# Patient Record
Sex: Male | Born: 1960 | Race: White | Hispanic: No | Marital: Married | Smoking: Never smoker
Health system: Southern US, Community
[De-identification: ages and names within clinical notes are randomized; demographics above are authoritative.]

## PROBLEM LIST (undated history)

## (undated) ENCOUNTER — Inpatient Hospital Stay: Admission: EM | Payer: Self-pay | Source: Home / Self Care

## (undated) DIAGNOSIS — C801 Malignant (primary) neoplasm, unspecified: Secondary | ICD-10-CM

## (undated) HISTORY — PX: CHOLECYSTECTOMY: SHX55

## (undated) HISTORY — PX: HERNIA REPAIR: SHX51

---

## 2002-03-20 ENCOUNTER — Ambulatory Visit (HOSPITAL_COMMUNITY): Admission: RE | Admit: 2002-03-20 | Discharge: 2002-03-20 | Payer: Self-pay | Admitting: Internal Medicine

## 2002-03-20 ENCOUNTER — Encounter: Payer: Self-pay | Admitting: Internal Medicine

## 2002-12-13 ENCOUNTER — Ambulatory Visit (HOSPITAL_BASED_OUTPATIENT_CLINIC_OR_DEPARTMENT_OTHER): Admission: RE | Admit: 2002-12-13 | Discharge: 2002-12-13 | Payer: Self-pay | Admitting: Orthopedic Surgery

## 2004-01-31 ENCOUNTER — Encounter: Admission: RE | Admit: 2004-01-31 | Discharge: 2004-01-31 | Payer: Self-pay | Admitting: Otolaryngology

## 2004-07-25 ENCOUNTER — Encounter: Admission: RE | Admit: 2004-07-25 | Discharge: 2004-07-25 | Payer: Self-pay | Admitting: Occupational Medicine

## 2007-05-20 ENCOUNTER — Emergency Department (HOSPITAL_COMMUNITY): Admission: EM | Admit: 2007-05-20 | Discharge: 2007-05-20 | Payer: Self-pay | Admitting: Emergency Medicine

## 2008-06-08 ENCOUNTER — Inpatient Hospital Stay (HOSPITAL_COMMUNITY): Admission: AC | Admit: 2008-06-08 | Discharge: 2008-06-25 | Payer: Self-pay

## 2008-06-15 ENCOUNTER — Ambulatory Visit: Payer: Self-pay | Admitting: Surgery

## 2008-06-15 ENCOUNTER — Encounter (INDEPENDENT_AMBULATORY_CARE_PROVIDER_SITE_OTHER): Payer: Self-pay | Admitting: General Surgery

## 2008-07-30 ENCOUNTER — Ambulatory Visit (HOSPITAL_COMMUNITY): Admission: RE | Admit: 2008-07-30 | Discharge: 2008-07-30 | Payer: Self-pay | Admitting: Orthopaedic Surgery

## 2008-08-27 ENCOUNTER — Encounter: Admission: RE | Admit: 2008-08-27 | Discharge: 2008-11-25 | Payer: Self-pay | Admitting: Orthopaedic Surgery

## 2008-09-07 ENCOUNTER — Ambulatory Visit (HOSPITAL_COMMUNITY): Admission: RE | Admit: 2008-09-07 | Discharge: 2008-09-08 | Payer: Self-pay | Admitting: Orthopaedic Surgery
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
## 2010-04-27 ENCOUNTER — Encounter: Payer: Self-pay | Admitting: Orthopaedic Surgery

## 2010-07-14 LAB — COMPREHENSIVE METABOLIC PANEL
AST: 33 U/L (ref 0–37)
Albumin: 3.7 g/dL (ref 3.5–5.2)
Alkaline Phosphatase: 149 U/L — ABNORMAL HIGH (ref 39–117)
BUN: 13 mg/dL (ref 6–23)
Chloride: 109 mEq/L (ref 96–112)
GFR calc Af Amer: >60 mL/min (ref >60)
Potassium: 3.9 mEq/L (ref 3.5–5.1)
Total Bilirubin: 0.8 mg/dL (ref 0.3–1.2)
Total Protein: 6.6 g/dL (ref 6.0–8.3)

## 2010-07-14 LAB — DIFFERENTIAL
Basophils Absolute: 0 10*3/uL (ref 0.0–0.1)
Eosinophils Relative: 3 % (ref 0–5)
Lymphocytes Relative: 25 % (ref 12–46)
Monocytes Absolute: 0.5 10*3/uL (ref 0.1–1.0)
Monocytes Relative: 14 % — ABNORMAL HIGH (ref 3–12)
Neutro Abs: 2.3 10*3/uL (ref 1.7–7.7)

## 2010-07-14 LAB — CBC
HCT: 40.6 % (ref 39.0–52.0)
Platelets: 218 10*3/uL (ref 150–400)
RDW: 12.7 % (ref 11.5–15.5)
WBC: 3.9 10*3/uL — ABNORMAL LOW (ref 4.0–10.5)

## 2010-07-14 LAB — APTT: aPTT: 27 seconds (ref 24–37)

## 2010-07-16 LAB — DIFFERENTIAL
Basophils Absolute: 0 10*3/uL (ref 0.0–0.1)
Basophils Relative: 1 % (ref 0–1)
Lymphocytes Relative: 25 % (ref 12–46)
Monocytes Absolute: 0.7 10*3/uL (ref 0.1–1.0)
Neutro Abs: 2.7 10*3/uL (ref 1.7–7.7)
Neutrophils Relative %: 56 % (ref 43–77)

## 2010-07-16 LAB — URINALYSIS, ROUTINE W REFLEX MICROSCOPIC
Bilirubin Urine: NEGATIVE
Ketones, ur: NEGATIVE mg/dL
Nitrite: NEGATIVE
Protein, ur: NEGATIVE mg/dL
pH: 7 (ref 5.0–8.0)

## 2010-07-16 LAB — COMPREHENSIVE METABOLIC PANEL
Alkaline Phosphatase: 130 U/L — ABNORMAL HIGH (ref 39–117)
BUN: 13 mg/dL (ref 6–23)
CO2: 26 mEq/L (ref 19–32)
Chloride: 106 mEq/L (ref 96–112)
Creatinine, Ser: 0.54 mg/dL (ref 0.4–1.5)
GFR calc non Af Amer: >60 mL/min (ref >60)
Glucose, Bld: 99 mg/dL (ref 70–99)
Potassium: 3.8 mEq/L (ref 3.5–5.1)
Total Bilirubin: 0.7 mg/dL (ref 0.3–1.2)

## 2010-07-16 LAB — APTT: aPTT: 27 seconds (ref 24–37)

## 2010-07-16 LAB — CBC
HCT: 43.3 % (ref 39.0–52.0)
Hemoglobin: 14.4 g/dL (ref 13.0–17.0)
MCV: 95.3 fL (ref 78.0–100.0)
WBC: 4.9 10*3/uL (ref 4.0–10.5)

## 2010-07-16 LAB — PROTIME-INR
INR: 1.1 (ref 0.00–1.49)
Prothrombin Time: 15 seconds (ref 11.6–15.2)

## 2010-07-17 LAB — TYPE AND SCREEN: ABO/RH(D): O POS

## 2010-07-17 LAB — CBC
HCT: 18.9 % — ABNORMAL LOW (ref 39.0–52.0)
HCT: 25 % — ABNORMAL LOW (ref 39.0–52.0)
HCT: 26.8 % — ABNORMAL LOW (ref 39.0–52.0)
HCT: 26.9 % — ABNORMAL LOW (ref 39.0–52.0)
HCT: 30 % — ABNORMAL LOW (ref 39.0–52.0)
Hemoglobin: 10.7 g/dL — ABNORMAL LOW (ref 13.0–17.0)
Hemoglobin: 6.7 g/dL — CL (ref 13.0–17.0)
Hemoglobin: 6.8 g/dL — CL (ref 13.0–17.0)
Hemoglobin: 8.8 g/dL — ABNORMAL LOW (ref 13.0–17.0)
Hemoglobin: 8.8 g/dL — ABNORMAL LOW (ref 13.0–17.0)
Hemoglobin: 9.5 g/dL — ABNORMAL LOW (ref 13.0–17.0)
Hemoglobin: 9.5 g/dL — ABNORMAL LOW (ref 13.0–17.0)
MCHC: 35.3 g/dL (ref 30.0–36.0)
MCHC: 35.3 g/dL (ref 30.0–36.0)
MCHC: 35.8 g/dL (ref 30.0–36.0)
MCV: 92 fL (ref 78.0–100.0)
MCV: 92 fL (ref 78.0–100.0)
MCV: 92.4 fL (ref 78.0–100.0)
MCV: 93.5 fL (ref 78.0–100.0)
MCV: 97.4 fL (ref 78.0–100.0)
Platelets: 130 10*3/uL — ABNORMAL LOW (ref 150–400)
Platelets: 158 10*3/uL (ref 150–400)
Platelets: 209 10*3/uL (ref 150–400)
Platelets: 85 10*3/uL — ABNORMAL LOW (ref 150–400)
RBC: 2.05 MIL/uL — ABNORMAL LOW (ref 4.22–5.81)
RBC: 3.28 MIL/uL — ABNORMAL LOW (ref 4.22–5.81)
RBC: 4.03 MIL/uL — ABNORMAL LOW (ref 4.22–5.81)
RDW: 13.6 % (ref 11.5–15.5)
RDW: 13.7 % (ref 11.5–15.5)
RDW: 13.8 % (ref 11.5–15.5)
RDW: 13.9 % (ref 11.5–15.5)
RDW: 14 % (ref 11.5–15.5)
RDW: 14.1 % (ref 11.5–15.5)
WBC: 14.1 10*3/uL — ABNORMAL HIGH (ref 4.0–10.5)
WBC: 6.8 10*3/uL (ref 4.0–10.5)
WBC: 7 10*3/uL (ref 4.0–10.5)
WBC: 7.3 10*3/uL (ref 4.0–10.5)

## 2010-07-17 LAB — BASIC METABOLIC PANEL
BUN: 10 mg/dL (ref 6–23)
BUN: 8 mg/dL (ref 6–23)
BUN: 8 mg/dL (ref 6–23)
CO2: 26 mEq/L (ref 19–32)
Calcium: 7.2 mg/dL — ABNORMAL LOW (ref 8.4–10.5)
Calcium: 7.5 mg/dL — ABNORMAL LOW (ref 8.4–10.5)
Chloride: 104 mEq/L (ref 96–112)
Chloride: 106 mEq/L (ref 96–112)
Creatinine, Ser: 0.63 mg/dL (ref 0.4–1.5)
GFR calc Af Amer: >60 mL/min (ref >60)
GFR calc non Af Amer: >60 mL/min (ref >60)
GFR calc non Af Amer: >60 mL/min (ref >60)
GFR calc non Af Amer: >60 mL/min (ref >60)
GFR calc non Af Amer: >60 mL/min (ref >60)
Glucose, Bld: 111 mg/dL — ABNORMAL HIGH (ref 70–99)
Glucose, Bld: 115 mg/dL — ABNORMAL HIGH (ref 70–99)
Glucose, Bld: 117 mg/dL — ABNORMAL HIGH (ref 70–99)
Glucose, Bld: 122 mg/dL — ABNORMAL HIGH (ref 70–99)
Potassium: 3.8 mEq/L (ref 3.5–5.1)
Potassium: 4.1 mEq/L (ref 3.5–5.1)
Sodium: 130 mEq/L — ABNORMAL LOW (ref 135–145)
Sodium: 136 mEq/L (ref 135–145)
Sodium: 136 mEq/L (ref 135–145)

## 2010-07-17 LAB — BLOOD GAS, ARTERIAL
Bicarbonate: 19.6 mEq/L — ABNORMAL LOW (ref 20.0–24.0)
FIO2: 0.5 %
Patient temperature: 98.6
TCO2: 20.9 mmol/L (ref 0–100)
pCO2 arterial: 41 mmHg (ref 35.0–45.0)
pH, Arterial: 7.301 — ABNORMAL LOW (ref 7.350–7.450)

## 2010-07-17 LAB — POCT I-STAT EG7
Bicarbonate: 26.5 meq/L — ABNORMAL HIGH (ref 20.0–24.0)
Hemoglobin: 7.8 g/dL — CL (ref 13.0–17.0)
O2 Saturation: 66 %
TCO2: 28 mmol/L (ref 0–100)
pCO2, Ven: 35.8 mmHg — ABNORMAL LOW (ref 45.0–50.0)
pH, Ven: 7.477 — ABNORMAL HIGH (ref 7.250–7.300)
pO2, Ven: 32 mmHg (ref 30.0–45.0)

## 2010-07-17 LAB — DIFFERENTIAL
Basophils Absolute: 0 10*3/uL (ref 0.0–0.1)
Lymphocytes Relative: 9 % — ABNORMAL LOW (ref 12–46)
Monocytes Absolute: 0.7 10*3/uL (ref 0.1–1.0)
Neutro Abs: 5.4 10*3/uL (ref 1.7–7.7)
Neutrophils Relative %: 80 % — ABNORMAL HIGH (ref 43–77)

## 2010-07-17 LAB — POCT I-STAT 7, (LYTES, BLD GAS, ICA,H+H)
Bicarbonate: 24.3 meq/L — ABNORMAL HIGH (ref 20.0–24.0)
Bicarbonate: 25.5 meq/L — ABNORMAL HIGH (ref 20.0–24.0)
Calcium, Ion: 1.1 mmol/L — ABNORMAL LOW (ref 1.12–1.32)
HCT: 19 % — ABNORMAL LOW (ref 39.0–52.0)
HCT: 24 % — ABNORMAL LOW (ref 39.0–52.0)
Hemoglobin: 8.2 g/dL — ABNORMAL LOW (ref 13.0–17.0)
Hemoglobin: 9.2 g/dL — ABNORMAL LOW (ref 13.0–17.0)
O2 Saturation: 100 %
O2 Saturation: 100 %
Patient temperature: 37
Potassium: 3.8 meq/L (ref 3.5–5.1)
Potassium: 4 meq/L (ref 3.5–5.1)
Potassium: 4 meq/L (ref 3.5–5.1)
Sodium: 142 meq/L (ref 135–145)
TCO2: 26 mmol/L (ref 0–100)
pCO2 arterial: 44.3 mmHg (ref 35.0–45.0)
pCO2 arterial: 51 mmHg — ABNORMAL HIGH (ref 35.0–45.0)
pO2, Arterial: 468 mmHg — ABNORMAL HIGH (ref 80.0–100.0)

## 2010-07-17 LAB — ABO/RH: ABO/RH(D): O POS

## 2010-07-17 LAB — PREPARE FRESH FROZEN PLASMA

## 2010-07-17 LAB — PROTIME-INR
INR: 1.2 (ref 0.00–1.49)
INR: 1.3 (ref 0.00–1.49)
INR: 1.3 (ref 0.00–1.49)
INR: 1.3 (ref 0.00–1.49)
INR: 1.7 — ABNORMAL HIGH (ref 0.00–1.49)
INR: 1.7 — ABNORMAL HIGH (ref 0.00–1.49)
Prothrombin Time: 15.9 seconds — ABNORMAL HIGH (ref 11.6–15.2)
Prothrombin Time: 16.7 seconds — ABNORMAL HIGH (ref 11.6–15.2)
Prothrombin Time: 17 s — ABNORMAL HIGH (ref 11.6–15.2)
Prothrombin Time: 20.4 seconds — ABNORMAL HIGH (ref 11.6–15.2)
Prothrombin Time: 21 seconds — ABNORMAL HIGH (ref 11.6–15.2)
Prothrombin Time: 24.3 s — ABNORMAL HIGH (ref 11.6–15.2)

## 2010-07-17 LAB — URINALYSIS, ROUTINE W REFLEX MICROSCOPIC
Bilirubin Urine: NEGATIVE
Ketones, ur: NEGATIVE mg/dL
Nitrite: NEGATIVE
Protein, ur: NEGATIVE mg/dL
Urobilinogen, UA: 2 mg/dL — ABNORMAL HIGH (ref 0.0–1.0)

## 2010-07-17 LAB — POCT I-STAT 4, (NA,K, GLUC, HGB,HCT)
Glucose, Bld: 103 mg/dL — ABNORMAL HIGH (ref 70–99)
HCT: 22 % — ABNORMAL LOW (ref 39.0–52.0)
Sodium: 135 meq/L (ref 135–145)

## 2010-07-17 LAB — COMPREHENSIVE METABOLIC PANEL
Alkaline Phosphatase: 26 U/L — ABNORMAL LOW (ref 39–117)
BUN: 21 mg/dL (ref 6–23)
Chloride: 109 mEq/L (ref 96–112)
Glucose, Bld: 181 mg/dL — ABNORMAL HIGH (ref 70–99)
Potassium: 4.3 mEq/L (ref 3.5–5.1)
Total Bilirubin: 1.2 mg/dL (ref 0.3–1.2)

## 2010-07-17 LAB — APTT
aPTT: 30 seconds (ref 24–37)
aPTT: 35 s (ref 24–37)

## 2010-07-17 LAB — POCT I-STAT, CHEM 8
HCT: 41 % (ref 39.0–52.0)
Hemoglobin: 13.9 g/dL (ref 13.0–17.0)
Potassium: 3.1 meq/L — ABNORMAL LOW (ref 3.5–5.1)
Sodium: 141 meq/L (ref 135–145)
TCO2: 22 mmol/L (ref 0–100)

## 2010-07-17 LAB — CULTURE, BLOOD (ROUTINE X 2): Culture: NO GROWTH
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
## 2010-08-19 NOTE — Op Note (Signed)
NAMESHAWN, CARATTINI NO.:  [id number]   MEDICAL RECORD NO.:  [id number]          PATIENT TYPE:  INP   LOCATION:  3312                         FACILITY:  [facility]   PHYSICIAN:  Mark C. Ophelia Charter, M.D.    DATE OF BIRTH:  1960/11/25   DATE OF PROCEDURE:  06/11/2008  DATE OF DISCHARGE:                               OPERATIVE REPORT   PREOPERATIVE DIAGNOSES:  Four-extremity trauma with left tibial plateau  fracture, depressed, grade 2 open tibia-fibula, status post incision and  drainage and spanning external fixator.   POSTOPERATIVE DIAGNOSES:  Four-extremity trauma with left tibial plateau  fracture, depressed, grade 2 open tibia-fibula, status post incision and  drainage and spanning external fixator.   PROCEDURE:  1. Removal of left lower extremity spanning external fixator.  2. Open reduction and internal fixation of tibial plateau with      cancellous bone chips and screw fixation.  3. Tibial interlocking nail, Synthes 10-mm nail with proximal and      distal interlocks.  4. Three proximal and two distal interlocks.   PROCEDURE:  After induction of general anesthesia, the patient was  placed on the Ralston table, flat.  Standard prepping and drape was  performed after a proximal thigh tourniquet had been applied.  The leg  was prepped with external fixator was on due to the multiple comminuted  unstable fractures and after this was done, a sterile sheath was placed  underneath.  A surgical time-out checklist was completed and Ancef had  been given, 2 g.  External fixator was then removed and further prepping  was performed and then split extremity sheet and sterile glove over the  toes.  C-arm was used for stabilization and a triangle was placed  underneath the knee with careful padding.  Leg was wrapped with an  Esmarch and tourniquet was inflated.  Incision was made adjacent to the  medial aspect of the patella and entry point was started after a K-wire  was  drilled for 6.5 cancellous titanium lag screw placed from slightly  anterolateral to posteromedial.  The K-wire was drilled initially to  help hold the bicondylar fragment together.  Next, tibial nail was  performed in a standard fashion.  A 10-mm was selected due to the  comminution and with the multiple comminution the previous I&'D fracture  site was reopened removing the 3 sutures and open visualization was used  to pass the K-wire across the fracture site.  A 10-mm nail would not  pass into the distal fragment and reaming had to be performed at 9, 10,  and 10.5 mm.  Fracture fragments held together with large bone clamp and  then nail was passed across.  Proximal fragment had an oblique fracture.  This split the anterior and posterior aspects of the tibia.  Attempt was  made to run a cannulated screw from anterior to posterior, lagging front  and back pieces to add some additional stability.  Transverse interlocks  were placed through the nail proximally both static and attempt at the  anterior-posterolateral cannulated screw adjacent to the nail actually  fell onto the  fracture site and had to be retrieved by hand since it  countersunk down into the tibia.  Screw was removed.  Attempt was made  to do it on the medial side.  There was minimal room next to the nail  and finally a screw was placed from distal medial starting just below  the pass and then going back behind the rod to proximal lateral, lagging  the anterior-posterior screws and then another interlock was placed  through the rod itself anterior and posterior for a total of 3  interlocks proximally.  There was still crepitus of the patella.  C-arm  was used to look at the undersurface of the patella.  No definite  fracture was seen, but by exam it was obvious that the patient probably  has a small chondral fracture from the grading with palpation.  Distal  interlocks were placed, but with the proximal instability of the rod,  it  twisted some and the distal interlocks were placed and being  posteromedial coming to anterolateral, care was taken to make sure  neurovascular bundle was carefully protected.  One superficial vein just  underneath the skin had to be coagulated.  Two distal static interlocks  were placed.  Final spot pictures were taken and the distal interlocks  were placed after the leg had traction applied.  The leg was out to  length.  The distal interlocks looked good.  The proximal cannulated  screw was placed, lagging together 2 condyles and prior to placing the  rod through the open area of some bone graft was packed up through the  normal guide that was used to place an interlock.  This was placed  retrograde up tibia and then removing the centerpiece, bone pieces were  packed into the middle of the cannula and then advanced with a trocar  under AP and lateral fluoroscopy showing the area where there was  depression and packing this up until the depression was corrected and it  was backed anatomic where there was no depression segment.  Fluoroscopic  pictures were taken showing the cancellous bone graft packed into  position and the screw was placed.  The patient tolerated the procedure  well.  Tourniquet was deflated 2 hours.  All areas were closed.  The  nail insertion site was closed with 0 Vicryl and the fascia 2-0, and  subcutaneous tissue skin staple closure, skin staple closure on the  multiple other areas, and near-far-far-near in regional midportion which  was where the original open fracture had been present near-far-far-near  Xeroform, 4 x 4s, Webril, Ace wrap was applied.  Instrument count and  needle count was correct.  Surgical checklist completed and the patient  was transferred to the recovery room in stable condition.  The patient  received 2 units of blood in the OR for starting hemoglobin which was  about 7.      Mark C. Ophelia Charter, M.D.  Electronically Signed      MCY/MEDQ  D:  06/11/2008  T:  06/12/2008  Job:  [id number]
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
## 2010-08-28 ENCOUNTER — Ambulatory Visit (HOSPITAL_COMMUNITY)
Admission: RE | Admit: 2010-08-28 | Discharge: 2010-08-28 | Disposition: A | Discharge status: Home / Self Care | Payer: BC Managed Care – PPO | Source: Ambulatory Visit | Attending: Gastroenterology | Admitting: Gastroenterology

## 2010-08-28 DIAGNOSIS — Z1211 Encounter for screening for malignant neoplasm of colon: Secondary | ICD-10-CM | POA: Insufficient documentation

## 2010-09-04 NOTE — Op Note (Signed)
  NAMEMarland Kitchen  KWAN, SHELLHAMMER.:  [id number]  MEDICAL RECORD NO.:  [id number]           PATIENT TYPE:  O  LOCATION:  WLEN                         [facility]ACILITY:  [facility]  PHYSICIAN:  Danise Edge, M.D.   DATE OF BIRTH:  [birth year]  DATE OF PROCEDURE:  08/28/2010 DATE OF DISCHARGE:                              OPERATIVE REPORT   HISTORY:  Randy Kaufman is a 49 year old male born 1960-12-01. The patient is scheduled to undergo his first screening colonoscopy with polypectomy to prevent colon cancer.  ENDOSCOPIST:  Danise Edge, M.D.  PREMEDICATION:  Fentanyl 100 mcg, Versed 10 mg.  PROCEDURE:  After obtaining informed consent, the patient was placed in the left lateral decubitus position.  Anal inspection and digital rectal examination were normal.  The Pentax pediatric colonoscope was introduced into the rectum and advanced to the cecum without difficulty. A normal-appearing appendiceal orifice and ileocecal valve were identified.  Colonic preparation for the examination today was good.  Rectum normal.  Retroflex view of the distal rectum normal. Sigmoid colon and descending colon normal. Splenic flexure normal. Transverse colon normal. Hepatic flexure normal. Ascending colon normal. Cecum and ileocecal valve normal.  ASSESSMENT:  Normal baseline screening proctocolonoscopy to the cecum.  RECOMMENDATIONS:  Repeat colonoscopy in 10 years.          ______________________________ Danise Edge, M.D.     MJ/MEDQ  D:  08/28/2010  T:  08/28/2010  Job:  [id number]  cc:   Pearla Dubonnet, M.D. Fax: [phone]  Electronically Signed by Danise Edge M.D. on 09/04/2010 11:91:47 PM

## 2016-04-06 ENCOUNTER — Encounter (HOSPITAL_BASED_OUTPATIENT_CLINIC_OR_DEPARTMENT_OTHER): Payer: Self-pay | Admitting: *Deleted

## 2016-04-06 ENCOUNTER — Emergency Department (HOSPITAL_BASED_OUTPATIENT_CLINIC_OR_DEPARTMENT_OTHER)
Admission: EM | Admit: 2016-04-06 | Discharge: 2016-04-07 | Disposition: A | Discharge status: Home / Self Care | Payer: Managed Care, Other (non HMO) | Attending: Emergency Medicine | Admitting: Emergency Medicine

## 2016-04-06 ENCOUNTER — Emergency Department (HOSPITAL_BASED_OUTPATIENT_CLINIC_OR_DEPARTMENT_OTHER): Payer: Managed Care, Other (non HMO)

## 2016-04-06 DIAGNOSIS — S61412A Laceration without foreign body of left hand, initial encounter: Secondary | ICD-10-CM | POA: Diagnosis not present

## 2016-04-06 DIAGNOSIS — W312XXA Contact with powered woodworking and forming machines, initial encounter: Secondary | ICD-10-CM | POA: Insufficient documentation

## 2016-04-06 DIAGNOSIS — S66301A Unspecified injury of extensor muscle, fascia and tendon of left index finger at wrist and hand level, initial encounter: Secondary | ICD-10-CM | POA: Diagnosis not present

## 2016-04-06 DIAGNOSIS — Y9389 Activity, other specified: Secondary | ICD-10-CM | POA: Insufficient documentation

## 2016-04-06 DIAGNOSIS — Y929 Unspecified place or not applicable: Secondary | ICD-10-CM | POA: Diagnosis not present

## 2016-04-06 DIAGNOSIS — S61422A Laceration with foreign body of left hand, initial encounter: Secondary | ICD-10-CM

## 2016-04-06 DIAGNOSIS — Y990 Civilian activity done for income or pay: Secondary | ICD-10-CM | POA: Diagnosis not present

## 2016-04-06 DIAGNOSIS — M6789 Other specified disorders of synovium and tendon, multiple sites: Secondary | ICD-10-CM

## 2016-04-06 MED ORDER — LIDOCAINE HCL 2 % IJ SOLN
INTRAMUSCULAR | Status: AC
  Filled 2016-04-06: qty 20

## 2016-04-06 MED ORDER — CEPHALEXIN 500 MG PO CAPS: 500.0000 mg | ORAL_CAPSULE | Freq: Two times a day (BID) | ORAL | 0 refills | Status: AC

## 2016-04-06 MED ORDER — HYDROCODONE-ACETAMINOPHEN 5-325 MG PO TABS
1.0000 | ORAL_TABLET | Freq: Once | ORAL | Status: AC
  Administered 2016-04-06: 1 via ORAL
  Filled 2016-04-06: qty 1

## 2016-04-06 MED ORDER — CEPHALEXIN 250 MG PO CAPS
500.0000 mg | ORAL_CAPSULE | Freq: Once | ORAL | Status: AC
  Administered 2016-04-06: 500 mg via ORAL
  Filled 2016-04-06: qty 2

## 2016-04-06 MED ORDER — LIDOCAINE HCL 1 % IJ SOLN
INTRAMUSCULAR | Status: AC
  Filled 2016-04-06: qty 20

## 2016-04-06 NOTE — ED Notes (Signed)
Pt states he has a velcro thumb spica at home and does not need another one. PA notified and is okay with it.

## 2016-04-06 NOTE — ED Provider Notes (Signed)
[facility] Provider Note   CSN: [id number] Arrival date & time: 04/06/16  1703  By signing my name below, I, Randy Kaufman, attest that this documentation has been prepared under the direction and in the presence of [name], PA-C. Electronically Signed: Linna Kaufman, Scribe. 04/06/2016. 8:46 PM.  History   Chief Complaint Chief Complaint  Patient presents with  . Extremity Laceration    The history is provided by the patient. No language interpreter was used.    HPI Comments: Randy Kaufman is a 56 y.o. male who presents to the Emergency Department complaining of a left hand laceration sustained about 4 hours ago. He states he was working with a high-speed saw and it "kicked", lacerating his left hand. He notes he was sawing metal and is not concerned for foreign body. He states his bleeding was significant at first but has slowed and is now controlled. He notes he irrigated the wound with water PTA. He reports intermittent pain secondary to the wound and notes the pain is worse with movement of his left hand. He reports he is able to make a fist with his left hand. No h/o immunocompromising conditions. No regular medications. No anticoagulants. Tetanus UTD. He denies fever, chills, numbness, or any other associated symptoms.   History reviewed. No pertinent past medical history.  There are no active problems to display for this patient.   Past Surgical History:  Procedure Laterality Date  . CHOLECYSTECTOMY    . HERNIA REPAIR         Home Medications    Prior to Admission medications   Medication Sig Start Date End Date Taking? Authorizing Provider  cephALEXin (KEFLEX) 500 MG capsule Take 1 capsule (500 mg total) by mouth 2 (two) times daily. 04/06/16 04/11/16  [name], PA-C    Family History History reviewed. No pertinent family history.  Social History Social History  Substance Use Topics  . Smoking status: Never Smoker  . Smokeless  tobacco: Not on file  . Alcohol use No     Allergies   Patient has no known allergies.   Review of Systems Review of Systems  Constitutional: Negative for chills and fever.  Musculoskeletal: Positive for myalgias (left hand secondary to laceration).  Skin: Positive for wound (left hand laceration).  Neurological: Negative for numbness.     Physical Exam Updated Vital Signs BP 141/82 (BP Location: Right Arm)   Pulse (!) 57   Temp 98 F (36.7 C)   Resp 18   Ht 6' (1.829 m)   Wt 92.5 kg   SpO2 100%   BMI 27.67 kg/m   Physical Exam  Constitutional: He appears well-developed and well-nourished. No distress.  HENT:  Head: Normocephalic and atraumatic.  Eyes: Conjunctivae are normal. No scleral icterus.  Neck: Normal range of motion.  Cardiovascular: Normal rate and intact distal pulses.   Pulmonary/Chest: Effort normal. No respiratory distress.  Abdominal: He exhibits no distension.  Musculoskeletal:  Left hand: 2cm to dorsal surface between first and second digit. Partial disruption of extensor tendon present. Bleeding controlled. Able to make a fist. Able to fully extend digits although painful. Strength and sensation intact. Capillary refill <3seconds.  Neurological: He is alert.  Sensation intact.  Skin: Skin is warm and dry. Laceration noted. He is not diaphoretic.  Psychiatric: He has a normal mood and affect. His behavior is normal.     ED Treatments / Results  Labs (all labs ordered are listed, but only abnormal results  are displayed) Labs Reviewed - No data to display  EKG  EKG Interpretation None       Radiology Dg Hand Complete Left  Result Date: 04/06/2016 CLINICAL DATA:  Laceration to the dorsal surface between the first and second digit of left hand. The patient was using an electric saw. EXAM: LEFT HAND - COMPLETE 3+ VIEW COMPARISON:  None. FINDINGS: There is no evidence of fracture or dislocation. There are three 1 mm radiopaque densities in  the soft tissues at the second metacarpal phalangeal space, suspicious for radiopaque foreign body. Chronic posttraumatic changes of the distal ulna and radius are noted. IMPRESSION: No acute fracture or dislocation. Three 1 mm radiopaque densities in the soft tissues at the second metacarpal phalangeal space, suspicious for radiopaque foreign body. Electronically Signed   By: Sherian Rein M.D.   On: 04/06/2016 21:15    Procedures Procedures (including critical care time)  LACERATION REPAIR Performed by: [name] Authorized by: [name] Consent: Verbal consent obtained. Risks and benefits: risks, benefits and alternatives were discussed Consent given by: patient Patient identity confirmed: provided demographic data Prepped and Draped in normal sterile fashion Wound explored  Laceration Location: dorsal surface of left hand between 1st and 2nd digit   Laceration Length: 2cm  No Foreign Bodies seen or palpated  Anesthesia: local infiltration  Local anesthetic: lidocaine 2% w/o epinephrine  Anesthetic total: 4 ml  Irrigation method: syringe Amount of cleaning: standard  Skin closure: dermal  Number of sutures: 5  Technique: simple interrupted, vicryl  Patient tolerance: Patient tolerated the procedure well with no immediate complications.  DIAGNOSTIC STUDIES: Oxygen Saturation is 100% on RA, normal by my interpretation.    COORDINATION OF CARE: 8:56 PM Discussed treatment plan with pt at bedside and pt agreed to plan.  Medications Ordered in ED Medications  lidocaine (XYLOCAINE) 1 % (with pres) injection (not administered)  lidocaine (XYLOCAINE) 2 % (with pres) injection (not administered)  HYDROcodone-acetaminophen (NORCO/VICODIN) 5-325 MG per tablet 1 tablet (1 tablet Oral Given 04/06/16 2107)  cephALEXin (KEFLEX) capsule 500 mg (500 mg Oral Given 04/06/16 2355)     Initial Impression / Assessment and Plan / ED Course  I have reviewed the  triage vital signs and the nursing notes.  Pertinent labs & imaging results that were available during my care of the patient were reviewed by me and considered in my medical decision making (see chart for details).  Clinical Course     Patient presents to ED s/p laceration via power saw PTA. Bleeding is controlled. No anti-coagulation therapy. No numbness. No immunocompromising conditions. Patient is afebrile and non-toxic appearing in NAD. VSS. Tetanus UTD. Partial disruption of extensor tendon noted; ROM, strength, sensation intact. Capillary refill intact. Imaging obtained - no fracture or dislocation; 3 radiopaque densities at 2nd metacarpal phalangeal joint. Wound explored and base of wound visualized in a bloodless field unable to visualize any foreign body. Copious pressure irrigation performed by me. Consult to Hand given physical exam findings. Patient discussed with Dr. Verdie Mosher, who also evaluated patient, agrees with plan.  Dr. Verdie Mosher spoke with Dr. Janee Morn of Hand Surgery, greatly appreciated his time and input. Recommended approximate closure, splinting, and f/u in office. Thank you for your continued care of this patient.    Discussed results and plan with pt. Laceration occurred < 8 hours prior to repair which was well tolerated. Pt discharged on ABX given tendon disruption. Discussed suture home care with patient and answered questions. Pt to follow  up with Hand Surgery for further management. Return to ED if signs of infection. Pt voiced understanding and is agreeable.   Final Clinical Impressions(s) / ED Diagnoses     Final diagnoses:  Laceration of left hand with foreign body, initial encounter  Extensor tendon disruption    New Prescriptions Discharge Medication List as of 04/06/2016 11:47 PM    START taking these medications   Details  cephALEXin (KEFLEX) 500 MG capsule Take 1 capsule (500 mg total) by mouth 2 (two) times daily., Starting Mon 04/06/2016, Until Sat 04/11/2016, Print        I personally performed the services described in this documentation, which was scribed in my presence. The recorded information has been reviewed and is accurate.    Lona KettleAshley Laurel Savoy [place], PA-C 04/07/16 0154    Lavera Guiseana Duo Liu, MD 04/07/16 [phone]

## 2016-04-06 NOTE — ED Triage Notes (Signed)
C/o lac to left hand from power saw x 45 mins ago

## 2016-04-06 NOTE — Discharge Instructions (Signed)
Read the information below.  X-ray did not show any acute fracture or dislocation; there is a possible foreign body, none was visualized on exam. You have a partial disruption of your tendon. Hand Surgery was consulted and recommended follow up in office. Please call them in the morning to schedule a follow up appointment.  Your wound was closed and a splint applied. Please wear splint until evaluated by Hand.  I have prescribed antibiotics in prevention of infection. Please take as directed.  You can take tylenol/motrin for pain relief.  Return to ED if develop fever, redness, swelling, warmth, purulent drainage, streaking up arm, or any other new/concerning symptoms.  Use the prescribed medication as directed.  Please discuss all new medications with your pharmacist.   You may return to the Emergency Department at any time for worsening condition or any new symptoms that concern you.

## 2023-04-08 ENCOUNTER — Other Ambulatory Visit (HOSPITAL_COMMUNITY): Payer: Self-pay | Admitting: Otolaryngology

## 2023-04-08 DIAGNOSIS — C4492 Squamous cell carcinoma of skin, unspecified: Secondary | ICD-10-CM

## 2023-04-15 ENCOUNTER — Encounter (HOSPITAL_COMMUNITY)
Admission: RE | Admit: 2023-04-15 | Discharge: 2023-04-15 | Disposition: A | Discharge status: Home / Self Care | Payer: No Typology Code available for payment source | Source: Ambulatory Visit | Attending: Otolaryngology | Admitting: Otolaryngology

## 2023-04-15 DIAGNOSIS — C770 Secondary and unspecified malignant neoplasm of lymph nodes of head, face and neck: Secondary | ICD-10-CM

## 2023-04-15 DIAGNOSIS — C4492 Squamous cell carcinoma of skin, unspecified: Secondary | ICD-10-CM | POA: Insufficient documentation

## 2023-04-15 LAB — GLUCOSE, CAPILLARY: Glucose-Capillary: 80 mg/dL (ref 70–99)

## 2023-04-15 MED ORDER — FLUDEOXYGLUCOSE F - 18 (FDG) INJECTION
10.1400 | Freq: Once | INTRAVENOUS | Status: AC
  Administered 2023-04-15: 10.14 via INTRAVENOUS

## 2023-04-16 NOTE — Progress Notes (Signed)
 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 Godfrey 04/02/23 consult note: Couple months ago his dentist noticed a small lump in the right neck. Within the past week or 2 it got larger.  Biopsies revealed:  04/02/2023 A.  RIGHT NECK, FINE NEEDLE ASPIRATION I (SMEARS AND THIN PREP):  Malignant cells present, consistent with metastatic squamous cell carcinoma  PET Scan  04/15/2023 --IMPRESSION: Hypermetabolic activity at the level of the RIGHT lingual tonsil consistent with primary head and neck squamous cell carcinoma. Hypermetabolic enlarged RIGHT level II lymph node consistent with nodal metastasis. The node appears inflamed. No evidence distant metastatic disease.  Nutrition Status Yes No Comments  Weight changes? []  [x]    Swallowing concerns? []  [x]    PEG? []  [x]     Referrals Yes No Comments  Social Work? [x]  []    Dentistry? [x]  []    Swallowing therapy? [x]  []    Nutrition? [x]  []    Med/Onc? [x]  []     Safety Issues Yes No Comments  Prior radiation? []  [x]    Pacemaker/ICD? []  [x]    Possible current pregnancy? []  [x]  N/A  Is the patient on methotrexate? []  [x]     Tobacco/Marijuana/Snuff/ETOH use: Denies  Past/Anticipated interventions by otolaryngology, if any:  04/06/2023 --Dr. Ida Loader (telephone encounter)  Discussed the FNA results which does show squamous cell carcinoma. I will set him up for PET scan and consultation with radiation and medical oncology.   04/05/2023 --Dr. Ida Loader  Right neck swelling and pain since the FNA. No fever, but he has had a couple of brief time periods when he felt chills. On exam there is no erythema of the skin. The right neck is swollen and slightly tender. It is not firm. Pathology not back yet. 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 I will  start him on clindamycin. He will contact me immediately if he gets any worse or if he develops any fever or chills.   04/02/2023 --Dr. Ida Loader  Fine Needle Aspiration w/o Image Guidance   Past/Anticipated interventions by medical oncology, if any:  Referral has been placed, waiting to be scheduled with provider   Current Complaints / other details:   Vitals:   04/20/23 0731  BP: (!) 168/88  Pulse: 62  Resp: 18  SpO2: 100%  Weight: 211 lb (95.7 kg)   Patients blood pressure was elevated denies any s/s of hypertension.

## 2023-04-18 NOTE — Progress Notes (Addendum)
 Radiation Oncology         [phone] ________________________________  Initial Outpatient Consultation  Name: Randy Kaufman MRN: [id number]  Date: 04/20/2023  DOB: 1960-09-17  RR:Dfpuy, Arnulfo, MD  Randy Oliphant, MD   REFERRING PHYSICIAN: [name], MD  DIAGNOSIS:  C02.4   ICD-10-CM   1. Squamous cell carcinoma of tonsils (HCC)  C09.9     2. 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 Staging pending p16 status.   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 male who presented to his dentist last year with c/o of a small lump to his right neck. He was subsequently referred to Dr. Jesus at [facility], on 04/02/23 for further evaluation. Physical exam performed at that time noted a palpable 4 cm right level 2 neck mass. No other masses were palpated.   A biopsy of the right neck mass was collected that date (03/23/23) and showed findings consistent with metastatic squamous cell carcinoma. P16 status does not appear to have been done.   Pertinent imaging thus far includes a PET scan on 04/14/22 revealing hypermetabolic activity at the right lingual tonsil and right level II node. The enlarged lymph node appeared to measure approximately 4-5 cm in greatest dimension. No evidence of distant metastatic disease was seen.   Swallowing issues, if any: none  Weight Changes: none  Pain status: none  Other symptoms: enlarging right neck mass   Tobacco history, if any: Patient has never personally smoked tobacco. 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   ETOH use: He reports that he recently had moderate alcohol use, including beer  and vodka, but recently quit given his diagnosis.  Prior cancers, if any: none  He is accompanied by his supportive significant other.  He currently works and his work is mainly office based.  He believes this can be performed at least part-time from home.  PREVIOUS RADIATION THERAPY: No  PAST MEDICAL HISTORY:  has no past medical history on file.    PAST SURGICAL HISTORY: Past Surgical History:  Procedure Laterality Date   CHOLECYSTECTOMY     HERNIA REPAIR      FAMILY HISTORY: family history is not on file.  SOCIAL HISTORY:  reports that he has never smoked. He does not have any smokeless tobacco history on file. He reports that he does not drink alcohol and does not use drugs.  ALLERGIES: Patient has no known allergies.  MEDICATIONS:  Current Outpatient Medications  Medication Sig Dispense Refill   finasteride (PROSCAR) 5 MG tablet Takes 2.5 mg, patient cuts in half     No current facility-administered medications for this encounter.    REVIEW OF SYSTEMS:  Notable for that above.   PHYSICAL EXAM:  weight is 211 lb (95.7 kg). His blood pressure is 168/88 (abnormal) and his pulse is 62. His respiration is 18 and oxygen saturation is 100%.   General: Alert and oriented, in no acute distress HEENT: Head is normocephalic. Extraocular movements are intact. Oropharynx is notable for slightly enlarged right tonsil. Patient has good dentition, natural teeth in place.  Neck: Neck is notable for hard, fixed right cervical  node, measuring approximately 4 cm in greatest dimension.  Heart: Regular in rate and rhythm with no murmurs, rubs, or gallops. Chest: Clear to auscultation bilaterally, with no rhonchi, wheezes, or rales. Abdomen: Soft, nontender, nondistended, with no rigidity or guarding. Extremities: No cyanosis or edema. Lymphatics: see Neck Exam Skin: No concerning lesions. Musculoskeletal: symmetric strength and muscle tone throughout. Neurologic: Cranial nerves II through  XII are grossly intact. No obvious focalities. Speech is fluent. Coordination is intact. Psychiatric: Judgment and insight are intact. Affect is appropriate.   ECOG = 0  0 - Asymptomatic (Fully active, able to carry on all predisease activities without restriction)  1 - Symptomatic but completely ambulatory (Restricted in physically strenuous activity but ambulatory and able to carry out work of a light or sedentary nature. For example, light housework, office work)  2 - Symptomatic, <50% in bed during the day (Ambulatory and capable of all self care but unable to carry out any work activities. Up and about more than 50% of waking hours)  3 - Symptomatic, >50% in bed, but not bedbound (Capable of only limited self-care, confined to bed or chair 50% or more of waking hours)  4 - Bedbound (Completely disabled. Cannot carry on any self-care. Totally confined to bed or chair)  5 - Death   Randy Kaufman, Randy Kaufman, Randy Kaufman, et al. [phone]). Toxicity and response criteria of the [facility]. Am. Randy Kaufman. Oncol. 5 (6): 649-55   LABORATORY DATA:  Lab Results  Component Value Date   WBC 3.9 (L) 09/07/2008   HGB 13.7 09/07/2008   HCT 40.6 09/07/2008   MCV 94.1 09/07/2008   PLT 218 09/07/2008   CMP     Component Value Date/Time   NA 142 09/07/2008 0802   K 3.9 09/07/2008 0802   CL 109 09/07/2008 0802   CO2 25 09/07/2008 0802   GLUCOSE 95 09/07/2008 0802   BUN 13 09/07/2008 0802   CREATININE 0.55 09/07/2008 0802   CALCIUM 9.1 09/07/2008 0802   PROT 6.6 09/07/2008 0802   ALBUMIN 3.7 09/07/2008 0802   AST 33 09/07/2008 0802   ALT 53 09/07/2008 0802   ALKPHOS 149 (H) 09/07/2008 0802   BILITOT 0.8 09/07/2008 0802   GFRNONAA >60 09/07/2008 0802   GFRAA  09/07/2008 0802    >60        The eGFR has been calculated using the MDRD equation. This calculation has not been validated in all clinical situations. eGFR's persistently <60 mL/min signify possible Chronic  Kidney Disease.      No results found for: TSH   RADIOGRAPHY: NM PET Image Initial (PI) Skull Base To Thigh (F-18 FDG) Result Date: 04/15/2023 CLINICAL DATA:  Initial treatment strategy for squamous cell carcinoma head neck. EXAM: NUCLEAR MEDICINE PET SKULL BASE TO THIGH TECHNIQUE: 10.1 mCi F-18 FDG was injected intravenously. Full-ring PET imaging was performed from the skull base to thigh after the radiotracer. CT data was obtained and used for attenuation correction and anatomic localization. Fasting blood glucose: 80 mg/dl COMPARISON:  None FINDINGS: NECK: Asymmetric hypermetabolic activity at the level of the RIGHT lingual tonsil with SUV max equal 10 8 image 28. There is asymmetric thickening associated metabolic activity measuring 10 Kaufman x 8 Kaufman on image 29/4). Activity is localized to mucosal surface Hypermetabolic enlarged RIGHT level 2 lymph node measures 2.6 cm on image 37. The central portion of this enlarged node is hypermetabolic with SUV max equal 11.6. the node appears inflamed. Incidental CT findings:  None. CHEST: No hypermetabolic mediastinal or hilar nodes. No suspicious pulmonary nodules on the CT scan. Incidental CT findings: None. ABDOMEN/PELVIS: No abnormal hypermetabolic activity within the liver, pancreas, adrenal glands, or spleen. No hypermetabolic lymph nodes in the abdomen or pelvis. Incidental CT findings: Postcholecystectomy. Hernia repair ventral peritoneal space above the pubic symphysis. SKELETON: Healed fracture of the LEFT inferior pubic ramus. No suspicious lesion Incidental CT findings: None. IMPRESSION: 1. Hypermetabolic activity at the level of the RIGHT lingual tonsil consistent with primary head and neck squamous cell carcinoma. 2. Hypermetabolic enlarged RIGHT level II lymph node consistent with nodal metastasis. The node appears inflamed. 3. No evidence distant metastatic disease. Electronically Signed   By: Jackquline Boxer M.D.   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   This is a delightful patient with head and neck cancer. Request for p16 status has been placed today. Given the size and nature of the lymph node with concern for extracapsular extension, he has a high likelihood of requiring adjuvant chemoradiation if removed surgically. Therefore, Dr. Izell expects a 7-week course of curative radiation, administered concurrently with chemotherapy, if deemed eligible. Radiation will be directed at the right tonsil and bilateral neck nodes. We will discuss his case at tumor board and share our final recommendations with the patient.   We discussed the potential risks, benefits, and side effects of radiotherapy. We talked in detail about acute and late effects. We discussed that some of the most bothersome acute effects may be mucositis, dysgeusia, salivary changes, skin irritation, hair loss, dehydration, weight loss and fatigue. We talked about late effects which include but are not necessarily limited to dysphagia, hypothyroidism, nerve injury, vascular injury, spinal cord injury, xerostomia, trismus, neck edema, dental issues, non-healing wound, and potentially fatal injury to any of the tissues in the head and neck region. No guarantees of treatment were given. A consent form was signed and placed in the patient's medical record. The patient is enthusiastic about proceeding with treatment. We look forward to participating in the patient's care.    Simulation (treatment planning) will take place after final treatment decisions have been made.   We also discussed that the treatment of head and neck cancer is a multidisciplinary process to maximize treatment outcomes and quality of life. For this reason the following referrals have been or will be made:   Medical oncology to discuss chemotherapy.  Patient is scheduled to meet with Dr. Autumn on 02/20/24.   Dentistry for dental evaluation is not necessary. Referral for SPDs placed today.   We will aggressively mold the radiation away from his tooth roots to spare him from rate limiting procedures such as dental extractions prior to radiation.  The main concern for dental health in the future arises from the risk of xerostomia after treatment.  We discussed the importance of following with his community dentist closely in the long-term and using fluoride supplements especially if he develops a dry mouth   Nutritionist for nutrition support during and after treatment.   Speech language pathology for swallowing and/or speech therapy.   Social work for social support.    Physical therapy due to risk of lymphedema in neck and deconditioning.   Baseline labs including TSH.  Patient used to drink a moderate amount of alcohol.  He has quit.  Out of caution we discussed the value of abstaining  from alcohol for the rest of his life.  He seems receptive to this.   On date of service, in total, we spent 60 minutes on this encounter. Patient was seen in person.  __________________________________________    Leeroy Due, PA-C   Lauraine Golden, MD    [facility]  Radiation Oncology Direct Dial: [phone]  Fax: [phone] .com    This document serves as a record of services personally performed by Lauraine Golden, MD and Leeroy Due, PA-C. It was created on her behalf by Dorthy Fuse, a trained medical scribe. The creation of this record is based on the scribe's personal observations and the provider's statements to them. This document has been checked and approved by the attending provider.

## 2023-04-19 NOTE — Progress Notes (Deleted)
 Marland Kitchen

## 2023-04-20 ENCOUNTER — Ambulatory Visit
Admission: RE | Admit: 2023-04-20 | Discharge: 2023-04-20 | Disposition: A | Discharge status: Home / Self Care | Payer: No Typology Code available for payment source | Source: Ambulatory Visit | Attending: Radiation Oncology | Admitting: Radiation Oncology

## 2023-04-20 ENCOUNTER — Encounter: Payer: Self-pay | Admitting: Radiation Oncology

## 2023-04-20 VITALS — BP 168/88 | HR 62 | Resp 18 | Wt 211.0 lb

## 2023-04-20 DIAGNOSIS — C024 Malignant neoplasm of lingual tonsil: Secondary | ICD-10-CM | POA: Insufficient documentation

## 2023-04-20 DIAGNOSIS — R599 Enlarged lymph nodes, unspecified: Secondary | ICD-10-CM | POA: Diagnosis not present

## 2023-04-20 DIAGNOSIS — C099 Malignant neoplasm of tonsil, unspecified: Secondary | ICD-10-CM

## 2023-04-20 NOTE — Progress Notes (Signed)
 Oncology Nurse Navigator Documentation   Met with patient during initial consult with Dr. Izell. He was accompanied by his wife, [name]. Further introduced myself as his/their Navigator, explained my role as a member of the Care Team. Provided New Patient resource guide binder: Contact information for physicians, this navigator, other members of the Care Team Advance Directive information; provided [facility] AD booklet at their request,  Fall Prevention Patient Safety Plan Financial Assistance Information sheet Symptom Management Clinic information [facility] campus map with highlight of [facility] SLP Information sheet Head and Neck cancer basics Nutrition information Patient and family support information including Spiritual care/Chaplain information, Peer mentor program, health and wellness classes, and the survivorship program Community resources  Provided and discussed educational handouts for PEG and PAC. Assisted with post-consult appt scheduling. I have contacted Katie at [facility] to set up an appointment with [name] for scatter protective guard fabrication.  They verbalized understanding of information provided.He knows I will see him on Thursday when he consults with Dr. Autumn.  I encouraged them to call with questions/concerns moving forward.  Randy Jefferson, RN, BSN, OCN Head & Neck Oncology Nurse Navigator [facility] at [place] [phone]

## 2023-04-22 ENCOUNTER — Encounter: Payer: Self-pay | Admitting: Oncology

## 2023-04-22 ENCOUNTER — Inpatient Hospital Stay
Admission: RE | Admit: 2023-04-22 | Discharge: 2023-04-22 | Disposition: A | Discharge status: Home / Self Care | Payer: Self-pay | Source: Ambulatory Visit | Attending: Radiation Oncology | Admitting: Radiation Oncology

## 2023-04-22 ENCOUNTER — Inpatient Hospital Stay: Payer: No Typology Code available for payment source | Attending: Oncology

## 2023-04-22 ENCOUNTER — Other Ambulatory Visit: Payer: Self-pay | Admitting: Radiation Oncology

## 2023-04-22 ENCOUNTER — Inpatient Hospital Stay: Payer: No Typology Code available for payment source | Admitting: Oncology

## 2023-04-22 VITALS — BP 154/84 | HR 51 | Temp 98.1°F | Resp 17 | Ht 72.0 in | Wt 211.2 lb

## 2023-04-22 DIAGNOSIS — Z79899 Other long term (current) drug therapy: Secondary | ICD-10-CM | POA: Diagnosis not present

## 2023-04-22 DIAGNOSIS — J351 Hypertrophy of tonsils: Secondary | ICD-10-CM | POA: Insufficient documentation

## 2023-04-22 DIAGNOSIS — R599 Enlarged lymph nodes, unspecified: Secondary | ICD-10-CM

## 2023-04-22 DIAGNOSIS — D72819 Decreased white blood cell count, unspecified: Secondary | ICD-10-CM | POA: Diagnosis not present

## 2023-04-22 DIAGNOSIS — Z0000 Encounter for general adult medical examination without abnormal findings: Secondary | ICD-10-CM | POA: Insufficient documentation

## 2023-04-22 DIAGNOSIS — C024 Malignant neoplasm of lingual tonsil: Secondary | ICD-10-CM

## 2023-04-22 DIAGNOSIS — Z9049 Acquired absence of other specified parts of digestive tract: Secondary | ICD-10-CM | POA: Diagnosis not present

## 2023-04-22 DIAGNOSIS — Z5111 Encounter for antineoplastic chemotherapy: Secondary | ICD-10-CM | POA: Insufficient documentation

## 2023-04-22 LAB — CBC WITH DIFFERENTIAL/PLATELET
Abs Immature Granulocytes: 0.01 10*3/uL (ref 0.00–0.07)
Basophils Absolute: 0.1 10*3/uL (ref 0.0–0.1)
Basophils Relative: 2 %
Eosinophils Absolute: 0.1 10*3/uL (ref 0.0–0.5)
Eosinophils Relative: 3 %
HCT: 47.2 % (ref 39.0–52.0)
Hemoglobin: 15.9 g/dL (ref 13.0–17.0)
Immature Granulocytes: 0 %
Lymphocytes Relative: 32 %
Lymphs Abs: 1.2 10*3/uL (ref 0.7–4.0)
MCH: 30.3 pg (ref 26.0–34.0)
MCHC: 33.7 g/dL (ref 30.0–36.0)
MCV: 90.1 fL (ref 80.0–100.0)
Monocytes Absolute: 0.6 10*3/uL (ref 0.1–1.0)
Monocytes Relative: 17 %
Neutro Abs: 1.7 10*3/uL (ref 1.7–7.7)
Neutrophils Relative %: 46 %
Platelets: 301 10*3/uL (ref 150–400)
RBC: 5.24 MIL/uL (ref 4.22–5.81)
RDW: 11.6 % (ref 11.5–15.5)
WBC: 3.7 10*3/uL — ABNORMAL LOW (ref 4.0–10.5)
nRBC: 0 % (ref 0.0–0.2)

## 2023-04-22 LAB — COMPREHENSIVE METABOLIC PANEL
ALT: 19 U/L (ref 0–44)
AST: 23 U/L (ref 15–41)
Albumin: 4.5 g/dL (ref 3.5–5.0)
Alkaline Phosphatase: 60 U/L (ref 38–126)
Anion gap: 7 (ref 5–15)
BUN: 15 mg/dL (ref 8–23)
CO2: 26 mmol/L (ref 22–32)
Calcium: 9.8 mg/dL (ref 8.9–10.3)
Chloride: 107 mmol/L (ref 98–111)
Creatinine, Ser: 0.75 mg/dL (ref 0.61–1.24)
GFR, Estimated: >60 mL/min (ref >60)
Glucose, Bld: 86 mg/dL (ref 70–99)
Potassium: 4.2 mmol/L (ref 3.5–5.1)
Sodium: 140 mmol/L (ref 135–145)
Total Bilirubin: 0.6 mg/dL (ref 0.0–1.2)
Total Protein: 7.9 g/dL (ref 6.5–8.1)

## 2023-04-22 LAB — TSH: TSH: 0.822 u[IU]/mL (ref 0.350–4.500)

## 2023-04-22 NOTE — Progress Notes (Signed)
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 Clinically cT2,cN1,cM0.   ASSESSMENT & PLAN:   ABAS SGRO is a 63 y.o. 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  Lingual tonsil carcinoma (HCC) - Please review oncology history for additional details and timeline of events.  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 Unknown p16 status.  Clinically cT2,cN1,cM0.  There is concern for extracapsular extension based on imaging.  Discussed his case in tumor conference on 04/21/2023. Given the size and nature of the lymph node with concern for extracapsular extension, he has a high likelihood of requiring adjuvant chemoradiation if removed surgically.  Hence consensus opinion is to proceed with concurrent chemoradiation.  It was decided to not pursue another biopsy in an attempt to determine p16 status, since the treatment plan would not change.  -Discussed tumor board recommendations with the patient.  He is agreeable to proceeding with concurrent chemoradiation. We have discussed about role of cisplatin being a radiosensitizer in the treatment of head and neck cancer.  We have discussed about the curative intent of chemoradiation for  this patient.     We have discussed about mechanism of action of cisplatin, adverse effects of cisplatin including but not limited to fatigue, nausea, vomiting, increased risk of infections, mucositis, ototoxicity, nephrotoxicity, peripheral neuropathy.  Patient understands that some of the side effects can be permanent and even potentially fatal.  We have discussed about role of Mediport and G-tube for chemotherapy administration and nutrition respectively since most of these patients have severe mucositis during the treatment.  At this time we do not know if weekly cisplatin is inferior to every 21 days cisplatin since there is no head-to-head comparison trial.  I did mention to the patient however weekly cisplatin is well-tolerated with less adverse effects and most of the patients tend to complete treatment as planned.  Patient is willing to proceed with weekly cisplatin.  Cisplatin will be given at 40 mg/m IV dose weekly during the course of radiation.  -CBCD, CMP, TSH are grossly unremarkable today.  Mild leukopenia with white count of 3700 but ANC of 1700.  -Our nurse navigator will arrange Port-A-Cath, feeding tube appointments and I will plan to see him during the same week that he starts radiation, to begin chemotherapy.    Healthcare maintenance Discussed importance of vaccinations prior to treatment due to anticipated immunosuppression. Patient is an Art gallery manager, active, non-smoker, with a recent clear colonoscopy. - Administer COVID and RSV vaccines before treatment initiation     I reviewed lab results and outside records for this visit and discussed relevant results with the patient. Diagnosis, plan of care and treatment options were also discussed in detail with the patient. Opportunity provided to ask questions and answers provided to his apparent satisfaction. Provided instructions to call our clinic with any problems, questions or  concerns prior to return visit. I recommended to  continue follow-up with PCP and sub-specialists. He verbalized understanding and agreed with the plan. No barriers to learning was detected.  NCCN guidelines have been consulted in the planning of this patient's care.  Meryl Crutch, MD  04/22/2023 4:46 PM  [facility] - A DE[facility] OF [facility] [address] [place] [address] Dept: [phone] Dept Fax: [phone]   HISTORY OF PRESENTING ILLNESS:   Discussed the use of AI scribe software for clinical note transcription with the patient, who gave verbal consent to proceed.   I have reviewed his chart and materials related to his cancer extensively and collaborated history with the patient. Summary of oncologic history is as follows:  ONCOLOGY HISTORY:  He presented to his dentist last year with c/o of a small lump to his right neck. He was subsequently referred to Dr. Pollyann Kennedy at [facility], on 04/02/23 for further evaluation. Physical exam performed at that time noted a palpable 4 cm right level 2 neck mass. No other masses were palpated.    A biopsy of the right neck mass was collected that date (04/02/23) and showed findings consistent with metastatic squamous cell carcinoma.  There was insufficient tissue to check for p16 status.   Staging PET scan on 04/14/22 revealed hypermetabolic activity at the right lingual tonsil and right level II node. The enlarged lymph node appeared to measure approximately 2.6 cm in greatest dimension. No evidence of distant metastatic disease was seen.   He later had CT soft [facility] neck at [facility].  Unknown p16 status.  Clinically cT2,cN1,cM0.  There is concern for extracapsular extension based on imaging.  Discussed his case in tumor conference on 04/21/2023. Given the size and nature of the lymph node with concern for extracapsular extension, he has a high likelihood of requiring adjuvant chemoradiation if removed  surgically.  Hence consensus opinion is to proceed with concurrent chemoradiation. It was decided to not pursue another biopsy in an attempt to determine p16 status, since the treatment plan would not change.  Oncology History  Lingual tonsil carcinoma (HCC)  04/20/2023 Initial Diagnosis   Lingual tonsil carcinoma (HCC)   04/22/2023 Cancer Staging   Staging form: Pharynx - HPV-Mediated Oropharynx, AJCC 8th Edition - Clinical: Stage I (cT2, cN1, cM0, p16: Unknown, HPV: Unknown) - Signed by Meryl Crutch, MD on 04/22/2023     INTERVAL HISTORY:  He reports lump in his neck, first noticed by his dentist during a routine check-up in October 2024. The patient initially dismissed the lump, but noticed it again while shaving in November. A subsequent doctor's visit and CT scan confirmed the presence of a mass, leading to a referral to an ear, nose, and throat specialist. A fine needle aspiration was performed.   The patient reports no current pain, difficulty swallowing, or breathing issues. The patient has a history of an accident with multiple implants and takes finasteride for hair loss. The patient is active, working out four days a week and maintaining a busy weekend schedule. The patient has noticed a slight weight loss, which he attributes to an increased metabolism.  MEDICAL HISTORY:  History reviewed. No pertinent past medical history.  SURGICAL HISTORY: Past Surgical History:  Procedure Laterality Date   CHOLECYSTECTOMY     HERNIA REPAIR      SOCIAL HISTORY: Social History   Socioeconomic History   Marital status: Married  Spouse name: Not on file   Number of children: Not on file   Years of education: Not on file   Highest education level: Not on file  Occupational History   Not on file  Tobacco Use   Smoking status: Never   Smokeless tobacco: Not on file  Substance and Sexual Activity   Alcohol use: No   Drug use: No   Sexual activity: Not on file  Other Topics  Concern   Not on file  Social History Narrative   Not on file   Social Drivers of Health   Financial Resource Strain: Not on file  Food Insecurity: No Food Insecurity (04/20/2023)   Hunger Vital Sign    Worried About Running Out of Food in the Last Year: Never true    Ran Out of Food in the Last Year: Never true  Transportation Needs: No Transportation Needs (04/20/2023)   PRAPARE - Administrator, Civil Service (Medical): No    Lack of Transportation (Non-Medical): No  Physical Activity: Not on file  Stress: Not on file  Social Connections: Not on file  Intimate Partner Violence: Not At Risk (04/20/2023)   Humiliation, Afraid, Rape, and Kick questionnaire    Fear of Current or Ex-Partner: No    Emotionally Abused: No    Physically Abused: No    Sexually Abused: No    FAMILY HISTORY: History reviewed. No pertinent family history.  ALLERGIES:  He has no known allergies.  MEDICATIONS:  Current Outpatient Medications  Medication Sig Dispense Refill   finasteride (PROSCAR) 5 MG tablet Take 5 mg by mouth daily. Takes 1/2 Tab to equal 2.5 mg daily     tadalafil (CIALIS) 20 MG tablet Take 20 mg by mouth daily as needed. (Patient not taking: Reported on 04/22/2023)     No current facility-administered medications for this visit.    REVIEW OF SYSTEMS:    Review of Systems - Oncology  All other pertinent systems were reviewed with the patient and are negative.  PHYSICAL EXAMINATION:  ECOG PERFORMANCE STATUS: 1 - Symptomatic but completely ambulatory  Vitals:   04/22/23 0856  BP: (!) 154/84  Pulse: (!) 51  Resp: 17  Temp: 98.1 F (36.7 C)  SpO2: 100%   Filed Weights   04/22/23 0856  Weight: 211 lb 3.2 oz (95.8 kg)    Physical Exam Constitutional:      General: He is not in acute distress.    Appearance: Normal appearance.  HENT:     Head: Normocephalic and atraumatic.     Mouth/Throat:     Comments: Slightly enlarged right tonsil Eyes:      General: No scleral icterus.    Conjunctiva/sclera: Conjunctivae normal.  Cardiovascular:     Rate and Rhythm: Normal rate and regular rhythm.     Heart sounds: Normal heart sounds.  Pulmonary:     Effort: Pulmonary effort is normal.     Breath sounds: Normal breath sounds.  Abdominal:     General: There is no distension.  Musculoskeletal:     Right lower leg: No edema.     Left lower leg: No edema.  Lymphadenopathy:     Cervical: Cervical adenopathy (hard, fixed, right sided LN ~ 4 cm) present.  Neurological:     General: No focal deficit present.     Mental Status: He is alert and oriented to person, place, and time.  Psychiatric:        Mood and Affect: Mood normal.  Behavior: Behavior normal.        Thought Content: Thought content normal.     LABORATORY DATA:   I have reviewed the data as listed.  Results for orders placed or performed in visit on 04/22/23  TSH  Result Value Ref Range   TSH 0.822 0.350 - 4.500 uIU/mL  CBC with Differential/Platelet  Result Value Ref Range   WBC 3.7 (L) 4.0 - 10.5 K/uL   RBC 5.24 4.22 - 5.81 MIL/uL   Hemoglobin 15.9 13.0 - 17.0 g/dL   HCT 16.1 09.6 - 04.5 %   MCV 90.1 80.0 - 100.0 fL   MCH 30.3 26.0 - 34.0 pg   MCHC 33.7 30.0 - 36.0 g/dL   RDW 40.9 81.1 - 91.4 %   Platelets 301 150 - 400 K/uL   nRBC 0.0 0.0 - 0.2 %   Neutrophils Relative % 46 %   Neutro Abs 1.7 1.7 - 7.7 K/uL   Lymphocytes Relative 32 %   Lymphs Abs 1.2 0.7 - 4.0 K/uL   Monocytes Relative 17 %   Monocytes Absolute 0.6 0.1 - 1.0 K/uL   Eosinophils Relative 3 %   Eosinophils Absolute 0.1 0.0 - 0.5 K/uL   Basophils Relative 2 %   Basophils Absolute 0.1 0.0 - 0.1 K/uL   Immature Granulocytes 0 %   Abs Immature Granulocytes 0.01 0.00 - 0.07 K/uL  Comprehensive metabolic panel  Result Value Ref Range   Sodium 140 135 - 145 mmol/L   Potassium 4.2 3.5 - 5.1 mmol/L   Chloride 107 98 - 111 mmol/L   CO2 26 22 - 32 mmol/L   Glucose, Bld 86 70 - 99 mg/dL    BUN 15 8 - 23 mg/dL   Creatinine, Ser 7.82 0.61 - 1.24 mg/dL   Calcium 9.8 8.9 - 95.6 mg/dL   Total Protein 7.9 6.5 - 8.1 g/dL   Albumin 4.5 3.5 - 5.0 g/dL   AST 23 15 - 41 U/L   ALT 19 0 - 44 U/L   Alkaline Phosphatase 60 38 - 126 U/L   Total Bilirubin 0.6 0.0 - 1.2 mg/dL   GFR, Estimated >21 >30 mL/min   Anion gap 7 5 - 15     RADIOGRAPHIC STUDIES:  I have personally reviewed the radiological images as listed and agree with the findings in the report.  NM PET Image Initial (PI) Skull Base To Thigh (F-18 FDG) Result Date: 04/15/2023 CLINICAL DATA:  Initial treatment strategy for squamous cell carcinoma head neck. EXAM: NUCLEAR MEDICINE PET SKULL BASE TO THIGH TECHNIQUE: 10.1 mCi F-18 FDG was injected intravenously. Full-ring PET imaging was performed from the skull base to thigh after the radiotracer. CT data was obtained and used for attenuation correction and anatomic localization. Fasting blood glucose: 80 mg/dl COMPARISON:  None FINDINGS: NECK: Asymmetric hypermetabolic activity at the level of the RIGHT lingual tonsil with SUV max equal 10 8 image 28. There is asymmetric thickening associated metabolic activity measuring 10 mm x 8 mm on image 29/4). Activity is localized to mucosal surface Hypermetabolic enlarged RIGHT level 2 lymph node measures 2.6 cm on image 37. The central portion of this enlarged node is hypermetabolic with SUV max equal 11.6. the node appears inflamed. Incidental CT findings: None. CHEST: No hypermetabolic mediastinal or hilar nodes. No suspicious pulmonary nodules on the CT scan. Incidental CT findings: None. ABDOMEN/PELVIS: No abnormal hypermetabolic activity within the liver, pancreas, adrenal glands, or spleen. No hypermetabolic lymph nodes in the abdomen or pelvis.  Incidental CT findings: Postcholecystectomy. Hernia repair ventral peritoneal space above the pubic symphysis. SKELETON: Healed fracture of the LEFT inferior pubic ramus. No suspicious lesion Incidental  CT findings: None. IMPRESSION: 1. Hypermetabolic activity at the level of the RIGHT lingual tonsil consistent with primary head and neck squamous cell carcinoma. 2. Hypermetabolic enlarged RIGHT level II lymph node consistent with nodal metastasis. The node appears inflamed. 3. No evidence distant metastatic disease. Electronically Signed   By: Genevive Bi M.D.   On: 04/15/2023 16:43    Orders Placed This Encounter  Procedures   Comprehensive metabolic panel    Standing Status:   Future    Number of Occurrences:   1    Expiration Date:   04/21/2024   CBC with Differential/Platelet    Standing Status:   Future    Number of Occurrences:   1    Expiration Date:   04/21/2024   TSH    Standing Status:   Future    Number of Occurrences:   1    Expiration Date:   04/21/2024    CODE STATUS:   Future Appointments  Date Time Provider Department Center  04/22/2023  5:15 PM [facility] None     I spent a total of 60 minutes during this encounter with the patient including review of chart and various tests results, discussions about plan of care and coordination of care plan.  This document was completed utilizing speech recognition software. Grammatical errors, random word insertions, pronoun errors, and incomplete sentences are an occasional consequence of this system due to software limitations, ambient noise, and hardware issues. Any formal questions or concerns about the content, text or information contained within the body of this dictation should be directly addressed to the provider for clarification.

## 2023-04-22 NOTE — Assessment & Plan Note (Addendum)
-   Please review oncology history for additional details and timeline of events.  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 Unknown p16 status.  Clinically cT2,cN1,cM0.  There is concern for extracapsular extension based on imaging.  Discussed his case in tumor conference on 04/21/2023. Given the size and nature of the lymph node with concern for extracapsular extension, he has a high likelihood of requiring adjuvant chemoradiation if removed surgically.  Hence consensus opinion is to proceed with concurrent chemoradiation.  It was decided to not pursue another biopsy in an attempt to determine p16 status, since the treatment plan would not change.  -Discussed tumor board recommendations with the patient.  He is agreeable to proceeding with concurrent chemoradiation. We have discussed about role of cisplatin being a radiosensitizer in the treatment of head and neck cancer.  We have discussed about the curative intent of chemoradiation for this patient.     We have discussed about mechanism of action of cisplatin, adverse effects of cisplatin including but not limited to fatigue, nausea, vomiting, increased risk of infections, mucositis, ototoxicity, nephrotoxicity, peripheral neuropathy.  Patient understands that some of the side effects can be permanent and even potentially fatal.  We have discussed about role of Mediport and G-tube for chemotherapy administration and nutrition respectively since most of these patients have severe mucositis during the treatment.  At this time we do not know if weekly cisplatin is inferior to every 21 days cisplatin since there is no head-to-head comparison trial.  I did mention to the patient however weekly cisplatin is well-tolerated with less adverse effects and most of the patients tend to complete treatment as planned.  Patient is willing to proceed with weekly  cisplatin.  Cisplatin will be given at 40 mg/m IV dose weekly during the course of radiation.  -CBCD, CMP, TSH are grossly unremarkable today.  Mild leukopenia with white count of 3700 but ANC of 1700.  -Our nurse navigator will arrange Port-A-Cath, feeding tube appointments and I will plan to see him during the same week that he starts radiation, to begin chemotherapy.

## 2023-04-22 NOTE — Assessment & Plan Note (Signed)
Discussed importance of vaccinations prior to treatment due to anticipated immunosuppression. Patient is an Art gallery manager, active, non-smoker, with a recent clear colonoscopy. - Administer COVID and RSV vaccines before treatment initiation

## 2023-04-23 ENCOUNTER — Other Ambulatory Visit: Payer: Self-pay

## 2023-04-23 DIAGNOSIS — C024 Malignant neoplasm of lingual tonsil: Secondary | ICD-10-CM

## 2023-04-28 ENCOUNTER — Other Ambulatory Visit: Payer: Self-pay

## 2023-04-28 DIAGNOSIS — C024 Malignant neoplasm of lingual tonsil: Secondary | ICD-10-CM

## 2023-04-30 ENCOUNTER — Other Ambulatory Visit: Payer: Self-pay | Admitting: Oncology

## 2023-04-30 ENCOUNTER — Ambulatory Visit
Admission: RE | Admit: 2023-04-30 | Discharge: 2023-04-30 | Disposition: A | Discharge status: Home / Self Care | Payer: No Typology Code available for payment source | Source: Ambulatory Visit | Attending: Radiation Oncology | Admitting: Radiation Oncology

## 2023-04-30 ENCOUNTER — Telehealth: Payer: Self-pay | Admitting: Oncology

## 2023-04-30 ENCOUNTER — Ambulatory Visit
Admission: RE | Admit: 2023-04-30 | Discharge: 2023-04-30 | Disposition: A | Discharge status: Home / Self Care | Payer: No Typology Code available for payment source | Source: Ambulatory Visit | Attending: Radiation Oncology

## 2023-04-30 DIAGNOSIS — C024 Malignant neoplasm of lingual tonsil: Secondary | ICD-10-CM | POA: Insufficient documentation

## 2023-04-30 MED ORDER — LIDOCAINE-PRILOCAINE 2.5-2.5 % EX CREA: TOPICAL_CREAM | CUTANEOUS | 3 refills | Status: DC

## 2023-04-30 MED ORDER — PROCHLORPERAZINE MALEATE 10 MG PO TABS: 10.0000 mg | ORAL_TABLET | Freq: Four times a day (QID) | ORAL | 1 refills | Status: DC | PRN

## 2023-04-30 MED ORDER — DEXAMETHASONE 4 MG PO TABS: ORAL_TABLET | ORAL | 1 refills | Status: DC

## 2023-04-30 MED ORDER — ONDANSETRON HCL 8 MG PO TABS: 8.0000 mg | ORAL_TABLET | Freq: Three times a day (TID) | ORAL | 1 refills | Status: DC | PRN

## 2023-04-30 NOTE — Telephone Encounter (Signed)
Randy Kaufman

## 2023-04-30 NOTE — Progress Notes (Signed)
Oncology Nurse Navigator Documentation   Met with Mr. Hoos and his wife to provide PEG education prior to 05/07/23 placement.  Using  PEG teaching device   and Teach Back, provided education for PEG use and care, including: hand hygiene, gravity bolus administration of daily water flushes and nutritional supplement, fluids and medications; care of tube insertion site including daily dressing change and cleaning; S&S of infection.   Mr. Aburto correctly verbalized procedures for and provided correct return demonstration of gravity administration of water, dressing change and site care.  I provided written instructions for PEG flushing/dressing change in support of verbal instruction.   I provided/described contents of Start of Care Bolus Feeding Kit (2 60 cc syringes, 1 box 4x4 drainage sponges, 1 package mesh briefs, 1 roll paper tape, 1 case Osmolite 1.5).  He voiced understanding he is to start using Osmolite per guidance of Nutrition. He understands I will be available for ongoing PEG support. Provided barium sulfate prep which I obtained from [facility] and reviewed instructions.     To provide support, encouragement and care continuity, I met with him after his CT SIM. He was accompanied by his wife.  He tolerated procedure without difficulty, denied questions/concerns.   I encouraged him to call me prior to his 05/10/23 New Start.   Hedda Slade RN, BSN, OCN Head & Neck Oncology Nurse Navigator [facility] at [facility] Phone # [phone]  Fax # [phone]

## 2023-04-30 NOTE — Progress Notes (Signed)
Has armband been applied? Yes  Does patient have an allergy to IV contrast dye?: No.   Has patient ever received premedication for IV contrast dye?: No.   Does patient take metformin?: No.  If patient does take metformin when was the last dose: N/A  Date of lab work: 04/22/2023 BUN: 15 CR: 0.75 eGfr: >60  IV site: right forearm   Has IV site been added to flowsheet?  Yes  There were no vitals taken for this visit.

## 2023-04-30 NOTE — Telephone Encounter (Signed)
Marland Kitchen

## 2023-04-30 NOTE — Progress Notes (Signed)
START ON PATHWAY REGIMEN - Head and Neck     A cycle is every 7 days:     Cisplatin   **Always confirm dose/schedule in your pharmacy ordering system**  Patient Characteristics: Oropharynx, HPV Negative/Unknown, Preoperative or Nonsurgical Candidate (Clinical Staging), Stage III, Not Eligible for Surgery Disease Classification: Oropharynx HPV Status: Awaiting Test Results Therapeutic Status: Preoperative or Nonsurgical Candidate (Clinical Staging) AJCC T Category: cT1 AJCC 8 Stage Grouping: III AJCC N Category: cN1 AJCC M Category: cM0 Surgical Candidacy: Not Eligible for Surgery Intent of Therapy: Curative Intent, Discussed with Patient

## 2023-05-01 ENCOUNTER — Telehealth: Payer: Self-pay | Admitting: Oncology

## 2023-05-01 ENCOUNTER — Other Ambulatory Visit: Payer: Self-pay

## 2023-05-01 NOTE — Telephone Encounter (Signed)
Marland Kitchen

## 2023-05-05 ENCOUNTER — Inpatient Hospital Stay: Payer: No Typology Code available for payment source

## 2023-05-06 ENCOUNTER — Other Ambulatory Visit: Payer: Self-pay | Admitting: Student

## 2023-05-06 DIAGNOSIS — C024 Malignant neoplasm of lingual tonsil: Secondary | ICD-10-CM

## 2023-05-06 NOTE — H&P (Signed)
Chief Complaint: Lingual tonsil carcinoma   Referring Provider(s): Virginia Crews MD    Supervising Physician: Gilmer Mor  Patient Status: [facility] - Out-pt  History of Present Illness: Randy Kaufman is a 63 y.o. male with a history of cholecystectomy, hernia repair, and no lifetime tobacco use. Pt reports extensive second hand smoke from his parents as well as his employment in a tobacco factory.  In 2023 he presented to his dentist with a lump on his right neck and was referred to Dr. Pollyann Kennedy [facility] ENT. Dr. 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  A PET scan on 04/15/23 revealed hypermetabolic activity with no evidence of distant metastatic disease.  Pt has been following with Dr. Arlana Pouch oncology and will be receiving chemotherapy.  Dr. Arlana Pouch consulted interventional radiology for placement of image guided port insertion and image guided gastrostomy tube insertion.    Patient is Full Code  No past medical history on file.  Past Surgical History:  Procedure Laterality Date   CHOLECYSTECTOMY     HERNIA REPAIR      Allergies: Patient has no known allergies.  Medications: Prior to Admission medications   Medication Sig Start Date End Date Taking? Authorizing Provider  dexamethasone (DECADRON) 4 MG tablet Take 2 tablets (8 mg) by mouth daily x 3 days starting the day after cisplatin chemotherapy. Take with food. 04/30/23   Pasam, Archie Patten, MD  finasteride (PROSCAR) 5 MG tablet Take 5 mg by mouth daily. Takes 1/2 Tab to equal 2.5 mg daily    [provider]  lidocaine-prilocaine (EMLA) cream Apply to affected area once 04/30/23   Pasam, Avinash, MD  ondansetron (ZOFRAN) 8 MG tablet Take 1 tablet (8 mg total) by mouth every 8 (eight) hours as needed for nausea or vomiting. Start on the third day after cisplatin. 04/30/23   Pasam, Archie Patten, MD  prochlorperazine (COMPAZINE) 10 MG tablet Take 1 tablet (10  mg total) by mouth every 6 (six) hours as needed (Nausea or vomiting). 04/30/23   Pasam, Archie Patten, MD  tadalafil (CIALIS) 20 MG tablet Take 20 mg by mouth daily as needed. Patient not taking: Reported on 04/22/2023 01/18/23   [provider]     No family history on file.  Social History   Socioeconomic History   Marital status: Married    Spouse name: Not on file   Number of children: Not on file   Years of education: Not on file   Highest education level: Not on file  Occupational History   Not on file  Tobacco Use   Smoking status: Never   Smokeless tobacco: Not on file  Substance and Sexual Activity   Alcohol use: No   Drug use: No   Sexual activity: Not on file  Other Topics Concern   Not on file  Social History Narrative   Not on file   Social Drivers of Health   Financial Resource Strain: Not on file  Food Insecurity: No Food Insecurity (04/20/2023)   Hunger Vital Sign    Worried About Running Out of Food in the Last Year: Never true    Ran Out of Food in the Last Year: Never true  Transportation Needs: No Transportation Needs (04/20/2023)   PRAPARE - Administrator, Civil Service (Medical): No    Lack of Transportation (Non-Medical): No  Physical Activity: Not on file  Stress: Not on file  Social  Connections: Not on file     Review of Systems: A 12 point ROS discussed and pertinent positives are indicated in the HPI above.  All other systems are negative.  Review of Systems  Constitutional:  Negative for fatigue and fever.  HENT:  Negative for congestion and dental problem.   Respiratory:  Negative for cough, chest tightness, shortness of breath and wheezing.   Cardiovascular:  Negative for chest pain.  Gastrointestinal:  Negative for diarrhea, nausea and vomiting.  Neurological:  Negative for light-headedness and headaches.  Psychiatric/Behavioral:  Negative for agitation, behavioral problems and confusion.     Vital Signs: There were  no vitals taken for this visit.  Advance Care Plan: The advanced care place/surrogate decision maker was discussed at the time of visit and the patient did not wish to discuss or was not able to name a surrogate decision maker or provide an advance care plan.  Physical Exam Constitutional:      Appearance: Normal appearance.  HENT:     Head: Normocephalic and atraumatic.     Mouth/Throat:     Mouth: Mucous membranes are moist.  Cardiovascular:     Rate and Rhythm: Normal rate and regular rhythm.  Pulmonary:     Effort: Pulmonary effort is normal.     Breath sounds: Normal breath sounds.  Abdominal:     General: Bowel sounds are normal.     Palpations: Abdomen is soft.  Musculoskeletal:        General: Normal range of motion.     Cervical back: Normal range of motion.  Skin:    General: Skin is warm and dry.  Neurological:     Mental Status: He is alert and oriented to person, place, and time.  Psychiatric:        Mood and Affect: Mood normal.        Behavior: Behavior normal.     Imaging: NM PET Image Initial (PI) Skull Base To Thigh (F-18 FDG) Result Date: 04/15/2023 CLINICAL DATA:  Initial treatment strategy for squamous cell carcinoma head neck. EXAM: NUCLEAR MEDICINE PET SKULL BASE TO THIGH TECHNIQUE: 10.1 mCi F-18 FDG was injected intravenously. Full-ring PET imaging was performed from the skull base to thigh after the radiotracer. CT data was obtained and used for attenuation correction and anatomic localization. Fasting blood glucose: 80 mg/dl COMPARISON:  None FINDINGS: NECK: Asymmetric hypermetabolic activity at the level of the RIGHT lingual tonsil with SUV max equal 10 8 image 28. There is asymmetric thickening associated metabolic activity measuring 10 mm x 8 mm on image 29/4). Activity is localized to mucosal surface Hypermetabolic enlarged RIGHT level 2 lymph node measures 2.6 cm on image 37. The central portion of this enlarged node is hypermetabolic with SUV max equal  11.6. the node appears inflamed. Incidental CT findings: None. CHEST: No hypermetabolic mediastinal or hilar nodes. No suspicious pulmonary nodules on the CT scan. Incidental CT findings: None. ABDOMEN/PELVIS: No abnormal hypermetabolic activity within the liver, pancreas, adrenal glands, or spleen. No hypermetabolic lymph nodes in the abdomen or pelvis. Incidental CT findings: Postcholecystectomy. Hernia repair ventral peritoneal space above the pubic symphysis. SKELETON: Healed fracture of the LEFT inferior pubic ramus. No suspicious lesion Incidental CT findings: None. IMPRESSION: 1. Hypermetabolic activity at the level of the RIGHT lingual tonsil consistent with primary head and neck squamous cell carcinoma. 2. Hypermetabolic enlarged RIGHT level II lymph node consistent with nodal metastasis. The node appears inflamed. 3. No evidence distant metastatic disease. Electronically Signed  By: Genevive Bi M.D.   On: 04/15/2023 16:43    Labs:  CBC: Recent Labs    04/22/23 1008  WBC 3.7*  HGB 15.9  HCT 47.2  PLT 301    COAGS: No results for input(s): "INR", "APTT" in the last 8760 hours.  BMP: Recent Labs    04/22/23 1008  NA 140  K 4.2  CL 107  CO2 26  GLUCOSE 86  BUN 15  CALCIUM 9.8  CREATININE 0.75  GFRNONAA >60    LIVER FUNCTION TESTS: Recent Labs    04/22/23 1008  BILITOT 0.6  AST 23  ALT 19  ALKPHOS 60  PROT 7.9  ALBUMIN 4.5    TUMOR MARKERS: No results for input(s): "AFPTM", "CEA", "CA199", "CHROMGRNA" in the last 8760 hours.  Assessment and Plan:  Patient has lingual tonsil carcinoma and will be having image guided port insertion and image guided gastrostomy tube insertion.  Both procedures are scheduled for 05/07/23.    Risks and benefits of image guided port-a-catheter placement was discussed with the patient including, but not limited to bleeding, infection, pneumothorax, or fibrin sheath development and need for additional procedures.  All of the  patient's questions were answered, patient is agreeable to proceed. Consent signed and in chart.  Risks and benefits image guided gastrostomy tube placement was discussed with the patient including, but not limited to the need for a barium enema during the procedure, bleeding, infection, peritonitis and/or damage to adjacent structures.  All of the patient's questions were answered, patient is agreeable to proceed.  Consent signed and in chart.  Thank you for allowing our service to participate in Randy Kaufman 's care.  Electronically Signed: Loman Brooklyn, PA-C   05/06/2023, 2:31 PM    I spent a total of  30 Minutes  in face to face in clinical consultation, greater than 50% of which was counseling/coordinating care for image guided port insertion and image guided gastrostomy tube insertion.

## 2023-05-07 ENCOUNTER — Encounter (HOSPITAL_COMMUNITY): Payer: Self-pay

## 2023-05-07 ENCOUNTER — Ambulatory Visit (HOSPITAL_COMMUNITY)
Admission: RE | Admit: 2023-05-07 | Discharge: 2023-05-07 | Disposition: A | Discharge status: Home / Self Care | Payer: No Typology Code available for payment source | Source: Ambulatory Visit | Attending: Oncology

## 2023-05-07 ENCOUNTER — Other Ambulatory Visit: Payer: Self-pay

## 2023-05-07 DIAGNOSIS — C024 Malignant neoplasm of lingual tonsil: Secondary | ICD-10-CM | POA: Diagnosis not present

## 2023-05-07 DIAGNOSIS — Z9049 Acquired absence of other specified parts of digestive tract: Secondary | ICD-10-CM | POA: Diagnosis not present

## 2023-05-07 HISTORY — PX: IR NASO G TUBE PLC W/FL W/RAD: IMG2321

## 2023-05-07 HISTORY — PX: IR IMAGING GUIDED PORT INSERTION: IMG5740

## 2023-05-07 HISTORY — PX: IR GASTROSTOMY TUBE MOD SED: IMG625

## 2023-05-07 LAB — CBC WITH DIFFERENTIAL/PLATELET
Abs Immature Granulocytes: 0.01 10*3/uL (ref 0.00–0.07)
Basophils Absolute: 0.1 10*3/uL (ref 0.0–0.1)
Basophils Relative: 1 %
Eosinophils Absolute: 0.1 10*3/uL (ref 0.0–0.5)
Eosinophils Relative: 4 %
HCT: 44.8 % (ref 39.0–52.0)
Hemoglobin: 15.1 g/dL (ref 13.0–17.0)
Immature Granulocytes: 0 %
Lymphocytes Relative: 30 %
Lymphs Abs: 1.2 10*3/uL (ref 0.7–4.0)
MCH: 30.5 pg (ref 26.0–34.0)
MCHC: 33.7 g/dL (ref 30.0–36.0)
MCV: 90.5 fL (ref 80.0–100.0)
Monocytes Absolute: 0.7 10*3/uL (ref 0.1–1.0)
Monocytes Relative: 18 %
Neutro Abs: 1.9 10*3/uL (ref 1.7–7.7)
Neutrophils Relative %: 47 %
Platelets: 205 10*3/uL (ref 150–400)
RBC: 4.95 MIL/uL (ref 4.22–5.81)
RDW: 11.7 % (ref 11.5–15.5)
WBC: 4 10*3/uL (ref 4.0–10.5)
nRBC: 0 % (ref 0.0–0.2)

## 2023-05-07 LAB — PROTIME-INR
INR: 1.1 (ref 0.8–1.2)
Prothrombin Time: 14.7 s (ref 11.4–15.2)

## 2023-05-07 MED ORDER — CEFAZOLIN SODIUM-DEXTROSE 2-4 GM/100ML-% IV SOLN
INTRAVENOUS | Status: AC
  Filled 2023-05-07: qty 100

## 2023-05-07 MED ORDER — HYDROMORPHONE HCL 1 MG/ML IJ SOLN: 1.0000 mg | INTRAMUSCULAR | Status: DC | PRN

## 2023-05-07 MED ORDER — IOHEXOL 300 MG/ML  SOLN
50.0000 mL | Freq: Once | INTRAMUSCULAR | Status: AC | PRN
  Administered 2023-05-07: 20 mL

## 2023-05-07 MED ORDER — CEFAZOLIN SODIUM-DEXTROSE 2-4 GM/100ML-% IV SOLN
INTRAVENOUS | Status: AC | PRN
  Administered 2023-05-07: 2 g via INTRAVENOUS

## 2023-05-07 MED ORDER — DIPHENHYDRAMINE HCL 50 MG/ML IJ SOLN
INTRAMUSCULAR | Status: AC | PRN
  Administered 2023-05-07 (×2): 25 mg via INTRAVENOUS

## 2023-05-07 MED ORDER — FENTANYL CITRATE (PF) 100 MCG/2ML IJ SOLN
INTRAMUSCULAR | Status: AC
  Filled 2023-05-07: qty 2

## 2023-05-07 MED ORDER — HEPARIN SOD (PORK) LOCK FLUSH 100 UNIT/ML IV SOLN
INTRAVENOUS | Status: AC
  Filled 2023-05-07: qty 5

## 2023-05-07 MED ORDER — HYDROCODONE-ACETAMINOPHEN 5-325 MG PO TABS: 1.0000 | ORAL_TABLET | ORAL | Status: DC | PRN

## 2023-05-07 MED ORDER — MIDAZOLAM HCL 2 MG/2ML IJ SOLN
INTRAMUSCULAR | Status: AC | PRN
  Administered 2023-05-07 (×2): 1 mg via INTRAVENOUS

## 2023-05-07 MED ORDER — FENTANYL CITRATE (PF) 100 MCG/2ML IJ SOLN
INTRAMUSCULAR | Status: AC | PRN
  Administered 2023-05-07: 50 ug via INTRAVENOUS
  Administered 2023-05-07 (×2): 25 ug via INTRAVENOUS

## 2023-05-07 MED ORDER — LIDOCAINE-EPINEPHRINE 1 %-1:100000 IJ SOLN
INTRAMUSCULAR | Status: AC
  Filled 2023-05-07: qty 2

## 2023-05-07 MED ORDER — CEFAZOLIN SODIUM-DEXTROSE 2-4 GM/100ML-% IV SOLN: 2.0000 g | INTRAVENOUS | Status: DC

## 2023-05-07 MED ORDER — MIDAZOLAM HCL 2 MG/2ML IJ SOLN
INTRAMUSCULAR | Status: AC
  Filled 2023-05-07: qty 2

## 2023-05-07 MED ORDER — DIPHENHYDRAMINE HCL 50 MG/ML IJ SOLN
INTRAMUSCULAR | Status: AC
  Filled 2023-05-07: qty 1

## 2023-05-07 MED ORDER — GLUCAGON HCL RDNA (DIAGNOSTIC) 1 MG IJ SOLR
INTRAMUSCULAR | Status: AC
  Filled 2023-05-07: qty 1

## 2023-05-07 MED ORDER — GLUCAGON HCL (RDNA) 1 MG IJ SOLR
INTRAMUSCULAR | Status: AC | PRN
  Administered 2023-05-07: .5 mg via INTRAVENOUS

## 2023-05-07 NOTE — Sedation Documentation (Addendum)
PAC complete. 2g Ancef, 25mg  benadryl, 1mg  of Versed and of fent given thus far. Now moving on to Gtube placement

## 2023-05-07 NOTE — Procedures (Signed)
Vascular and Interventional Radiology Procedure Note  Patient: Randy Kaufman DOB: Feb 28, 1961 Medical Record Number: [id number] Note Date/Time: 05/07/23 3:41 PM   Performing Physician: Roanna Banning, MD Assistant(s): None  Diagnosis: Head and Neck cancer  Procedure:  PORT PLACEMENT PERCUTANEOUS GASTROSTOMY TUBE PLACEMENT  Anesthesia: Conscious Sedation Complications: None Estimated Blood Loss: Minimal  Findings:  Successful right-sided port placement, with the tip of the catheter in the proximal right atrium.  Successful placement of a 36F gastrostomy tube under fluoroscopy.  Plan: Port catheter ready for use.  G-tube to gravity drainage bag x 24 hrs Liquid diet x 24 hrs OK to cap G-tube (during 24 hr period listed above) for 2 hours post liquid meal. OK for meds per tube immediately post procedure. OK to begin TFs via G-tube use in 4 hrs. Taper from trickle to goal as tolerated.   Pt is to return to [facility] for routine G-tube exchange in 6 months.  See detailed procedure note with images in PACS. The patient tolerated the procedure well without incident or complication and was returned to Recovery in stable condition.    Roanna Banning, MD [facility]   Pager. [phone] Clinic. [phone]

## 2023-05-07 NOTE — Sedation Documentation (Signed)
Gtube complete. 1mg  versed, fent, 25mg  benadryl, .5mg  glucagon given during this procedure.

## 2023-05-09 ENCOUNTER — Other Ambulatory Visit (HOSPITAL_COMMUNITY): Payer: Self-pay | Admitting: Radiology

## 2023-05-09 DIAGNOSIS — C024 Malignant neoplasm of lingual tonsil: Secondary | ICD-10-CM

## 2023-05-10 ENCOUNTER — Other Ambulatory Visit: Payer: Self-pay

## 2023-05-10 ENCOUNTER — Ambulatory Visit
Admission: RE | Admit: 2023-05-10 | Discharge: 2023-05-10 | Disposition: A | Discharge status: Home / Self Care | Payer: No Typology Code available for payment source | Source: Ambulatory Visit | Attending: Radiation Oncology

## 2023-05-10 ENCOUNTER — Ambulatory Visit
Admission: RE | Admit: 2023-05-10 | Discharge: 2023-05-10 | Disposition: A | Discharge status: Home / Self Care | Payer: No Typology Code available for payment source | Source: Ambulatory Visit | Attending: Radiation Oncology | Admitting: Radiation Oncology

## 2023-05-10 DIAGNOSIS — J351 Hypertrophy of tonsils: Secondary | ICD-10-CM | POA: Diagnosis not present

## 2023-05-10 DIAGNOSIS — Z5111 Encounter for antineoplastic chemotherapy: Secondary | ICD-10-CM | POA: Diagnosis present

## 2023-05-10 DIAGNOSIS — C024 Malignant neoplasm of lingual tonsil: Secondary | ICD-10-CM | POA: Diagnosis present

## 2023-05-10 DIAGNOSIS — R110 Nausea: Secondary | ICD-10-CM | POA: Diagnosis not present

## 2023-05-10 DIAGNOSIS — Z7952 Long term (current) use of systemic steroids: Secondary | ICD-10-CM | POA: Diagnosis not present

## 2023-05-10 DIAGNOSIS — R262 Difficulty in walking, not elsewhere classified: Secondary | ICD-10-CM | POA: Diagnosis not present

## 2023-05-10 DIAGNOSIS — Z79899 Other long term (current) drug therapy: Secondary | ICD-10-CM | POA: Diagnosis not present

## 2023-05-10 DIAGNOSIS — R053 Chronic cough: Secondary | ICD-10-CM | POA: Diagnosis not present

## 2023-05-10 DIAGNOSIS — R599 Enlarged lymph nodes, unspecified: Secondary | ICD-10-CM | POA: Diagnosis not present

## 2023-05-10 DIAGNOSIS — G479 Sleep disorder, unspecified: Secondary | ICD-10-CM | POA: Diagnosis not present

## 2023-05-10 DIAGNOSIS — Z79630 Long term (current) use of alkylating agent: Secondary | ICD-10-CM | POA: Diagnosis not present

## 2023-05-10 DIAGNOSIS — L03115 Cellulitis of right lower limb: Secondary | ICD-10-CM | POA: Diagnosis not present

## 2023-05-10 DIAGNOSIS — Z923 Personal history of irradiation: Secondary | ICD-10-CM | POA: Diagnosis not present

## 2023-05-10 DIAGNOSIS — M79671 Pain in right foot: Secondary | ICD-10-CM | POA: Insufficient documentation

## 2023-05-10 DIAGNOSIS — R232 Flushing: Secondary | ICD-10-CM | POA: Diagnosis not present

## 2023-05-10 LAB — RAD ONC ARIA SESSION SUMMARY
Course Elapsed Days: 0
Plan Fractions Treated to Date: 1
Plan Prescribed Dose Per Fraction: 2 Gy
Plan Total Fractions Prescribed: 35
Plan Total Prescribed Dose: 70 Gy
Reference Point Dosage Given to Date: 2 Gy
Reference Point Session Dosage Given: 2 Gy
Session Number: 1

## 2023-05-10 NOTE — Progress Notes (Signed)
Oncology Nurse Navigator Documentation   To provide support, encouragement and care continuity, met with Randy Kaufman after his initial RT.  He was accompanied by his wife. Randy Kaufman completed treatment without difficulty, denied questions/concerns. I encouraged them to call me with questions/concerns as treatments proceed.   Hedda Slade RN, BSN, OCN Head & Neck Oncology Nurse Navigator [facility] Phone # [phone]  Fax # [phone]

## 2023-05-11 ENCOUNTER — Ambulatory Visit
Admission: RE | Admit: 2023-05-11 | Discharge: 2023-05-11 | Disposition: A | Discharge status: Home / Self Care | Payer: No Typology Code available for payment source | Source: Ambulatory Visit | Attending: Radiation Oncology | Admitting: Radiation Oncology

## 2023-05-11 ENCOUNTER — Ambulatory Visit: Payer: No Typology Code available for payment source

## 2023-05-11 ENCOUNTER — Other Ambulatory Visit: Payer: Self-pay

## 2023-05-11 DIAGNOSIS — Z5111 Encounter for antineoplastic chemotherapy: Secondary | ICD-10-CM | POA: Diagnosis not present

## 2023-05-11 LAB — RAD ONC ARIA SESSION SUMMARY
Course Elapsed Days: 1
Plan Fractions Treated to Date: 2
Plan Prescribed Dose Per Fraction: 2 Gy
Plan Total Fractions Prescribed: 35
Plan Total Prescribed Dose: 70 Gy
Reference Point Dosage Given to Date: 4 Gy
Reference Point Session Dosage Given: 2 Gy
Session Number: 2

## 2023-05-12 ENCOUNTER — Other Ambulatory Visit: Payer: Self-pay

## 2023-05-12 ENCOUNTER — Ambulatory Visit
Admission: RE | Admit: 2023-05-12 | Discharge: 2023-05-12 | Disposition: A | Discharge status: Home / Self Care | Payer: No Typology Code available for payment source | Source: Ambulatory Visit | Attending: Radiation Oncology | Admitting: Radiation Oncology

## 2023-05-12 DIAGNOSIS — Z5111 Encounter for antineoplastic chemotherapy: Secondary | ICD-10-CM | POA: Diagnosis not present

## 2023-05-12 LAB — RAD ONC ARIA SESSION SUMMARY
Course Elapsed Days: 2
Plan Fractions Treated to Date: 3
Plan Prescribed Dose Per Fraction: 2 Gy
Plan Total Fractions Prescribed: 35
Plan Total Prescribed Dose: 70 Gy
Reference Point Dosage Given to Date: 6 Gy
Reference Point Session Dosage Given: 2 Gy
Session Number: 3

## 2023-05-13 ENCOUNTER — Other Ambulatory Visit: Payer: No Typology Code available for payment source

## 2023-05-13 ENCOUNTER — Other Ambulatory Visit: Payer: Self-pay

## 2023-05-13 ENCOUNTER — Inpatient Hospital Stay: Payer: No Typology Code available for payment source

## 2023-05-13 ENCOUNTER — Encounter: Payer: Self-pay | Admitting: Oncology

## 2023-05-13 ENCOUNTER — Ambulatory Visit
Admission: RE | Admit: 2023-05-13 | Discharge: 2023-05-13 | Disposition: A | Discharge status: Home / Self Care | Payer: No Typology Code available for payment source | Source: Ambulatory Visit | Attending: Radiation Oncology | Admitting: Radiation Oncology

## 2023-05-13 ENCOUNTER — Inpatient Hospital Stay: Payer: No Typology Code available for payment source | Attending: Oncology | Admitting: Oncology

## 2023-05-13 VITALS — BP 125/79 | HR 58 | Temp 98.7°F | Resp 18 | Ht 72.0 in | Wt 206.5 lb

## 2023-05-13 DIAGNOSIS — Z5111 Encounter for antineoplastic chemotherapy: Secondary | ICD-10-CM | POA: Insufficient documentation

## 2023-05-13 DIAGNOSIS — C024 Malignant neoplasm of lingual tonsil: Secondary | ICD-10-CM | POA: Diagnosis not present

## 2023-05-13 DIAGNOSIS — G479 Sleep disorder, unspecified: Secondary | ICD-10-CM | POA: Insufficient documentation

## 2023-05-13 DIAGNOSIS — R110 Nausea: Secondary | ICD-10-CM | POA: Insufficient documentation

## 2023-05-13 DIAGNOSIS — R599 Enlarged lymph nodes, unspecified: Secondary | ICD-10-CM | POA: Insufficient documentation

## 2023-05-13 DIAGNOSIS — Z7952 Long term (current) use of systemic steroids: Secondary | ICD-10-CM | POA: Insufficient documentation

## 2023-05-13 DIAGNOSIS — Z95828 Presence of other vascular implants and grafts: Secondary | ICD-10-CM | POA: Insufficient documentation

## 2023-05-13 DIAGNOSIS — L03115 Cellulitis of right lower limb: Secondary | ICD-10-CM | POA: Insufficient documentation

## 2023-05-13 DIAGNOSIS — Z923 Personal history of irradiation: Secondary | ICD-10-CM | POA: Insufficient documentation

## 2023-05-13 DIAGNOSIS — Z79899 Other long term (current) drug therapy: Secondary | ICD-10-CM | POA: Insufficient documentation

## 2023-05-13 DIAGNOSIS — M79671 Pain in right foot: Secondary | ICD-10-CM | POA: Insufficient documentation

## 2023-05-13 DIAGNOSIS — Z79630 Long term (current) use of alkylating agent: Secondary | ICD-10-CM | POA: Insufficient documentation

## 2023-05-13 DIAGNOSIS — R053 Chronic cough: Secondary | ICD-10-CM | POA: Insufficient documentation

## 2023-05-13 DIAGNOSIS — R262 Difficulty in walking, not elsewhere classified: Secondary | ICD-10-CM | POA: Insufficient documentation

## 2023-05-13 DIAGNOSIS — Z969 Presence of functional implant, unspecified: Secondary | ICD-10-CM | POA: Insufficient documentation

## 2023-05-13 DIAGNOSIS — R232 Flushing: Secondary | ICD-10-CM | POA: Insufficient documentation

## 2023-05-13 DIAGNOSIS — J351 Hypertrophy of tonsils: Secondary | ICD-10-CM | POA: Insufficient documentation

## 2023-05-13 LAB — CBC WITH DIFFERENTIAL (CANCER CENTER ONLY)
Abs Immature Granulocytes: 0.01 10*3/uL (ref 0.00–0.07)
Basophils Absolute: 0 10*3/uL (ref 0.0–0.1)
Basophils Relative: 1 %
Eosinophils Absolute: 0.1 10*3/uL (ref 0.0–0.5)
Eosinophils Relative: 2 %
HCT: 42.9 % (ref 39.0–52.0)
Hemoglobin: 14.6 g/dL (ref 13.0–17.0)
Immature Granulocytes: 0 %
Lymphocytes Relative: 19 %
Lymphs Abs: 0.8 10*3/uL (ref 0.7–4.0)
MCH: 30.6 pg (ref 26.0–34.0)
MCHC: 34 g/dL (ref 30.0–36.0)
MCV: 89.9 fL (ref 80.0–100.0)
Monocytes Absolute: 0.5 10*3/uL (ref 0.1–1.0)
Monocytes Relative: 12 %
Neutro Abs: 2.8 10*3/uL (ref 1.7–7.7)
Neutrophils Relative %: 66 %
Platelet Count: 187 10*3/uL (ref 150–400)
RBC: 4.77 MIL/uL (ref 4.22–5.81)
RDW: 11.3 % — ABNORMAL LOW (ref 11.5–15.5)
WBC Count: 4.3 10*3/uL (ref 4.0–10.5)
nRBC: 0 % (ref 0.0–0.2)

## 2023-05-13 LAB — BASIC METABOLIC PANEL - CANCER CENTER ONLY
Anion gap: 6 (ref 5–15)
BUN: 13 mg/dL (ref 8–23)
CO2: 27 mmol/L (ref 22–32)
Calcium: 9.6 mg/dL (ref 8.9–10.3)
Chloride: 104 mmol/L (ref 98–111)
Creatinine: 0.76 mg/dL (ref 0.61–1.24)
GFR, Estimated: >60 mL/min (ref >60)
Glucose, Bld: 94 mg/dL (ref 70–99)
Potassium: 4.3 mmol/L (ref 3.5–5.1)
Sodium: 137 mmol/L (ref 135–145)

## 2023-05-13 LAB — MAGNESIUM: Magnesium: 2 mg/dL (ref 1.7–2.4)

## 2023-05-13 LAB — RAD ONC ARIA SESSION SUMMARY
Course Elapsed Days: 3
Plan Fractions Treated to Date: 4
Plan Prescribed Dose Per Fraction: 2 Gy
Plan Total Fractions Prescribed: 35
Plan Total Prescribed Dose: 70 Gy
Reference Point Dosage Given to Date: 8 Gy
Reference Point Session Dosage Given: 2 Gy
Session Number: 4

## 2023-05-13 MED ORDER — HEPARIN SOD (PORK) LOCK FLUSH 100 UNIT/ML IV SOLN
500.0000 [IU] | Freq: Once | INTRAVENOUS | Status: AC
  Administered 2023-05-13: 500 [IU]

## 2023-05-13 MED ORDER — SODIUM CHLORIDE 0.9% FLUSH
10.0000 mL | Freq: Once | INTRAVENOUS | Status: AC
  Administered 2023-05-13: 10 mL

## 2023-05-13 NOTE — Progress Notes (Signed)
 [facility]  ONCOLOGY CLINIC PROGRESS NOTE   Patient Care Team: [name], MD as PCP - General (Family Medicine) [name], MD as Attending Physician (Radiation Oncology) [name], MD as Consulting Physician (Otolaryngology) Malmfelt, Delon CROME, RN as Oncology Nurse Navigator [name], MD as Consulting Physician (Oncology)  PATIENT NAME: Randy Kaufman   MR#: [id number] DOB: December 17, 1960  Date of visit: 05/13/2023   ASSESSMENT & PLAN:   KHIYAN CRACE is a 63 y.o.  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 Unknown p16 status.   Lingual tonsil carcinoma (HCC) - Please review oncology history for additional details and timeline of events.  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 Unknown p16 status.  Clinically cT2,cN1,cM0.  There is concern for extracapsular extension based on imaging.  Discussed his case in tumor conference on 04/21/2023. Given the size and nature of the lymph node with concern for extracapsular extension, he has a high likelihood of requiring adjuvant chemoradiation if removed surgically.  Hence consensus opinion is to proceed with concurrent chemoradiation.  It was decided to not pursue another biopsy in an attempt to determine p16 status, since the treatment plan would not change.  -Previously I discussed tumor board recommendations with the patient.  He was agreeable to proceeding with concurrent chemoradiation. We have discussed about role of cisplatin  being a radiosensitizer in the treatment of head and neck cancer.  We have discussed about the curative intent of chemoradiation for this patient.     We have discussed about mechanism of action of  cisplatin , adverse effects of cisplatin  including but not limited to fatigue, nausea, vomiting, increased risk of infections, mucositis, ototoxicity, nephrotoxicity, peripheral neuropathy.  Patient understands that some of the side effects can be permanent and even potentially fatal.  We have discussed about role of Mediport and G-tube for chemotherapy administration and nutrition respectively since most of these patients have severe mucositis during the treatment.  Patient is willing to proceed with weekly cisplatin .  Cisplatin  will be given at 40 mg/m IV dose weekly during the course of radiation.  He started radiation earlier this week from 05/10/2023.  Tolerating it well so far.  -Labs today revealed no dose-limiting toxicities.  White count is now normal at 4300 with normal differential.  Will proceed with cycle 1 of cisplatin  tomorrow, 05/14/2023 as scheduled.  Patient has nausea medications available at home.  He has established with dietitian for supportive care.   RTC in 1 week for labs, follow-up and continuation of chemotherapy.  I reviewed lab results and outside records for this visit and discussed relevant results with the patient. Diagnosis, plan of care and treatment options were also discussed in detail with the patient. Opportunity provided to ask questions and answers provided to his apparent satisfaction. Provided instructions to call our clinic with any problems, questions or concerns prior to return visit. I recommended to continue follow-up with PCP and sub-specialists. He verbalized understanding and agreed with the plan.   NCCN guidelines have been consulted in the planning of this patient's care.  I spent a total of 30 minutes during this encounter with the patient including review of chart and various tests results, discussions about plan of care and coordination of care plan.   Millman Autumn, MD  05/13/2023 1:48 PM  [facility] - A  DEPT OF [facility] [address] [place] [address] Dept: [phone] Dept Fax: [phone]    CHIEF COMPLAINT/ REASON FOR VISIT:   Squamous cell carcinoma consistent with a right lingual tonsillar primary with a metastatic ipsilateral cervical node. Clinically cT2,cN1,cM0.   Current Treatment: Concurrent chemoradiation with weekly cisplatin  started during the week of 05/10/2023.  INTERVAL HISTORY:    Discussed the use of AI scribe software for clinical note transcription with the patient, who gave verbal consent to proceed.   Randy Kaufman is here today for repeat clinical assessment.   He reports experiencing dry mouth, which he believes is a side effect of the radiation therapy. He has been using a fluoride toothpaste and cream to manage this symptom. He also reports that he has been following all the recommended guidelines for his treatment.  The patient has also had a feeding tube and a port inserted. He reports that the procedures went well and he has been managing the care of these devices at home. He has not experienced any complications or discomfort from these devices.  The patient has noticed some weight loss, which he attributes to increased metabolism due to stress. He reports that he has been eating well and maintaining his usual level of activity.  The patient has also been prescribed dexamethasone , a steroid, to prevent nausea and vomiting associated with chemotherapy.  I have reviewed the past medical history, past surgical history, social history and family history with the patient and they are unchanged from previous note.  HISTORY OF PRESENT ILLNESS:   ONCOLOGY HISTORY:   He presented to his dentist last year with c/o of a small lump to his right neck. He was subsequently referred to Dr. Jesus at [facility], on 04/02/23 for further evaluation. Physical exam performed at that time noted a palpable 4 cm right level 2 neck  mass. No other masses were palpated.    A biopsy of the right neck mass was collected that date (04/02/23) and showed findings consistent with metastatic squamous cell carcinoma.  There was insufficient tissue to check for p16 status.   Staging PET scan on 04/14/22 revealed hypermetabolic activity at the right lingual tonsil and right level II node. The enlarged lymph node appeared to measure approximately 2.6 cm in greatest dimension. No evidence of distant metastatic disease was seen.    He later had CT soft tissue neck at [facility].   Unknown p16 status.  Clinically cT2,cN1,cM0.  There is concern for extracapsular extension based on imaging.   Discussed his case in tumor conference on 04/21/2023. Given the size and nature of the lymph node with concern for extracapsular extension, he has a high likelihood of requiring adjuvant chemoradiation if removed surgically.  Hence consensus opinion was to proceed with concurrent chemoradiation. It was decided to not pursue another biopsy in an attempt to determine p16 status, since the treatment plan would not change.  He started radiation treatments from 05/10/2023.  Started chemotherapy with cisplatin  from 05/14/2023.  Oncology History  Lingual tonsil carcinoma (HCC)  04/20/2023 Initial Diagnosis   Lingual tonsil carcinoma (HCC)   04/22/2023 Cancer Staging   Staging form: Pharynx - HPV-Mediated Oropharynx, AJCC 8th Edition - Clinical: Stage I (cT2, cN1, cM0, p16: Unknown, HPV: Unknown) - Signed by [name], MD on 04/22/2023   05/14/2023 -  Chemotherapy   Patient is  on Treatment Plan : HEAD/NECK Cisplatin  (40) q7d         REVIEW OF SYSTEMS:   Review of Systems - Oncology  All other pertinent systems were reviewed with the patient and are negative.  ALLERGIES: He has no known allergies.  MEDICATIONS:  Current Outpatient Medications  Medication Sig Dispense Refill   dexamethasone  (DECADRON ) 4 MG tablet Take 2 tablets (8 mg)  by mouth daily x 3 days starting the day after cisplatin  chemotherapy. Take with food. 30 tablet 1   finasteride (PROSCAR) 5 MG tablet Take 5 mg by mouth daily. Takes 1/2 Tab to equal 2.5 mg daily     lidocaine -prilocaine  (EMLA ) cream Apply to affected area once 30 g 3   tadalafil (CIALIS) 20 MG tablet Take 20 mg by mouth daily as needed.     ondansetron  (ZOFRAN ) 8 MG tablet Take 1 tablet (8 mg total) by mouth every 8 (eight) hours as needed for nausea or vomiting. Start on the third day after cisplatin . (Patient not taking: Reported on 05/13/2023) 30 tablet 1   prochlorperazine  (COMPAZINE ) 10 MG tablet Take 1 tablet (10 mg total) by mouth every 6 (six) hours as needed (Nausea or vomiting). (Patient not taking: Reported on 05/13/2023) 30 tablet 1   No current facility-administered medications for this visit.     VITALS:   Blood pressure 125/79, pulse (!) 58, temperature 98.7 F (37.1 C), temperature source Temporal, resp. rate 18, height 6' (1.829 m), weight 206 lb 8 oz (93.7 kg), SpO2 100%.  Wt Readings from Last 3 Encounters:  05/13/23 206 lb 8 oz (93.7 kg)  05/07/23 203 lb (92.1 kg)  04/30/23 210 lb 3.2 oz (95.3 kg)    Body mass index is 28.01 kg/m.    Onc Performance Status - 05/13/23 0909       ECOG Perf Status   ECOG Perf Status Fully active, able to carry on all pre-disease performance without restriction      KPS SCALE   KPS % SCORE Normal, no compliants, no evidence of disease             PHYSICAL EXAM:   Physical Exam Constitutional:      General: He is not in acute distress.    Appearance: Normal appearance.  HENT:     Head: Normocephalic and atraumatic.     Mouth/Throat:     Comments: Slightly enlarged right tonsil Eyes:     General: No scleral icterus.    Conjunctiva/sclera: Conjunctivae normal.  Cardiovascular:     Rate and Rhythm: Normal rate and regular rhythm.     Heart sounds: Normal heart sounds.  Pulmonary:     Effort: Pulmonary effort is  normal.     Breath sounds: Normal breath sounds.  Chest:     Comments: Right-sided Port-A-Cath in place, without any evidence of infection Abdominal:     General: There is no distension.     Comments: Feeding tube in place.  No signs of infection.  Musculoskeletal:     Right lower leg: No edema.     Left lower leg: No edema.  Lymphadenopathy:     Cervical: Cervical adenopathy (hard, fixed, right sided LN ~ 4 cm) present.  Neurological:     General: No focal deficit present.     Mental Status: He is alert and oriented to person, place, and time.  Psychiatric:        Mood and Affect: Mood normal.        Behavior: Behavior  normal.        Thought Content: Thought content normal.       LABORATORY DATA:   I have reviewed the data as listed.  Results for orders placed or performed in visit on 05/13/23  Rad Onc Aria Session Summary  Result Value Ref Range   Course ID [id number]    Course Start Date 04/30/2023    Session Number 4    Course First Treatment Date 05/10/2023  1:00 PM    Course Last Treatment Date 05/13/2023 10:26 AM    Course Elapsed Days 3    Reference Point ID HN dp    Reference Point Dosage Given to Date 8 Gy   Reference Point Session Dosage Given 2 Gy   Plan ID [email or web address]    Plan Fractions Treated to Date 4    Plan Total Fractions Prescribed 35    Plan Prescribed Dose Per Fraction 2 Gy   Plan Total Prescribed Dose 70.000000 Gy   Plan Primary Reference Point HN dp   Results for orders placed or performed in visit on 05/13/23  CBC with Differential (Cancer Center Only)  Result Value Ref Range   WBC Count 4.3 4.0 - 10.5 K/uL   RBC 4.77 4.22 - 5.81 MIL/uL   Hemoglobin 14.6 13.0 - 17.0 g/dL   HCT 57.0 60.9 - 47.9 %   MCV 89.9 80.0 - 100.0 fL   MCH 30.6 26.0 - 34.0 pg   MCHC 34.0 30.0 - 36.0 g/dL   RDW 88.6 (L) 88.4 - 84.4 %   Platelet Count 187 150 - 400 K/uL   nRBC 0.0 0.0 - 0.2 %   Neutrophils Relative % 66 %   Neutro Abs 2.8 1.7 - 7.7 K/uL   Lymphocytes  Relative 19 %   Lymphs Abs 0.8 0.7 - 4.0 K/uL   Monocytes Relative 12 %   Monocytes Absolute 0.5 0.1 - 1.0 K/uL   Eosinophils Relative 2 %   Eosinophils Absolute 0.1 0.0 - 0.5 K/uL   Basophils Relative 1 %   Basophils Absolute 0.0 0.0 - 0.1 K/uL   Immature Granulocytes 0 %   Abs Immature Granulocytes 0.01 0.00 - 0.07 K/uL  Basic Metabolic Panel - Cancer Center Only  Result Value Ref Range   Sodium 137 135 - 145 mmol/L   Potassium 4.3 3.5 - 5.1 mmol/L   Chloride 104 98 - 111 mmol/L   CO2 27 22 - 32 mmol/L   Glucose, Bld 94 70 - 99 mg/dL   BUN 13 8 - 23 mg/dL   Creatinine 9.23 9.38 - 1.24 mg/dL   Calcium 9.6 8.9 - 89.6 mg/dL   GFR, Estimated >39 >39 mL/min   Anion gap 6 5 - 15  Magnesium   Result Value Ref Range   Magnesium  2.0 1.7 - 2.4 mg/dL     RADIOGRAPHIC STUDIES:  I have personally reviewed the radiological images as listed and agree with the findings in the report.  IR GASTROSTOMY TUBE MOD SED Result Date: 05/07/2023 INDICATION: Chemo/radiation for head and neck cancer EXAM: Procedures: 1. FLUOROSCOPIC NASOGASTRIC TUBE PLACEMENT 2. PERCUTANEOUS GASTROSTOMY FEEDING TUBE PLACEMENT COMPARISON:  PET-CT, 04/15/2023. MEDICATIONS: Ancef  2 gm IV; Antibiotics were administered within 1 hour of the procedure. 25 mg Benadryl  IV. CONTRAST:  20 mL of Isovue 300 administered into the gastric lumen. ANESTHESIA/SEDATION: Moderate (conscious) sedation was employed during this procedure. A total of Versed  1 mg and Fentanyl  50 mcg was administered intravenously. Moderate Sedation Time: 20 minutes. The patient's level of  consciousness and vital signs were monitored continuously by radiology nursing throughout the procedure under my direct supervision. FLUOROSCOPY TIME:  Fluoroscopic dose; 6.6 mGy COMPLICATIONS: None immediate. PROCEDURE: Informed written consent was obtained from the patient and/or patient's representative following explanation of the procedure, risks, benefits and alternatives. A  time out was performed prior to the initiation of the procedure. Maximal barrier sterile technique utilized including caps, mask, sterile gowns, sterile gloves, large sterile drape, hand hygiene and sterile prep. The LEFT upper quadrant was sterilely prepped and draped. A oral gastric catheter was inserted into the stomach under fluoroscopy. The LEFT costal margin and barium opacified transverse colon were identified and avoided. Air was injected into the stomach for insufflation and visualization under fluoroscopy. Under sterile conditions and local anesthesia, 2 T tacks were utilized to pexy the anterior aspect of the stomach against the ventral abdominal wall. Contrast injection confirmed appropriate positioning of each of the T tacks. An incision was made between the T tacks and a 17 gauge trocar needle was utilized to access the stomach. Needle position was confirmed within the stomach with aspiration of air and injection of a small amount of contrast. A stiff guidewire was advanced into the gastric lumen and under intermittent fluoroscopic guidance, the access needle was exchanged for a telescoping peel-away sheath, ultimately allowing placement of a 16 Fr balloon retention gastrostomy tube. The retention balloon was insufflated with a mixture of dilute saline and contrast and pulled taut against the anterior wall of the stomach. The external disc was cinched. Contrast injection confirms positioning within the stomach. Several spot radiographic images were obtained in various obliquities for documentation. The patient tolerated procedure well without immediate post procedural complication. FINDINGS: After successful fluoroscopic guided placement, the gastrostomy tube is appropriately positioned with internal retention balloon against the ventral aspect of the gastric lumen. IMPRESSION: Successful fluoroscopic insertion of a 16 Fr balloon retention gastrostomy tube. The gastrostomy may be used immediately for  medication administration and in 4 hrs for the initiation of feeds. RECOMMENDATIONS: The patient will return to Vascular Interventional Radiology (VIR) for routine feeding tube evaluation and exchange in 6 months. Thom Hall, MD [facility] Radiology Electronically Signed   By: Thom Hall M.D.   On: 05/07/2023 17:07   IR Naso G Tube Plc W/FL W/Rad Result Date: 05/07/2023 INDICATION: Chemo/radiation for head and neck cancer EXAM: Procedures: 1. FLUOROSCOPIC NASOGASTRIC TUBE PLACEMENT 2. PERCUTANEOUS GASTROSTOMY FEEDING TUBE PLACEMENT COMPARISON:  PET-CT, 04/15/2023. MEDICATIONS: Ancef  2 gm IV; Antibiotics were administered within 1 hour of the procedure. 25 mg Benadryl  IV. CONTRAST:  20 mL of Isovue 300 administered into the gastric lumen. ANESTHESIA/SEDATION: Moderate (conscious) sedation was employed during this procedure. A total of Versed  1 mg and Fentanyl  50 mcg was administered intravenously. Moderate Sedation Time: 20 minutes. The patient's level of consciousness and vital signs were monitored continuously by radiology nursing throughout the procedure under my direct supervision. FLUOROSCOPY TIME:  Fluoroscopic dose; 6.6 mGy COMPLICATIONS: None immediate. PROCEDURE: Informed written consent was obtained from the patient and/or patient's representative following explanation of the procedure, risks, benefits and alternatives. A time out was performed prior to the initiation of the procedure. Maximal barrier sterile technique utilized including caps, mask, sterile gowns, sterile gloves, large sterile drape, hand hygiene and sterile prep. The LEFT upper quadrant was sterilely prepped and draped. A oral gastric catheter was inserted into the stomach under fluoroscopy. The LEFT costal margin and barium opacified transverse colon were identified and avoided.  Air was injected into the stomach for insufflation and visualization under fluoroscopy. Under sterile  conditions and local anesthesia, 2 T tacks were utilized to pexy the anterior aspect of the stomach against the ventral abdominal wall. Contrast injection confirmed appropriate positioning of each of the T tacks. An incision was made between the T tacks and a 17 gauge trocar needle was utilized to access the stomach. Needle position was confirmed within the stomach with aspiration of air and injection of a small amount of contrast. A stiff guidewire was advanced into the gastric lumen and under intermittent fluoroscopic guidance, the access needle was exchanged for a telescoping peel-away sheath, ultimately allowing placement of a 16 Fr balloon retention gastrostomy tube. The retention balloon was insufflated with a mixture of dilute saline and contrast and pulled taut against the anterior wall of the stomach. The external disc was cinched. Contrast injection confirms positioning within the stomach. Several spot radiographic images were obtained in various obliquities for documentation. The patient tolerated procedure well without immediate post procedural complication. FINDINGS: After successful fluoroscopic guided placement, the gastrostomy tube is appropriately positioned with internal retention balloon against the ventral aspect of the gastric lumen. IMPRESSION: Successful fluoroscopic insertion of a 16 Fr balloon retention gastrostomy tube. The gastrostomy may be used immediately for medication administration and in 4 hrs for the initiation of feeds. RECOMMENDATIONS: The patient will return to Vascular Interventional Radiology (VIR) for routine feeding tube evaluation and exchange in 6 months. Thom Hall, MD [facility] Radiology Electronically Signed   By: Thom Hall M.D.   On: 05/07/2023 17:07   IR IMAGING GUIDED PORT INSERTION Result Date: 05/07/2023 INDICATION: Chemotherapy for head and neck cancer EXAM: IMPLANTED PORT A CATH PLACEMENT WITH ULTRASOUND  AND FLUOROSCOPIC GUIDANCE MEDICATIONS: 25 mg Benadryl  IV ANESTHESIA/SEDATION: Moderate (conscious) sedation was employed during this procedure. A total of Versed  1 mg and Fentanyl  25 mcg was administered intravenously. Moderate Sedation Time: 24 minutes. The patient's level of consciousness and vital signs were monitored continuously by radiology nursing throughout the procedure under my direct supervision. FLUOROSCOPY TIME:  Fluoroscopic dose; 2.5 mGy COMPLICATIONS: None immediate. PROCEDURE: The procedure, risks, benefits, and alternatives were explained to the patient. Questions regarding the procedure were encouraged and answered. The patient understands and consents to the procedure. The RIGHT neck and chest were prepped with chlorhexidine in a sterile fashion, and a sterile drape was applied covering the operative field. Maximum barrier sterile technique with sterile gowns and gloves were used for the procedure. A timeout was performed prior to the initiation of the procedure. Local anesthesia was provided with 1% lidocaine  with epinephrine . After creating a small venotomy incision, a micropuncture kit was utilized to access the internal jugular vein under direct, real-time ultrasound guidance. Ultrasound image documentation was performed. The microwire was kinked to measure appropriate catheter length. A subcutaneous port pocket was then created along the upper chest wall utilizing a combination of sharp and blunt dissection. The pocket was irrigated with sterile saline. A single lumen power injectable port was chosen for placement. The 8 Fr catheter was tunneled from the port pocket site to the venotomy incision. The port was placed in the pocket. The external catheter was trimmed to appropriate length. At the venotomy, an 8 Fr peel-away sheath was placed over a guidewire under fluoroscopic guidance. The catheter was then placed through the sheath and the sheath was removed. Final catheter positioning was  confirmed and documented with a fluoroscopic spot radiograph. The port  was accessed with a Huber needle, aspirated and flushed with heparinized saline. The port pocket incision was closed with interrupted 3-0 Vicryl suture then Dermabond was applied, including at the venotomy incision. Dressings were placed. The patient tolerated the procedure well without immediate post procedural complication. IMPRESSION: Successful placement of a RIGHT internal jugular approach power injectable Port-A-Cath. The tip of the catheter is positioned within the proximal RIGHT atrium. The catheter is ready for immediate use. Thom Hall, MD [facility] Electronically Signed   By: Thom Hall M.D.   On: 05/07/2023 17:02   NM PET Image Initial (PI) Skull Base To Thigh (F-18 FDG) Result Date: 04/15/2023 CLINICAL DATA:  Initial treatment strategy for squamous cell carcinoma head neck. EXAM: NUCLEAR MEDICINE PET SKULL BASE TO THIGH TECHNIQUE: 10.1 mCi F-18 FDG was injected intravenously. Full-ring PET imaging was performed from the skull base to thigh after the radiotracer. CT data was obtained and used for attenuation correction and anatomic localization. Fasting blood glucose: 80 mg/dl COMPARISON:  None FINDINGS: NECK: Asymmetric hypermetabolic activity at the level of the RIGHT lingual tonsil with SUV max equal 10 8 image 28. There is asymmetric thickening associated metabolic activity measuring 10 mm x 8 mm on image 29/4). Activity is localized to mucosal surface Hypermetabolic enlarged RIGHT level 2 lymph node measures 2.6 cm on image 37. The central portion of this enlarged node is hypermetabolic with SUV max equal 11.6. the node appears inflamed. Incidental CT findings: None. CHEST: No hypermetabolic mediastinal or hilar nodes. No suspicious pulmonary nodules on the CT scan. Incidental CT findings: None. ABDOMEN/PELVIS: No abnormal hypermetabolic activity within the liver,  pancreas, adrenal glands, or spleen. No hypermetabolic lymph nodes in the abdomen or pelvis. Incidental CT findings: Postcholecystectomy. Hernia repair ventral peritoneal space above the pubic symphysis. SKELETON: Healed fracture of the LEFT inferior pubic ramus. No suspicious lesion Incidental CT findings: None. IMPRESSION: 1. Hypermetabolic activity at the level of the RIGHT lingual tonsil consistent with primary head and neck squamous cell carcinoma. 2. Hypermetabolic enlarged RIGHT level II lymph node consistent with nodal metastasis. The node appears inflamed. 3. No evidence distant metastatic disease. Electronically Signed   By: Jackquline Boxer M.D.   On: 04/15/2023 16:43    CODE STATUS:  Code Status History     Date Active Date Inactive Code Status Order ID Comments User Context   05/07/2023 1642 05/08/2023 0512 Full Code [id number]  Hall Thom, MD [facility]   05/07/2023 1642 05/07/2023 1642 Full Code [id number]  Hall Thom, MD [facility]    Questions for Most Recent Historical Code Status (Order [id number])     Question Answer   By: Consent: discussion documented in EHR            Orders Placed This Encounter  Procedures   CBC with Differential (Cancer Center Only)    Standing Status:   Future    Expected Date:   06/18/2023    Expiration Date:   06/17/2024   Basic Metabolic Panel - Cancer Center Only    Standing Status:   Future    Expected Date:   06/18/2023    Expiration Date:   06/17/2024   Magnesium     Standing Status:   Future    Expected Date:   06/18/2023    Expiration Date:   06/17/2024   CBC with Differential (Cancer Center Only)    Standing Status:   Future    Expected Date:   06/25/2023    Expiration  Date:   06/24/2024   Basic Metabolic Panel - Cancer Center Only    Standing Status:   Future    Expected Date:   06/25/2023    Expiration Date:   06/24/2024   Magnesium     Standing Status:   Future    Expected Date:   06/25/2023    Expiration Date:   06/24/2024     Future  Appointments  Date Time Provider Department Center  05/14/2023  7:30 AM [facility] LINAC 4 [facility] None  05/14/2023  8:00 AM [facility] INFUSION [facility] None  05/14/2023 10:30 AM Ivonne Harlene RAMAN, RD [facility] None  05/17/2023  3:30 PM [facility] LINAC 3 [facility] None  05/18/2023  7:30 AM [facility] LINAC 4 [facility] None  05/19/2023  3:45 PM [facility] LINAC 4 [facility] None  05/20/2023  8:15 AM [facility] LINAC 3 [facility] None  05/20/2023  8:45 AM [facility] FLUSH [facility] None  05/20/2023  9:20 AM Teejay Meader, MD [facility] None  05/21/2023  7:30 AM [facility] LINAC 4 [facility] None  05/21/2023  8:00 AM [facility] INFUSION [facility] None  05/21/2023  9:45 AM Ivonne Harlene RAMAN, RD [facility] None  05/24/2023  4:00 PM [facility] LINAC 4 [facility] None  05/25/2023  4:15 PM [facility] [id number] [facility] None  05/26/2023  8:15 AM [facility] [id number] [facility] None  05/26/2023  9:00 AM [facility] FLUSH [facility] None  05/26/2023  9:40 AM Waver Dibiasio, MD [facility] None  05/27/2023  4:15 PM [facility] [id number] [facility] None  05/28/2023  8:15 AM [facility] [id number] [facility] None  05/28/2023  9:00 AM [facility] INFUSION [facility] None  05/28/2023  9:45 AM Ivonne Harlene RAMAN, RD [facility] None  05/31/2023  4:15 PM [facility] [id number] [facility] None  06/01/2023  4:15 PM [facility] [id number] [facility] None  06/02/2023  8:15 AM [facility] [id number] [facility] None  06/02/2023  9:45 AM [facility] FLUSH [facility] None  06/02/2023 10:20 AM Traeson Dusza, MD [facility] None  06/03/2023  4:15 PM [facility] [id number] [facility] None  06/04/2023  8:15 AM [facility] [id number] [facility] None  06/04/2023  9:00 AM [facility] INFUSION [facility] None  06/04/2023 10:30 AM Ivonne Harlene RAMAN, RD [facility] None  06/07/2023  4:15 PM [facility] [id number] [facility] None  06/08/2023  4:15 PM [facility] [id number] [facility] None  06/09/2023  4:15  PM [facility] [id number] [facility] None  06/10/2023 10:45 AM [facility] [id number] [facility] None  06/10/2023 11:15 AM [facility] FLUSH [facility] None  06/10/2023 11:40 AM Kaycen Whitworth, MD [facility] None  06/11/2023  8:15 AM [facility] LINAC 3 [facility] None  06/11/2023  9:00 AM [facility] INFUSION [facility] None  06/11/2023  9:45 AM Ivonne Harlene RAMAN, RD [facility] None  06/14/2023  4:15 PM [facility] [id number] [facility] None  06/15/2023  4:15 PM [facility] [id number] [facility] None  06/16/2023  4:15 PM [facility] [id number] [facility] None  06/17/2023  2:45 PM [facility] FLUSH [facility] None  06/17/2023  3:20 PM Paulino Cork, MD [facility] None  06/17/2023  4:15 PM [facility] [id number] [facility] None  06/18/2023 10:00 AM [facility] INFUSION [facility] None  06/18/2023  4:15 PM [facility] [id number] [facility] None  06/21/2023  4:15 PM [facility] [id number] [facility] None  06/22/2023  4:00 PM [facility] LINAC 4 [facility] None  06/23/2023  4:15 PM [facility] [id number] [facility] None  06/24/2023  3:15 PM [facility] FLUSH [facility] None  06/24/2023  3:40 PM Arad Burston, MD [facility] None  06/24/2023  4:15 PM [facility] [id number] [facility] None  06/25/2023  4:15 PM [facility] [id number] [facility] None      This document was completed utilizing speech recognition  software. Grammatical errors, random word insertions, pronoun errors, and incomplete sentences are an occasional consequence of this system due to software limitations, ambient noise, and hardware issues. Any formal questions or concerns about the content, text or information contained within the body of this dictation should be directly addressed to the provider for clarification.

## 2023-05-13 NOTE — Assessment & Plan Note (Signed)
-   Please review oncology history for additional details and timeline of events.  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 Unknown p16 status.  Clinically cT2,cN1,cM0.  There is concern for extracapsular extension based on imaging.  Discussed his case in tumor conference on 04/21/2023. Given the size and nature of the lymph node with concern for extracapsular extension, he has a high likelihood of requiring adjuvant chemoradiation if removed surgically.  Hence consensus opinion is to proceed with concurrent chemoradiation.  It was decided to not pursue another biopsy in an attempt to determine p16 status, since the treatment plan would not change.  -Previously I discussed tumor board recommendations with the patient.  He was agreeable to proceeding with concurrent chemoradiation. We have discussed about role of cisplatin  being a radiosensitizer in the treatment of head and neck cancer.  We have discussed about the curative intent of chemoradiation for this patient.     We have discussed about mechanism of action of cisplatin , adverse effects of cisplatin  including but not limited to fatigue, nausea, vomiting, increased risk of infections, mucositis, ototoxicity, nephrotoxicity, peripheral neuropathy.  Patient understands that some of the side effects can be permanent and even potentially fatal.  We have discussed about role of Mediport and G-tube for chemotherapy administration and nutrition respectively since most of these patients have severe mucositis during the treatment.  Patient is willing to proceed with weekly cisplatin .  Cisplatin  will be given at 40 mg/m IV dose weekly during the course of radiation.  He started radiation earlier this week from 05/10/2023.  Tolerating it well so far.  -Labs today revealed no dose-limiting toxicities.  White count is now normal at 4300 with normal  differential.  Will proceed with cycle 1 of cisplatin  tomorrow, 05/14/2023 as scheduled.  Patient has nausea medications available at home.  He has established with dietitian for supportive care.

## 2023-05-14 ENCOUNTER — Other Ambulatory Visit: Payer: Self-pay

## 2023-05-14 ENCOUNTER — Inpatient Hospital Stay: Payer: No Typology Code available for payment source | Admitting: Dietician

## 2023-05-14 ENCOUNTER — Ambulatory Visit
Admission: RE | Admit: 2023-05-14 | Discharge: 2023-05-14 | Disposition: A | Discharge status: Home / Self Care | Payer: No Typology Code available for payment source | Source: Ambulatory Visit | Attending: Radiation Oncology

## 2023-05-14 ENCOUNTER — Inpatient Hospital Stay: Payer: No Typology Code available for payment source

## 2023-05-14 VITALS — BP 134/74 | HR 64 | Temp 98.2°F | Resp 16

## 2023-05-14 DIAGNOSIS — C024 Malignant neoplasm of lingual tonsil: Secondary | ICD-10-CM

## 2023-05-14 DIAGNOSIS — Z5111 Encounter for antineoplastic chemotherapy: Secondary | ICD-10-CM | POA: Diagnosis not present

## 2023-05-14 LAB — RAD ONC ARIA SESSION SUMMARY
Course Elapsed Days: 4
Plan Fractions Treated to Date: 5
Plan Prescribed Dose Per Fraction: 2 Gy
Plan Total Fractions Prescribed: 35
Plan Total Prescribed Dose: 70 Gy
Reference Point Dosage Given to Date: 10 Gy
Reference Point Session Dosage Given: 2 Gy
Session Number: 5

## 2023-05-14 MED ORDER — FOSAPREPITANT DIMEGLUMINE INJECTION 150 MG
150.0000 mg | Freq: Once | INTRAVENOUS | Status: AC
  Administered 2023-05-14: 150 mg via INTRAVENOUS
  Filled 2023-05-14: qty 150

## 2023-05-14 MED ORDER — PALONOSETRON HCL INJECTION 0.25 MG/5ML
0.2500 mg | Freq: Once | INTRAVENOUS | Status: AC
  Administered 2023-05-14: 0.25 mg via INTRAVENOUS
  Filled 2023-05-14: qty 5

## 2023-05-14 MED ORDER — HEPARIN SOD (PORK) LOCK FLUSH 100 UNIT/ML IV SOLN
500.0000 [IU] | Freq: Once | INTRAVENOUS | Status: AC | PRN
Start: 2023-05-14 — End: 2023-05-14
  Administered 2023-05-14: 500 [IU]

## 2023-05-14 MED ORDER — SODIUM CHLORIDE 0.9 % IV SOLN: INTRAVENOUS | Status: DC

## 2023-05-14 MED ORDER — MAGNESIUM SULFATE 2 GM/50ML IV SOLN
2.0000 g | Freq: Once | INTRAVENOUS | Status: AC
  Administered 2023-05-14: 2 g via INTRAVENOUS
  Filled 2023-05-14: qty 50

## 2023-05-14 MED ORDER — POTASSIUM CHLORIDE IN NACL 20-0.9 MEQ/L-% IV SOLN
Freq: Once | INTRAVENOUS | Status: AC
  Filled 2023-05-14: qty 1000

## 2023-05-14 MED ORDER — SODIUM CHLORIDE 0.9 % IV SOLN
40.0000 mg/m2 | Freq: Once | INTRAVENOUS | Status: AC
  Administered 2023-05-14: 88 mg via INTRAVENOUS
  Filled 2023-05-14: qty 88

## 2023-05-14 MED ORDER — SODIUM CHLORIDE 0.9% FLUSH
10.0000 mL | INTRAVENOUS | Status: DC | PRN
  Administered 2023-05-14: 10 mL

## 2023-05-14 MED ORDER — DEXAMETHASONE SODIUM PHOSPHATE 10 MG/ML IJ SOLN
10.0000 mg | Freq: Once | INTRAMUSCULAR | Status: AC
  Administered 2023-05-14: 10 mg via INTRAVENOUS
  Filled 2023-05-14: qty 1

## 2023-05-14 NOTE — Progress Notes (Signed)
 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 He is receiving concurrent chemoradiation (RT start 2/3, chemo 2/7). Patient is under the care of Dr. Izell and Dr. Autumn.   Pertinent past medical history includes cholecystectomy   S/p PEG 1/31  Met with patient and wife in infusion. Patient reports good appetite and eating well. He is surprised that weights have decreased. Patient eats chicken and fish, but mainly vegetarian. Yesterday he had 2 eggs with cheese, peanut butter toast, baked chicken, potato chips with salsa, 2 chicken/bean burritos, and ice cream. Wife reports pt avoids carbs when possible. He drinks lots of water and is giving FWF 2x/day. He denies nutrition impact symptoms at this time.   Nutrition Focused Physical Exam: deferred  Medications: proscar, zofran , compazine , cialis   Labs: reviewed    Anthropometrics:   Height: 6' Weight: 206 lb 8 oz  UBW: 211 lb (1/14) BMI: 28.01   Estimated Energy Needs  Kcals: 2700-3000 Protein: 122-140 Fluid: >2.7 L   NUTRITION DIAGNOSIS: Unintended wt loss related to [facility] as evidenced by 2% wt loss in 3 weeks - insignificant for time frame   INTERVENTION:  Educated on importance of adequate calorie and protein energy intake to minimize loss of LBM Continue eating smaller more frequent meals, recommend protein source with every meal - offered suggestions, handout with snack ideas, shake recipes, high protein foods provided Continue providing daily PEG care + FWF Suggested trying ONS for added calories/protein, recommend once daily as tolerated - samples of Ensure Complete + Mallie Pinion provided Discussed importance of oral care, educated on baking soda salt water rinses - patient will start this today - handout with recipe provided  Contact information    MONITORING, EVALUATION, GOAL: Patient will tolerate increased calories and protein  to minimize wt loss during treatment    Next Visit: Thursday February 14 during infusion

## 2023-05-14 NOTE — Progress Notes (Signed)
 [facility] Clinical Social Work  Clinical Social Work was referred by new patient protocol for assessment of psychosocial needs.  Clinical Social Worker met with patient and spouse during infusion to offer support and assess for needs. Patient and Spouse have network of friends and family who will help transport and with needs as they arise. Patient is still active and workouts daily, and energy is feeling steady as of now. Patient currently works and has flexible schedule with benefits to help with treatment. No SDOH concerns. CSW will schedule f/u on 2/21   Lizbeth Sprague, LCSW  Clinical Social Worker [facility]

## 2023-05-14 NOTE — Patient Instructions (Signed)
 [facility] - A DEPT OF [facility]  Discharge Instructions: Thank you for choosing [facility] to provide your oncology and hematology care.   If you have a lab appointment with the Cancer Center, please go directly to the Cancer Center and check in at the registration area.   Wear comfortable clothing and clothing appropriate for easy access to any Portacath or PICC line.   We strive to give you quality time with your provider. You may need to reschedule your appointment if you arrive late (15 or more minutes).  Arriving late affects you and other patients whose appointments are after yours.  Also, if you miss three or more appointments without notifying the office, you may be dismissed from the clinic at the provider's discretion.      For prescription refill requests, have your pharmacy contact our office and allow 72 hours for refills to be completed.    Today you received the following chemotherapy and/or immunotherapy agents: Cisplatin      To help prevent nausea and vomiting after your treatment, we encourage you to take your nausea medication as directed.  BELOW ARE SYMPTOMS THAT SHOULD BE REPORTED IMMEDIATELY: *FEVER GREATER THAN 100.4 F (38 C) OR HIGHER *CHILLS OR SWEATING *NAUSEA AND VOMITING THAT IS NOT CONTROLLED WITH YOUR NAUSEA MEDICATION *UNUSUAL SHORTNESS OF BREATH *UNUSUAL BRUISING OR BLEEDING *URINARY PROBLEMS (pain or burning when urinating, or frequent urination) *BOWEL PROBLEMS (unusual diarrhea, constipation, pain near the anus) TENDERNESS IN MOUTH AND THROAT WITH OR WITHOUT PRESENCE OF ULCERS (sore throat, sores in mouth, or a toothache) UNUSUAL RASH, SWELLING OR PAIN  UNUSUAL VAGINAL DISCHARGE OR ITCHING   Items with * indicate a potential emergency and should be followed up as soon as possible or go to the Emergency Department if any problems should occur.  Please show the CHEMOTHERAPY ALERT CARD or IMMUNOTHERAPY  ALERT CARD at check-in to the Emergency Department and triage nurse.  Should you have questions after your visit or need to cancel or reschedule your appointment, please contact [facility] - A DEPT OF [facility]  Dept: [phone]  and follow the prompts.  Office hours are 8:00 a.m. to 4:30 p.m. Monday - Friday. Please note that voicemails left after 4:00 p.m. may not be returned until the following business day.  We are closed weekends and major holidays. You have access to a nurse at all times for urgent questions. Please call the main number to the clinic Dept: [phone] and follow the prompts.   For any non-urgent questions, you may also contact your provider using MyChart. We now offer e-Visits for anyone 65 and older to request care online for non-urgent symptoms. For details visit [email or web address].   Also download the MyChart app! Go to the app store, search "MyChart", open the app, select [place], and log in with your MyChart username and password.  Cisplatin Injection What is this medication? CISPLATIN (SIS pla tin) treats some types of cancer. It works by slowing down the growth of cancer cells. This medicine may be used for other purposes; ask your health care provider or pharmacist if you have questions. COMMON BRAND NAME(S): Platinol, Platinol -AQ What should I tell my care team before I take this medication? They need to know if you have any of these conditions: Eye disease, vision problems Hearing problems Kidney disease Low blood counts, such as low white cells, platelets, or red  blood cells Tingling of the fingers or toes, or other nerve disorder An unusual or allergic reaction to cisplatin, carboplatin, oxaliplatin, other medications, foods, dyes, or preservatives If you or your partner are pregnant or trying to get pregnant Breast-feeding How should I use this medication? This medication is injected into a vein. It is given  by your care team in a hospital or clinic setting. Talk to your care team about the use of this medication in children. Special care may be needed. Overdosage: If you think you have taken too much of this medicine contact a poison control center or emergency room at once. NOTE: This medicine is only for you. Do not share this medicine with others. What if I miss a dose? Keep appointments for follow-up doses. It is important not to miss your dose. Call your care team if you are unable to keep an appointment. What may interact with this medication? Do not take this medication with any of the following: Live virus vaccines This medication may also interact with the following: Certain antibiotics, such as amikacin, gentamicin, neomycin, polymyxin B, streptomycin, tobramycin, vancomycin Foscarnet This list may not describe all possible interactions. Give your health care provider a list of all the medicines, herbs, non-prescription drugs, or dietary supplements you use. Also tell them if you smoke, drink alcohol, or use illegal drugs. Some items may interact with your medicine. What should I watch for while using this medication? Your condition will be monitored carefully while you are receiving this medication. You may need blood work done while taking this medication. This medication may make you feel generally unwell. This is not uncommon, as chemotherapy can affect healthy cells as well as cancer cells. Report any side effects. Continue your course of treatment even though you feel ill unless your care team tells you to stop. This medication may increase your risk of getting an infection. Call your care team for advice if you get a fever, chills, sore throat, or other symptoms of a cold or flu. Do not treat yourself. Try to avoid being around people who are sick. Avoid taking medications that contain aspirin, acetaminophen, ibuprofen, naproxen, or ketoprofen unless instructed by your care team. These  medications may hide a fever. This medication may increase your risk to bruise or bleed. Call your care team if you notice any unusual bleeding. Be careful brushing or flossing your teeth or using a toothpick because you may get an infection or bleed more easily. If you have any dental work done, tell your dentist you are receiving this medication. Drink fluids as directed while you are taking this medication. This will help protect your kidneys. Call your care team if you get diarrhea. Do not treat yourself. Talk to your care team if you or your partner wish to become pregnant or think you might be pregnant. This medication can cause serious birth defects if taken during pregnancy and for 14 months after the last dose. A negative pregnancy test is required before starting this medication. A reliable form of contraception is recommended while taking this medication and for 14 months after the last dose. Talk to your care team about effective forms of contraception. Do not father a child while taking this medication and for 11 months after the last dose. Use a condom during sex during this time period. Do not breast-feed while taking this medication. This medication may cause infertility. Talk to your care team if you are concerned about your fertility. What side effects may I  notice from receiving this medication? Side effects that you should report to your care team as soon as possible: Allergic reactions--skin rash, itching, hives, swelling of the face, lips, tongue, or throat Eye pain, change in vision, vision loss Hearing loss, ringing in ears Infection--fever, chills, cough, sore throat, wounds that don't heal, pain or trouble when passing urine, general feeling of discomfort or being unwell Kidney injury--decrease in the amount of urine, swelling of the ankles, hands, or feet Low red blood cell level--unusual weakness or fatigue, dizziness, headache, trouble breathing Painful swelling, warmth,  or redness of the skin, blisters or sores at the infusion site Pain, tingling, or numbness in the hands or feet Unusual bruising or bleeding Side effects that usually do not require medical attention (report to your care team if they continue or are bothersome): Hair loss Nausea Vomiting This list may not describe all possible side effects. Call your doctor for medical advice about side effects. You may report side effects to FDA at 1-800-FDA-1088. Where should I keep my medication? This medication is given in a hospital or clinic. It will not be stored at home. NOTE: This sheet is a summary. It may not cover all possible information. If you have questions about this medicine, talk to your doctor, pharmacist, or health care provider.  2024 Elsevier/Gold Standard (2021-07-25 00:00:00)

## 2023-05-17 ENCOUNTER — Telehealth: Payer: Self-pay

## 2023-05-17 ENCOUNTER — Ambulatory Visit
Admission: RE | Admit: 2023-05-17 | Discharge: 2023-05-17 | Disposition: A | Discharge status: Home / Self Care | Payer: No Typology Code available for payment source | Source: Ambulatory Visit | Attending: Radiation Oncology | Admitting: Radiation Oncology

## 2023-05-17 ENCOUNTER — Other Ambulatory Visit: Payer: Self-pay | Admitting: Radiation Oncology

## 2023-05-17 ENCOUNTER — Ambulatory Visit: Payer: No Typology Code available for payment source

## 2023-05-17 ENCOUNTER — Other Ambulatory Visit: Payer: Self-pay

## 2023-05-17 DIAGNOSIS — Z5111 Encounter for antineoplastic chemotherapy: Secondary | ICD-10-CM | POA: Diagnosis not present

## 2023-05-17 DIAGNOSIS — C099 Malignant neoplasm of tonsil, unspecified: Secondary | ICD-10-CM

## 2023-05-17 LAB — RAD ONC ARIA SESSION SUMMARY
Course Elapsed Days: 7
Plan Fractions Treated to Date: 6
Plan Prescribed Dose Per Fraction: 2 Gy
Plan Total Fractions Prescribed: 35
Plan Total Prescribed Dose: 70 Gy
Reference Point Dosage Given to Date: 12 Gy
Reference Point Session Dosage Given: 2 Gy
Session Number: 6

## 2023-05-17 MED ORDER — LIDOCAINE VISCOUS HCL 2 % MT SOLN
OROMUCOSAL | 3 refills | Status: DC
Start: 2023-05-17 — End: 2023-10-13

## 2023-05-17 NOTE — Telephone Encounter (Signed)
-----   Message from Nurse Nellie Banas A sent at 05/14/2023  2:38 PM EST ----- Regarding: First time- Dr. Randye Buttner 05/14/23- First time cisplatin . Tolerated well. Dr. Pasam

## 2023-05-17 NOTE — Telephone Encounter (Signed)
 Randy Kaufman states that he is doing fine. He is eating, drinking, and urinating well. He knows to call the office at [phone] if he has any questions or concerns.

## 2023-05-18 ENCOUNTER — Ambulatory Visit
Admission: RE | Admit: 2023-05-18 | Discharge: 2023-05-18 | Disposition: A | Discharge status: Home / Self Care | Payer: No Typology Code available for payment source | Source: Ambulatory Visit | Attending: Radiation Oncology

## 2023-05-18 ENCOUNTER — Other Ambulatory Visit: Payer: Self-pay

## 2023-05-18 ENCOUNTER — Other Ambulatory Visit (HOSPITAL_COMMUNITY): Payer: No Typology Code available for payment source

## 2023-05-18 DIAGNOSIS — Z5111 Encounter for antineoplastic chemotherapy: Secondary | ICD-10-CM | POA: Diagnosis not present

## 2023-05-18 LAB — RAD ONC ARIA SESSION SUMMARY
Course Elapsed Days: 8
Plan Fractions Treated to Date: 7
Plan Prescribed Dose Per Fraction: 2 Gy
Plan Total Fractions Prescribed: 35
Plan Total Prescribed Dose: 70 Gy
Reference Point Dosage Given to Date: 14 Gy
Reference Point Session Dosage Given: 2 Gy
Session Number: 7

## 2023-05-18 NOTE — Progress Notes (Signed)
Oncology Nurse Navigator Documentation   I met Mr. Proto to remove the t-tacs placed during his feeding tube insertion on 05/07/23. There was one remaining which I removed without difficulty. The feeding tube site is clean without any signs of infection. He knows to call me if I can help with any concerns as he continues through treatment.   Hedda Slade RN, BSN, OCN Head & Neck Oncology Nurse Navigator [facility] Phone # [phone]  Fax # [phone]

## 2023-05-19 ENCOUNTER — Other Ambulatory Visit: Payer: Self-pay

## 2023-05-19 ENCOUNTER — Ambulatory Visit
Admission: RE | Admit: 2023-05-19 | Discharge: 2023-05-19 | Disposition: A | Discharge status: Home / Self Care | Payer: No Typology Code available for payment source | Source: Ambulatory Visit | Attending: Radiation Oncology | Admitting: Radiation Oncology

## 2023-05-19 DIAGNOSIS — Z5111 Encounter for antineoplastic chemotherapy: Secondary | ICD-10-CM | POA: Diagnosis not present

## 2023-05-19 LAB — RAD ONC ARIA SESSION SUMMARY
Course Elapsed Days: 9
Plan Fractions Treated to Date: 8
Plan Prescribed Dose Per Fraction: 2 Gy
Plan Total Fractions Prescribed: 35
Plan Total Prescribed Dose: 70 Gy
Reference Point Dosage Given to Date: 16 Gy
Reference Point Session Dosage Given: 2 Gy
Session Number: 8

## 2023-05-20 ENCOUNTER — Inpatient Hospital Stay (HOSPITAL_BASED_OUTPATIENT_CLINIC_OR_DEPARTMENT_OTHER): Payer: No Typology Code available for payment source | Admitting: Oncology

## 2023-05-20 ENCOUNTER — Other Ambulatory Visit: Payer: Self-pay

## 2023-05-20 ENCOUNTER — Ambulatory Visit (HOSPITAL_BASED_OUTPATIENT_CLINIC_OR_DEPARTMENT_OTHER)
Admission: RE | Admit: 2023-05-20 | Discharge: 2023-05-20 | Disposition: A | Discharge status: Home / Self Care | Payer: No Typology Code available for payment source | Source: Ambulatory Visit | Attending: Oncology | Admitting: Oncology

## 2023-05-20 ENCOUNTER — Other Ambulatory Visit: Payer: No Typology Code available for payment source

## 2023-05-20 ENCOUNTER — Inpatient Hospital Stay: Payer: No Typology Code available for payment source

## 2023-05-20 ENCOUNTER — Encounter: Payer: Self-pay | Admitting: Oncology

## 2023-05-20 ENCOUNTER — Ambulatory Visit
Admission: RE | Admit: 2023-05-20 | Discharge: 2023-05-20 | Disposition: A | Discharge status: Home / Self Care | Payer: No Typology Code available for payment source | Source: Ambulatory Visit | Attending: Radiation Oncology | Admitting: Radiation Oncology

## 2023-05-20 VITALS — BP 119/68 | HR 63 | Temp 98.3°F | Resp 16 | Ht 72.0 in | Wt 209.0 lb

## 2023-05-20 DIAGNOSIS — L03115 Cellulitis of right lower limb: Secondary | ICD-10-CM | POA: Diagnosis not present

## 2023-05-20 DIAGNOSIS — C024 Malignant neoplasm of lingual tonsil: Secondary | ICD-10-CM | POA: Diagnosis not present

## 2023-05-20 DIAGNOSIS — G893 Neoplasm related pain (acute) (chronic): Secondary | ICD-10-CM

## 2023-05-20 DIAGNOSIS — Z95828 Presence of other vascular implants and grafts: Secondary | ICD-10-CM

## 2023-05-20 DIAGNOSIS — M79671 Pain in right foot: Secondary | ICD-10-CM

## 2023-05-20 DIAGNOSIS — Z5111 Encounter for antineoplastic chemotherapy: Secondary | ICD-10-CM | POA: Diagnosis not present

## 2023-05-20 LAB — RAD ONC ARIA SESSION SUMMARY
Course Elapsed Days: 10
Plan Fractions Treated to Date: 9
Plan Prescribed Dose Per Fraction: 2 Gy
Plan Total Fractions Prescribed: 35
Plan Total Prescribed Dose: 70 Gy
Reference Point Dosage Given to Date: 18 Gy
Reference Point Session Dosage Given: 2 Gy
Session Number: 9

## 2023-05-20 LAB — CBC WITH DIFFERENTIAL (CANCER CENTER ONLY)
Abs Immature Granulocytes: 0.03 10*3/uL (ref 0.00–0.07)
Basophils Absolute: 0 10*3/uL (ref 0.0–0.1)
Basophils Relative: 0 %
Eosinophils Absolute: 0.1 10*3/uL (ref 0.0–0.5)
Eosinophils Relative: 1 %
HCT: 39.2 % (ref 39.0–52.0)
Hemoglobin: 13.4 g/dL (ref 13.0–17.0)
Immature Granulocytes: 0 %
Lymphocytes Relative: 8 %
Lymphs Abs: 0.6 10*3/uL — ABNORMAL LOW (ref 0.7–4.0)
MCH: 30.6 pg (ref 26.0–34.0)
MCHC: 34.2 g/dL (ref 30.0–36.0)
MCV: 89.5 fL (ref 80.0–100.0)
Monocytes Absolute: 0.8 10*3/uL (ref 0.1–1.0)
Monocytes Relative: 12 %
Neutro Abs: 5.3 10*3/uL (ref 1.7–7.7)
Neutrophils Relative %: 79 %
Platelet Count: 184 10*3/uL (ref 150–400)
RBC: 4.38 MIL/uL (ref 4.22–5.81)
RDW: 11.1 % — ABNORMAL LOW (ref 11.5–15.5)
WBC Count: 6.8 10*3/uL (ref 4.0–10.5)
nRBC: 0 % (ref 0.0–0.2)

## 2023-05-20 LAB — MAGNESIUM: Magnesium: 1.9 mg/dL (ref 1.7–2.4)

## 2023-05-20 LAB — BASIC METABOLIC PANEL - CANCER CENTER ONLY
Anion gap: 4 — ABNORMAL LOW (ref 5–15)
BUN: 14 mg/dL (ref 8–23)
CO2: 28 mmol/L (ref 22–32)
Calcium: 9.2 mg/dL (ref 8.9–10.3)
Chloride: 105 mmol/L (ref 98–111)
Creatinine: 0.7 mg/dL (ref 0.61–1.24)
GFR, Estimated: >60 mL/min (ref >60)
Glucose, Bld: 95 mg/dL (ref 70–99)
Potassium: 4.1 mmol/L (ref 3.5–5.1)
Sodium: 137 mmol/L (ref 135–145)

## 2023-05-20 MED ORDER — SODIUM CHLORIDE 0.9% FLUSH
10.0000 mL | Freq: Once | INTRAVENOUS | Status: AC
  Administered 2023-05-20: 10 mL

## 2023-05-20 MED ORDER — SULFAMETHOXAZOLE-TRIMETHOPRIM 800-160 MG PO TABS: 1.0000 | ORAL_TABLET | Freq: Two times a day (BID) | ORAL | 0 refills | Status: DC

## 2023-05-20 MED ORDER — TRAMADOL HCL 50 MG PO TABS: 50.0000 mg | ORAL_TABLET | Freq: Four times a day (QID) | ORAL | 0 refills | Status: DC | PRN

## 2023-05-20 MED ORDER — HEPARIN SOD (PORK) LOCK FLUSH 100 UNIT/ML IV SOLN
500.0000 [IU] | Freq: Once | INTRAVENOUS | Status: AC
Start: 2023-05-20 — End: 2023-05-20
  Administered 2023-05-20: 500 [IU]

## 2023-05-20 MED FILL — Fosaprepitant Dimeglumine For IV Infusion 150 MG (Base Eq): INTRAVENOUS | Qty: 5 | Status: AC

## 2023-05-20 NOTE — Assessment & Plan Note (Addendum)
Ultrasound of the right lower extremity today showed no evidence of DVT on preliminary reading.  Will follow-up on the final read.  He does have signs of cellulitis in the right foot.  Started him on empiric antibiotic course with Bactrim DS tablet twice daily for 10 days.  We will monitor response.  - Advise alternating heat and ice application to the affected area - Advise elevating the foot and avoiding tight footwear

## 2023-05-20 NOTE — Assessment & Plan Note (Addendum)
-   Please review oncology history for additional details and timeline of events.  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 Unknown p16 status.  Clinically cT2,cN1,cM0.  There is concern for extracapsular extension based on imaging.  Discussed his case in tumor conference on 04/21/2023. Given the size and nature of the lymph node with concern for extracapsular extension, he has a high likelihood of requiring adjuvant chemoradiation if removed surgically.  Hence consensus opinion is to proceed with concurrent chemoradiation.  It was decided to not pursue another biopsy in an attempt to determine p16 status, since the treatment plan would not change.  -Previously I discussed tumor board recommendations with the patient.  He was agreeable to proceeding with concurrent chemoradiation. We have discussed about role of cisplatin being a radiosensitizer in the treatment of head and neck cancer.  We have discussed about the curative intent of chemoradiation for this patient.     We have discussed about mechanism of action of cisplatin, adverse effects of cisplatin including but not limited to fatigue, nausea, vomiting, increased risk of infections, mucositis, ototoxicity, nephrotoxicity, peripheral neuropathy.  Patient understands that some of the side effects can be permanent and even potentially fatal.  We have discussed about role of Mediport and G-tube for chemotherapy administration and nutrition respectively since most of these patients have severe mucositis during the treatment.  Patient is willing to proceed with weekly cisplatin.  Cisplatin will be given at 40 mg/m IV dose weekly during the course of radiation.  He started radiation from 05/10/2023.  Started chemotherapy with cisplatin from 2025.  Tolerated the first dose well without any major side effects.  -Labs today revealed no dose-limiting  toxicities.  White count is normal at 6800 with normal differential.  Will proceed with cycle 2 of cisplatin tomorrow, 05/21/2023 as scheduled.  Patient has nausea medications available at home.  He has established with dietitian for supportive care.  Plan is to continue cisplatin weekly during the course of radiation.

## 2023-05-20 NOTE — Progress Notes (Signed)
[facility]  ONCOLOGY CLINIC PROGRESS NOTE   Patient Care Team: Caffie Damme, MD as PCP - General (Family Medicine) Lonie Peak, MD as Attending Physician (Radiation Oncology) Serena Colonel, MD as Consulting Physician (Otolaryngology) Malmfelt, Lise Auer, RN as Oncology Nurse Navigator Meryl Crutch, MD as Consulting Physician (Oncology)  PATIENT NAME: Randy Kaufman   MR#: [id number] DOB: December 19, 1960  Date of visit: 05/20/2023   ASSESSMENT & PLAN:   Randy Kaufman is a 63 y.o.  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 Unknown p16 status.   Lingual tonsil carcinoma (HCC) - Please review oncology history for additional details and timeline of events.  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 Unknown p16 status.  Clinically cT2,cN1,cM0.  There is concern for extracapsular extension based on imaging.  Discussed his case in tumor conference on 04/21/2023. Given the size and nature of the lymph node with concern for extracapsular extension, he has a high likelihood of requiring adjuvant chemoradiation if removed surgically.  Hence consensus opinion is to proceed with concurrent chemoradiation.  It was decided to not pursue another biopsy in an attempt to determine p16 status, since the treatment plan would not change.  -Previously I discussed tumor board recommendations with the patient.  He was agreeable to proceeding with concurrent chemoradiation. We have discussed about role of cisplatin being a radiosensitizer in the treatment of head and neck cancer.  We have discussed about the curative intent of chemoradiation for this patient.     We have discussed about mechanism of action of  cisplatin, adverse effects of cisplatin including but not limited to fatigue, nausea, vomiting, increased risk of infections, mucositis, ototoxicity, nephrotoxicity, peripheral neuropathy.  Patient understands that some of the side effects can be permanent and even potentially fatal.  We have discussed about role of Mediport and G-tube for chemotherapy administration and nutrition respectively since most of these patients have severe mucositis during the treatment.  Patient is willing to proceed with weekly cisplatin.  Cisplatin will be given at 40 mg/m IV dose weekly during the course of radiation.  He started radiation from 05/10/2023.  Started chemotherapy with cisplatin from 2025.  Tolerated the first dose well without any major side effects.  -Labs today revealed no dose-limiting toxicities.  White count is normal at 6800 with normal differential.  Will proceed with cycle 2 of cisplatin tomorrow, 05/21/2023 as scheduled.  Patient has nausea medications available at home.  He has established with dietitian for supportive care.  Plan is to continue cisplatin weekly during the course of radiation.   Cellulitis of right foot Ultrasound of the right lower extremity today showed no evidence of DVT on preliminary reading.  Will follow-up on the final read.  He does have signs of cellulitis in the right foot.  Started him on empiric antibiotic course with Bactrim DS tablet twice daily for 10 days.  We will monitor response.  - Advise alternating heat and ice application to the affected area - Advise elevating the foot and avoiding tight footwear   RTC in 1 week for labs, follow-up and continuation of chemotherapy.  I reviewed lab results and outside records for this visit and discussed relevant results with the patient. Diagnosis, plan of care and treatment options were also discussed in  detail with the patient. Opportunity provided to ask questions and answers provided to his apparent  satisfaction. Provided instructions to call our clinic with any problems, questions or concerns prior to return visit. I recommended to continue follow-up with PCP and sub-specialists. He verbalized understanding and agreed with the plan.   NCCN guidelines have been consulted in the planning of this patient's care.  I spent a total of 30 minutes during this encounter with the patient including review of chart and various tests results, discussions about plan of care and coordination of care plan.   Meryl Crutch, MD  05/20/2023 3:20 PM  [facility] - [facility] [address] [place] [address] Dept: [phone] Dept Fax: [phone]    CHIEF COMPLAINT/ REASON FOR VISIT:   Squamous cell carcinoma consistent with a right lingual tonsillar primary with a metastatic ipsilateral cervical node. Clinically cT2,cN1,cM0.   Current Treatment: Concurrent chemoradiation with weekly cisplatin started during the week of 05/10/2023.  INTERVAL HISTORY:    Discussed the use of AI scribe software for clinical note transcription with the patient, who gave verbal consent to proceed.   Randy Kaufman is here today for repeat clinical assessment.   He reports severe right foot pain and swelling that began on Tuesday afternoon. The pain has progressively worsened, preventing sleep for the past two nights. The patient describes the foot as feeling 'hard' and 'swollen,' and notes difficulty walking due to the pain. He denies a history of blood clots and reports a total cholesterol level of 150. The patient follows a predominantly vegetarian diet and maintains a high water intake. He has a history of surgeries with hardware placement, but denies any pain in the areas of hardware. The patient reports no history of gout. The left foot is unaffected.  He has been managing the pain with Tylenol, but it has not been effective.  I have  reviewed the past medical history, past surgical history, social history and family history with the patient and they are unchanged from previous note.  HISTORY OF PRESENT ILLNESS:   ONCOLOGY HISTORY:   He presented to his dentist last year with c/o of a small lump to his right neck. He was subsequently referred to Dr. Pollyann Kennedy at [facility], on 04/02/23 for further evaluation. Physical exam performed at that time noted a palpable 4 cm right level 2 neck mass. No other masses were palpated.    A biopsy of the right neck mass was collected that date (04/02/23) and showed findings consistent with metastatic squamous cell carcinoma.  There was insufficient tissue to check for p16 status.   Staging PET scan on 04/14/22 revealed hypermetabolic activity at the right lingual tonsil and right level II node. The enlarged lymph node appeared to measure approximately 2.6 cm in greatest dimension. No evidence of distant metastatic disease was seen.    He later had CT soft tissue neck at [facility].   Unknown p16 status.  Clinically cT2,cN1,cM0.  There is concern for extracapsular extension based on imaging.   Discussed his case in tumor conference on 04/21/2023. Given the size and nature of the lymph node with concern for extracapsular extension, he has a high likelihood of requiring adjuvant chemoradiation if removed surgically.  Hence consensus opinion was to proceed with concurrent chemoradiation. It was decided to not pursue another biopsy in an attempt to determine p16 status, since the treatment plan  would not change.  He started radiation treatments from 05/10/2023.  Started chemotherapy with cisplatin from 05/14/2023.  Oncology History  Lingual tonsil carcinoma (HCC)  04/20/2023 Initial Diagnosis   Lingual tonsil carcinoma (HCC)   04/22/2023 Cancer Staging   Staging form: Pharynx - HPV-Mediated Oropharynx, AJCC 8th Edition - Clinical: Stage I (cT2, cN1, cM0, p16: Unknown, HPV: Unknown) -  Signed by Meryl Crutch, MD on 04/22/2023   05/14/2023 -  Chemotherapy   Patient is on Treatment Plan : HEAD/NECK Cisplatin (40) q7d         REVIEW OF SYSTEMS:   Review of Systems - Oncology  All other pertinent systems were reviewed with the patient and are negative.  ALLERGIES: He has no known allergies.  MEDICATIONS:  Current Outpatient Medications  Medication Sig Dispense Refill   Biotin 1000 MCG tablet Take 1,000 mcg by mouth 3 (three) times daily as needed (Dry mouth).     dexamethasone (DECADRON) 4 MG tablet Take 2 tablets (8 mg) by mouth daily x 3 days starting the day after cisplatin chemotherapy. Take with food. 30 tablet 1   finasteride (PROSCAR) 5 MG tablet Take 5 mg by mouth daily. Takes 1/2 Tab to equal 2.5 mg daily     ondansetron (ZOFRAN) 8 MG tablet Take 1 tablet (8 mg total) by mouth every 8 (eight) hours as needed for nausea or vomiting. Start on the third day after cisplatin. 30 tablet 1   sulfamethoxazole-trimethoprim (BACTRIM DS) 800-160 MG tablet Take 1 tablet by mouth 2 (two) times daily. 20 tablet 0   traMADol (ULTRAM) 50 MG tablet Take 1 tablet (50 mg total) by mouth every 6 (six) hours as needed. 60 tablet 0   lidocaine (XYLOCAINE) 2 % solution Patient: Mix 1part 2% viscous lidocaine, 1part H20. Swallow 10mL of diluted mixture, before meals and at bedtime, up to QID (Patient not taking: Reported on 05/20/2023) 200 mL 3   lidocaine-prilocaine (EMLA) cream Apply to affected area once (Patient not taking: Reported on 05/20/2023) 30 g 3   prochlorperazine (COMPAZINE) 10 MG tablet Take 1 tablet (10 mg total) by mouth every 6 (six) hours as needed (Nausea or vomiting). (Patient not taking: Reported on 05/20/2023) 30 tablet 1   tadalafil (CIALIS) 20 MG tablet Take 20 mg by mouth daily as needed. (Patient not taking: Reported on 05/20/2023)     No current facility-administered medications for this visit.     VITALS:   Blood pressure 119/68, pulse 63, temperature  98.3 F (36.8 C), temperature source Temporal, resp. rate 16, height 6' (1.829 m), weight 209 lb (94.8 kg), SpO2 100%.  Wt Readings from Last 3 Encounters:  05/20/23 209 lb (94.8 kg)  05/13/23 206 lb 8 oz (93.7 kg)  05/07/23 203 lb (92.1 kg)    Body mass index is 28.35 kg/m.    Onc Performance Status - 05/20/23 0933       ECOG Perf Status   ECOG Perf Status Fully active, able to carry on all pre-disease performance without restriction      KPS SCALE   KPS % SCORE Normal, no compliants, no evidence of disease             PHYSICAL EXAM:   Physical Exam Constitutional:      General: He is not in acute distress.    Appearance: Normal appearance.  HENT:     Head: Normocephalic and atraumatic.     Mouth/Throat:     Comments: Slightly enlarged right tonsil Eyes:  General: No scleral icterus.    Conjunctiva/sclera: Conjunctivae normal.  Cardiovascular:     Rate and Rhythm: Normal rate and regular rhythm.     Pulses: Normal pulses.     Heart sounds: Normal heart sounds.  Pulmonary:     Effort: Pulmonary effort is normal.     Breath sounds: Normal breath sounds.  Chest:     Comments: Right-sided Port-A-Cath in place, without any evidence of infection Abdominal:     General: There is no distension.     Comments: Feeding tube in place.  No signs of infection.  Musculoskeletal:     Left lower leg: No edema.     Comments: Right foot is minimally swollen, with erythema and warmth. No tenderness. Dorsalis pedis pulse is intact  Lymphadenopathy:     Cervical: Cervical adenopathy (hard, fixed, right sided LN ~ 4 cm) present.  Neurological:     General: No focal deficit present.     Mental Status: He is alert and oriented to person, place, and time.  Psychiatric:        Mood and Affect: Mood normal.        Behavior: Behavior normal.        Thought Content: Thought content normal.       LABORATORY DATA:   I have reviewed the data as listed.  Results for orders  placed or performed in visit on 05/20/23  CBC with Differential (Cancer Center Only)  Result Value Ref Range   WBC Count 6.8 4.0 - 10.5 K/uL   RBC 4.38 4.22 - 5.81 MIL/uL   Hemoglobin 13.4 13.0 - 17.0 g/dL   HCT 78.2 95.6 - 21.3 %   MCV 89.5 80.0 - 100.0 fL   MCH 30.6 26.0 - 34.0 pg   MCHC 34.2 30.0 - 36.0 g/dL   RDW 08.6 (L) 57.8 - 46.9 %   Platelet Count 184 150 - 400 K/uL   nRBC 0.0 0.0 - 0.2 %   Neutrophils Relative % 79 %   Neutro Abs 5.3 1.7 - 7.7 K/uL   Lymphocytes Relative 8 %   Lymphs Abs 0.6 (L) 0.7 - 4.0 K/uL   Monocytes Relative 12 %   Monocytes Absolute 0.8 0.1 - 1.0 K/uL   Eosinophils Relative 1 %   Eosinophils Absolute 0.1 0.0 - 0.5 K/uL   Basophils Relative 0 %   Basophils Absolute 0.0 0.0 - 0.1 K/uL   Immature Granulocytes 0 %   Abs Immature Granulocytes 0.03 0.00 - 0.07 K/uL  Basic Metabolic Panel - Cancer Center Only  Result Value Ref Range   Sodium 137 135 - 145 mmol/L   Potassium 4.1 3.5 - 5.1 mmol/L   Chloride 105 98 - 111 mmol/L   CO2 28 22 - 32 mmol/L   Glucose, Bld 95 70 - 99 mg/dL   BUN 14 8 - 23 mg/dL   Creatinine 6.29 5.28 - 1.24 mg/dL   Calcium 9.2 8.9 - 41.3 mg/dL   GFR, Estimated >24 >40 mL/min   Anion gap 4 (L) 5 - 15  Magnesium  Result Value Ref Range   Magnesium 1.9 1.7 - 2.4 mg/dL  Results for orders placed or performed in visit on 05/20/23  Rad Onc Aria Session Summary  Result Value Ref Range   Course ID [id number]    Course Start Date 04/30/2023    Session Number 9    Course First Treatment Date 05/10/2023  1:00 PM    Course Last Treatment Date 05/20/2023  8:33  AM    Course Elapsed Days 10    Reference Point ID HN dp    Reference Point Dosage Given to Date 18 Gy   Reference Point Session Dosage Given 2 Gy   Plan ID [id number]    Plan Fractions Treated to Date 9    Plan Total Fractions Prescribed 35    Plan Prescribed Dose Per Fraction 2 Gy   Plan Total Prescribed Dose 70.000000 Gy   Plan Primary Reference Point HN dp       RADIOGRAPHIC STUDIES:  I have personally reviewed the radiological images as listed and agree with the findings in the report.  VAS [place] LOWER EXTREMITY VENOUS (DVT) Result Date: 05/20/2023  Lower Venous DVT Study Patient Name:  Randy Kaufman  Date of Exam:   05/20/2023 Medical Rec #: [id number]          Accession #:    [id number] Date of Birth: 04-27-1960           Patient Gender: M Patient Age:   8 years Exam Location:  [facility] Procedure:      VAS [place] LOWER EXTREMITY VENOUS (DVT) Referring Phys: Archie Patten Decoda Van --------------------------------------------------------------------------------  Indications: Pain.  Risk Factors: Cancer Carcinoma. Comparison Study: None. Performing Technologist: Shona Simpson  Examination Guidelines: A complete evaluation includes B-mode imaging, spectral Doppler, color Doppler, and power Doppler as needed of all accessible portions of each vessel. Bilateral testing is considered an integral part of a complete examination. Limited examinations for reoccurring indications may be performed as noted. The reflux portion of the exam is performed with the patient in reverse Trendelenburg.  +---------+---------------+---------+-----------+----------+--------------+ RIGHT    CompressibilityPhasicitySpontaneityPropertiesThrombus Aging +---------+---------------+---------+-----------+----------+--------------+ CFV      Full           Yes      Yes                                 +---------+---------------+---------+-----------+----------+--------------+ SFJ      Full                                                        +---------+---------------+---------+-----------+----------+--------------+ FV Prox  Full                                                        +---------+---------------+---------+-----------+----------+--------------+ FV Mid   Full                                                         +---------+---------------+---------+-----------+----------+--------------+ FV DistalFull                                                        +---------+---------------+---------+-----------+----------+--------------+ PFV      Full                                                        +---------+---------------+---------+-----------+----------+--------------+  POP      Full           Yes      Yes                                 +---------+---------------+---------+-----------+----------+--------------+ PTV      Full                                                        +---------+---------------+---------+-----------+----------+--------------+ PERO     Full                                                        +---------+---------------+---------+-----------+----------+--------------+   +----+---------------+---------+-----------+----------+--------------+ LEFTCompressibilityPhasicitySpontaneityPropertiesThrombus Aging +----+---------------+---------+-----------+----------+--------------+ CFV Full           Yes      Yes                                 +----+---------------+---------+-----------+----------+--------------+     Summary: RIGHT: - There is no evidence of deep vein thrombosis in the lower extremity.  - No cystic structure found in the popliteal fossa.  LEFT: - No evidence of common femoral vein obstruction.   *See table(s) above for measurements and observations.    Preliminary    IR GASTROSTOMY TUBE MOD SED Result Date: 05/07/2023 INDICATION: Chemo/radiation for head and neck cancer EXAM: Procedures: 1. FLUOROSCOPIC NASOGASTRIC TUBE PLACEMENT 2. PERCUTANEOUS GASTROSTOMY FEEDING TUBE PLACEMENT COMPARISON:  PET-CT, 04/15/2023. MEDICATIONS: Ancef 2 gm IV; Antibiotics were administered within 1 hour of the procedure. 25 mg Benadryl IV. CONTRAST:  20 mL of Isovue 300 administered into the gastric lumen. ANESTHESIA/SEDATION: Moderate (conscious) sedation  was employed during this procedure. A total of Versed 1 mg and Fentanyl 50 mcg was administered intravenously. Moderate Sedation Time: 20 minutes. The patient's level of consciousness and vital signs were monitored continuously by radiology nursing throughout the procedure under my direct supervision. FLUOROSCOPY TIME:  Fluoroscopic dose; 6.6 mGy COMPLICATIONS: None immediate. PROCEDURE: Informed written consent was obtained from the patient and/or patient's representative following explanation of the procedure, risks, benefits and alternatives. A time out was performed prior to the initiation of the procedure. Maximal barrier sterile technique utilized including caps, mask, sterile gowns, sterile gloves, large sterile drape, hand hygiene and sterile prep. The LEFT upper quadrant was sterilely prepped and draped. A oral gastric catheter was inserted into the stomach under fluoroscopy. The LEFT costal margin and barium opacified transverse colon were identified and avoided. Air was injected into the stomach for insufflation and visualization under fluoroscopy. Under sterile conditions and local anesthesia, 2 T tacks were utilized to pexy the anterior aspect of the stomach against the ventral abdominal wall. Contrast injection confirmed appropriate positioning of each of the T tacks. An incision was made between the T tacks and a 17 gauge trocar needle was utilized to access the stomach. Needle position was confirmed within the stomach with aspiration of air and injection of a small amount of contrast. A stiff guidewire was advanced into the gastric lumen and under intermittent  fluoroscopic guidance, the access needle was exchanged for a telescoping peel-away sheath, ultimately allowing placement of a 16 Fr balloon retention gastrostomy tube. The retention balloon was insufflated with a mixture of dilute saline and contrast and pulled taut against the anterior wall of the stomach. The external disc was cinched.  Contrast injection confirms positioning within the stomach. Several spot radiographic images were obtained in various obliquities for documentation. The patient tolerated procedure well without immediate post procedural complication. FINDINGS: After successful fluoroscopic guided placement, the gastrostomy tube is appropriately positioned with internal retention balloon against the ventral aspect of the gastric lumen. IMPRESSION: Successful fluoroscopic insertion of a 16 Fr balloon retention gastrostomy tube. The gastrostomy may be used immediately for medication administration and in 4 hrs for the initiation of feeds. RECOMMENDATIONS: The patient will return to Vascular Interventional Radiology (VIR) for routine feeding tube evaluation and exchange in 6 months. Roanna Banning, MD [facility] Radiology Electronically Signed   By: Roanna Banning M.D.   On: 05/07/2023 17:07   IR Naso G Tube Plc W/FL W/Rad Result Date: 05/07/2023 INDICATION: Chemo/radiation for head and neck cancer EXAM: Procedures: 1. FLUOROSCOPIC NASOGASTRIC TUBE PLACEMENT 2. PERCUTANEOUS GASTROSTOMY FEEDING TUBE PLACEMENT COMPARISON:  PET-CT, 04/15/2023. MEDICATIONS: Ancef 2 gm IV; Antibiotics were administered within 1 hour of the procedure. 25 mg Benadryl IV. CONTRAST:  20 mL of Isovue 300 administered into the gastric lumen. ANESTHESIA/SEDATION: Moderate (conscious) sedation was employed during this procedure. A total of Versed 1 mg and Fentanyl 50 mcg was administered intravenously. Moderate Sedation Time: 20 minutes. The patient's level of consciousness and vital signs were monitored continuously by radiology nursing throughout the procedure under my direct supervision. FLUOROSCOPY TIME:  Fluoroscopic dose; 6.6 mGy COMPLICATIONS: None immediate. PROCEDURE: Informed written consent was obtained from the patient and/or patient's representative following explanation of the procedure, risks, benefits  and alternatives. A time out was performed prior to the initiation of the procedure. Maximal barrier sterile technique utilized including caps, mask, sterile gowns, sterile gloves, large sterile drape, hand hygiene and sterile prep. The LEFT upper quadrant was sterilely prepped and draped. A oral gastric catheter was inserted into the stomach under fluoroscopy. The LEFT costal margin and barium opacified transverse colon were identified and avoided. Air was injected into the stomach for insufflation and visualization under fluoroscopy. Under sterile conditions and local anesthesia, 2 T tacks were utilized to pexy the anterior aspect of the stomach against the ventral abdominal wall. Contrast injection confirmed appropriate positioning of each of the T tacks. An incision was made between the T tacks and a 17 gauge trocar needle was utilized to access the stomach. Needle position was confirmed within the stomach with aspiration of air and injection of a small amount of contrast. A stiff guidewire was advanced into the gastric lumen and under intermittent fluoroscopic guidance, the access needle was exchanged for a telescoping peel-away sheath, ultimately allowing placement of a 16 Fr balloon retention gastrostomy tube. The retention balloon was insufflated with a mixture of dilute saline and contrast and pulled taut against the anterior wall of the stomach. The external disc was cinched. Contrast injection confirms positioning within the stomach. Several spot radiographic images were obtained in various obliquities for documentation. The patient tolerated procedure well without immediate post procedural complication. FINDINGS: After successful fluoroscopic guided placement, the gastrostomy tube is appropriately positioned with internal retention balloon against the ventral aspect of the gastric lumen. IMPRESSION: Successful fluoroscopic insertion of a 16 Fr balloon retention  gastrostomy tube. The gastrostomy may be  used immediately for medication administration and in 4 hrs for the initiation of feeds. RECOMMENDATIONS: The patient will return to Vascular Interventional Radiology (VIR) for routine feeding tube evaluation and exchange in 6 months. Roanna Banning, MD [facility] Radiology Electronically Signed   By: Roanna Banning M.D.   On: 05/07/2023 17:07   IR IMAGING GUIDED PORT INSERTION Result Date: 05/07/2023 INDICATION: Chemotherapy for head and neck cancer EXAM: IMPLANTED PORT A CATH PLACEMENT WITH ULTRASOUND AND FLUOROSCOPIC GUIDANCE MEDICATIONS: 25 mg Benadryl IV ANESTHESIA/SEDATION: Moderate (conscious) sedation was employed during this procedure. A total of Versed 1 mg and Fentanyl 25 mcg was administered intravenously. Moderate Sedation Time: 24 minutes. The patient's level of consciousness and vital signs were monitored continuously by radiology nursing throughout the procedure under my direct supervision. FLUOROSCOPY TIME:  Fluoroscopic dose; 2.5 mGy COMPLICATIONS: None immediate. PROCEDURE: The procedure, risks, benefits, and alternatives were explained to the patient. Questions regarding the procedure were encouraged and answered. The patient understands and consents to the procedure. The RIGHT neck and chest were prepped with chlorhexidine in a sterile fashion, and a sterile drape was applied covering the operative field. Maximum barrier sterile technique with sterile gowns and gloves were used for the procedure. A timeout was performed prior to the initiation of the procedure. Local anesthesia was provided with 1% lidocaine with epinephrine. After creating a small venotomy incision, a micropuncture kit was utilized to access the internal jugular vein under direct, real-time ultrasound guidance. Ultrasound image documentation was performed. The microwire was kinked to measure appropriate catheter length. A subcutaneous port pocket was then created along the upper  chest wall utilizing a combination of sharp and blunt dissection. The pocket was irrigated with sterile saline. A single lumen power injectable port was chosen for placement. The 8 Fr catheter was tunneled from the port pocket site to the venotomy incision. The port was placed in the pocket. The external catheter was trimmed to appropriate length. At the venotomy, an 8 Fr peel-away sheath was placed over a guidewire under fluoroscopic guidance. The catheter was then placed through the sheath and the sheath was removed. Final catheter positioning was confirmed and documented with a fluoroscopic spot radiograph. The port was accessed with a Huber needle, aspirated and flushed with heparinized saline. The port pocket incision was closed with interrupted 3-0 Vicryl suture then Dermabond was applied, including at the venotomy incision. Dressings were placed. The patient tolerated the procedure well without immediate post procedural complication. IMPRESSION: Successful placement of a RIGHT internal jugular approach power injectable Port-A-Cath. The tip of the catheter is positioned within the proximal RIGHT atrium. The catheter is ready for immediate use. Roanna Banning, MD [facility] Radiology Electronically Signed   By: Roanna Banning M.D.   On: 05/07/2023 17:02    CODE STATUS:  Code Status History     Date Active Date Inactive Code Status Order ID Comments User Context   05/07/2023 1642 05/08/2023 0512 Full Code [id number]  Roanna Banning, MD [facility]   05/07/2023 1642 05/07/2023 1642 Full Code [id number]  Roanna Banning, MD [facility]    Questions for Most Recent Historical Code Status (Order [id number])     Question Answer   By: Consent: discussion documented in EHR            Orders Placed This Encounter  Procedures   Uric acid    Standing Status:   Future    Expected  Date:   05/28/2023    Expiration Date:   05/27/2024     Future Appointments  Date Time Provider  Department Center  05/21/2023  7:30 AM [facility] LINAC 4 [facility] None  05/21/2023  8:00 AM [facility] INFUSION [facility] None  05/21/2023  9:45 AM Noreene Larsson, RD [facility] None  05/24/2023  4:00 PM [facility] LINAC 4 [facility] None  05/24/2023  4:20 PM LINAC-SQUIRE [facility] None  05/25/2023  4:15 PM [facility] [id number] [facility] None  05/26/2023  8:15 AM [facility] [id number] [facility] None  05/26/2023  9:00 AM [facility] FLUSH [facility] None  05/26/2023  9:40 AM Tamura Lasky, MD [facility] None  05/27/2023  8:45 AM [facility] LINAC 3 [facility] None  05/28/2023  8:15 AM [facility] [id number] [facility] None  05/28/2023  9:00 AM [facility] INFUSION [facility] None  05/28/2023  9:45 AM Noreene Larsson, RD [facility] None  05/28/2023 10:30 AM Marguerita Merles, LCSW [facility] None  05/31/2023  4:15 PM [facility] [id number] [facility] None  06/01/2023  4:15 PM [facility] [id number] [facility] None  06/02/2023  8:15 AM [facility] [id number] [facility] None  06/02/2023  9:45 AM [facility] FLUSH [facility] None  06/02/2023 10:20 AM Jaskirat Zertuche, MD [facility] None  06/03/2023  4:15 PM [facility] [id number] [facility] None  06/04/2023  8:15 AM [facility] [id number] [facility] None  06/04/2023  9:00 AM [facility] INFUSION [facility] None  06/04/2023 10:30 AM Noreene Larsson, RD [facility] None  06/07/2023  4:15 PM [facility] [id number] [facility] None  06/08/2023  4:15 PM [facility] [id number] [facility] None  06/09/2023  4:15 PM [facility] [id number] [facility] None  06/10/2023 10:45 AM [facility] [id number] [facility] None  06/10/2023 11:15 AM [facility] FLUSH [facility] None  06/10/2023 11:40 AM Doriann Zuch, MD [facility] None  06/11/2023  8:15 AM [facility] LINAC 3 [facility] None  06/11/2023  9:00 AM [facility] INFUSION [facility] None  06/11/2023  9:45 AM Noreene Larsson, RD [facility] None  06/14/2023  4:15 PM [facility] [id number] [facility] None   06/15/2023  4:15 PM [facility] [id number] [facility] None  06/16/2023  4:15 PM [facility] [id number] [facility] None  06/17/2023  2:45 PM [facility] FLUSH [facility] None  06/17/2023  3:20 PM Kato Wieczorek, MD [facility] None  06/17/2023  4:15 PM [facility] [id number] [facility] None  06/18/2023 10:00 AM [facility] INFUSION [facility] None  06/18/2023  4:15 PM [facility] [id number] [facility] None  06/21/2023  4:15 PM [facility] [id number] [facility] None  06/22/2023  4:00 PM [facility] LINAC 4 [facility] None  06/23/2023  4:15 PM [facility] [id number] [facility] None  06/24/2023  3:15 PM [facility] FLUSH [facility] None  06/24/2023  3:40 PM Sidney Silberman, MD [facility] None  06/24/2023  4:15 PM [facility] [id number] [facility] None  06/25/2023 10:00 AM [facility] INFUSION [facility] None  06/25/2023  4:15 PM [facility] [id number] [facility] None      This document was completed utilizing speech recognition software. Grammatical errors, random word insertions, pronoun errors, and incomplete sentences are an occasional consequence of this system due to software limitations, ambient noise, and hardware issues. Any formal questions or concerns about the content, text or information contained within the body of this dictation should be directly addressed to the provider for clarification.

## 2023-05-20 NOTE — Progress Notes (Signed)
Lower extremity venous duplex completed. Please see CV Procedures for preliminary results.  Shona Simpson, RVT 05/20/23 2:41 PM

## 2023-05-21 ENCOUNTER — Inpatient Hospital Stay: Payer: No Typology Code available for payment source | Admitting: Dietician

## 2023-05-21 ENCOUNTER — Other Ambulatory Visit: Payer: Self-pay

## 2023-05-21 ENCOUNTER — Inpatient Hospital Stay: Payer: No Typology Code available for payment source

## 2023-05-21 ENCOUNTER — Ambulatory Visit
Admission: RE | Admit: 2023-05-21 | Discharge: 2023-05-21 | Disposition: A | Discharge status: Home / Self Care | Payer: No Typology Code available for payment source | Source: Ambulatory Visit | Attending: Radiation Oncology | Admitting: Radiation Oncology

## 2023-05-21 VITALS — BP 133/79 | HR 60 | Temp 98.2°F | Resp 18 | Wt 206.8 lb

## 2023-05-21 DIAGNOSIS — C024 Malignant neoplasm of lingual tonsil: Secondary | ICD-10-CM

## 2023-05-21 DIAGNOSIS — Z5111 Encounter for antineoplastic chemotherapy: Secondary | ICD-10-CM | POA: Diagnosis not present

## 2023-05-21 LAB — RAD ONC ARIA SESSION SUMMARY
Course Elapsed Days: 11
Plan Fractions Treated to Date: 10
Plan Prescribed Dose Per Fraction: 2 Gy
Plan Total Fractions Prescribed: 35
Plan Total Prescribed Dose: 70 Gy
Reference Point Dosage Given to Date: 20 Gy
Reference Point Session Dosage Given: 2 Gy
Session Number: 10

## 2023-05-21 MED ORDER — POTASSIUM CHLORIDE IN NACL 20-0.9 MEQ/L-% IV SOLN
Freq: Once | INTRAVENOUS | Status: AC
Start: 2023-05-21 — End: 2023-05-21
  Filled 2023-05-21: qty 1000

## 2023-05-21 MED ORDER — SODIUM CHLORIDE 0.9 % IV SOLN
150.0000 mg | Freq: Once | INTRAVENOUS | Status: AC
  Administered 2023-05-21: 150 mg via INTRAVENOUS
  Filled 2023-05-21: qty 150

## 2023-05-21 MED ORDER — SODIUM CHLORIDE 0.9% FLUSH: 10.0000 mL | INTRAVENOUS | Status: DC | PRN

## 2023-05-21 MED ORDER — DEXAMETHASONE SODIUM PHOSPHATE 10 MG/ML IJ SOLN
10.0000 mg | Freq: Once | INTRAMUSCULAR | Status: AC
  Administered 2023-05-21: 10 mg via INTRAVENOUS
  Filled 2023-05-21: qty 1

## 2023-05-21 MED ORDER — SODIUM CHLORIDE 0.9 % IV SOLN
INTRAVENOUS | Status: DC
Start: 2023-05-21 — End: 2023-05-21

## 2023-05-21 MED ORDER — MAGNESIUM SULFATE 2 GM/50ML IV SOLN
2.0000 g | Freq: Once | INTRAVENOUS | Status: AC
Start: 2023-05-21 — End: 2023-05-21
  Administered 2023-05-21: 2 g via INTRAVENOUS
  Filled 2023-05-21: qty 50

## 2023-05-21 MED ORDER — PALONOSETRON HCL INJECTION 0.25 MG/5ML
0.2500 mg | Freq: Once | INTRAVENOUS | Status: AC
  Administered 2023-05-21: 0.25 mg via INTRAVENOUS
  Filled 2023-05-21: qty 5

## 2023-05-21 MED ORDER — SODIUM CHLORIDE 0.9 % IV SOLN
40.0000 mg/m2 | Freq: Once | INTRAVENOUS | Status: AC
  Administered 2023-05-21: 88 mg via INTRAVENOUS
  Filled 2023-05-21: qty 88

## 2023-05-21 MED ORDER — HEPARIN SOD (PORK) LOCK FLUSH 100 UNIT/ML IV SOLN: 500.0000 [IU] | Freq: Once | INTRAVENOUS | Status: DC | PRN

## 2023-05-21 NOTE — Patient Instructions (Signed)
[facility] - A DEPT OF [facility]  Discharge Instructions: Thank you for choosing [facility] to provide your oncology and hematology care.   If you have a lab appointment with the Cancer Center, please go directly to the Cancer Center and check in at the registration area.   Wear comfortable clothing and clothing appropriate for easy access to any Portacath or PICC line.   We strive to give you quality time with your provider. You may need to reschedule your appointment if you arrive late (15 or more minutes).  Arriving late affects you and other patients whose appointments are after yours.  Also, if you miss three or more appointments without notifying the office, you may be dismissed from the clinic at the provider's discretion.      For prescription refill requests, have your pharmacy contact our office and allow 72 hours for refills to be completed.    Today you received the following chemotherapy and/or immunotherapy agents: Cisplatin      To help prevent nausea and vomiting after your treatment, we encourage you to take your nausea medication as directed.  BELOW ARE SYMPTOMS THAT SHOULD BE REPORTED IMMEDIATELY: *FEVER GREATER THAN 100.4 F (38 C) OR HIGHER *CHILLS OR SWEATING *NAUSEA AND VOMITING THAT IS NOT CONTROLLED WITH YOUR NAUSEA MEDICATION *UNUSUAL SHORTNESS OF BREATH *UNUSUAL BRUISING OR BLEEDING *URINARY PROBLEMS (pain or burning when urinating, or frequent urination) *BOWEL PROBLEMS (unusual diarrhea, constipation, pain near the anus) TENDERNESS IN MOUTH AND THROAT WITH OR WITHOUT PRESENCE OF ULCERS (sore throat, sores in mouth, or a toothache) UNUSUAL RASH, SWELLING OR PAIN  UNUSUAL VAGINAL DISCHARGE OR ITCHING   Items with * indicate a potential emergency and should be followed up as soon as possible or go to the Emergency Department if any problems should occur.  Please show the CHEMOTHERAPY ALERT CARD or IMMUNOTHERAPY  ALERT CARD at check-in to the Emergency Department and triage nurse.  Should you have questions after your visit or need to cancel or reschedule your appointment, please contact [facility] - A DEPT OF [facility]  Dept: [phone]  and follow the prompts.  Office hours are 8:00 a.m. to 4:30 p.m. Monday - Friday. Please note that voicemails left after 4:00 p.m. may not be returned until the following business day.  We are closed weekends and major holidays. You have access to a nurse at all times for urgent questions. Please call the main number to the clinic Dept: [phone] and follow the prompts.   For any non-urgent questions, you may also contact your provider using MyChart. We now offer e-Visits for anyone 65 and older to request care online for non-urgent symptoms. For details visit [email or web address].   Also download the MyChart app! Go to the app store, search "MyChart", open the app, select [facility], and log in with your MyChart username and password.  Cisplatin Injection What is this medication? CISPLATIN (SIS pla tin) treats some types of cancer. It works by slowing down the growth of cancer cells. This medicine may be used for other purposes; ask your health care provider or pharmacist if you have questions. COMMON BRAND NAME(S): Platinol, Platinol -AQ What should I tell my care team before I take this medication? They need to know if you have any of these conditions: Eye disease, vision problems Hearing problems Kidney disease Low blood counts, such as low white cells, platelets, or red  blood cells Tingling of the fingers or toes, or other nerve disorder An unusual or allergic reaction to cisplatin, carboplatin, oxaliplatin, other medications, foods, dyes, or preservatives If you or your partner are pregnant or trying to get pregnant Breast-feeding How should I use this medication? This medication is injected into a vein. It is given  by your care team in a hospital or clinic setting. Talk to your care team about the use of this medication in children. Special care may be needed. Overdosage: If you think you have taken too much of this medicine contact a poison control center or emergency room at once. NOTE: This medicine is only for you. Do not share this medicine with others. What if I miss a dose? Keep appointments for follow-up doses. It is important not to miss your dose. Call your care team if you are unable to keep an appointment. What may interact with this medication? Do not take this medication with any of the following: Live virus vaccines This medication may also interact with the following: Certain antibiotics, such as amikacin, gentamicin, neomycin, polymyxin B, streptomycin, tobramycin, vancomycin Foscarnet This list may not describe all possible interactions. Give your health care provider a list of all the medicines, herbs, non-prescription drugs, or dietary supplements you use. Also tell them if you smoke, drink alcohol, or use illegal drugs. Some items may interact with your medicine. What should I watch for while using this medication? Your condition will be monitored carefully while you are receiving this medication. You may need blood work done while taking this medication. This medication may make you feel generally unwell. This is not uncommon, as chemotherapy can affect healthy cells as well as cancer cells. Report any side effects. Continue your course of treatment even though you feel ill unless your care team tells you to stop. This medication may increase your risk of getting an infection. Call your care team for advice if you get a fever, chills, sore throat, or other symptoms of a cold or flu. Do not treat yourself. Try to avoid being around people who are sick. Avoid taking medications that contain aspirin, acetaminophen, ibuprofen, naproxen, or ketoprofen unless instructed by your care team. These  medications may hide a fever. This medication may increase your risk to bruise or bleed. Call your care team if you notice any unusual bleeding. Be careful brushing or flossing your teeth or using a toothpick because you may get an infection or bleed more easily. If you have any dental work done, tell your dentist you are receiving this medication. Drink fluids as directed while you are taking this medication. This will help protect your kidneys. Call your care team if you get diarrhea. Do not treat yourself. Talk to your care team if you or your partner wish to become pregnant or think you might be pregnant. This medication can cause serious birth defects if taken during pregnancy and for 14 months after the last dose. A negative pregnancy test is required before starting this medication. A reliable form of contraception is recommended while taking this medication and for 14 months after the last dose. Talk to your care team about effective forms of contraception. Do not father a child while taking this medication and for 11 months after the last dose. Use a condom during sex during this time period. Do not breast-feed while taking this medication. This medication may cause infertility. Talk to your care team if you are concerned about your fertility. What side effects may I  notice from receiving this medication? Side effects that you should report to your care team as soon as possible: Allergic reactions--skin rash, itching, hives, swelling of the face, lips, tongue, or throat Eye pain, change in vision, vision loss Hearing loss, ringing in ears Infection--fever, chills, cough, sore throat, wounds that don't heal, pain or trouble when passing urine, general feeling of discomfort or being unwell Kidney injury--decrease in the amount of urine, swelling of the ankles, hands, or feet Low red blood cell level--unusual weakness or fatigue, dizziness, headache, trouble breathing Painful swelling, warmth,  or redness of the skin, blisters or sores at the infusion site Pain, tingling, or numbness in the hands or feet Unusual bruising or bleeding Side effects that usually do not require medical attention (report to your care team if they continue or are bothersome): Hair loss Nausea Vomiting This list may not describe all possible side effects. Call your doctor for medical advice about side effects. You may report side effects to FDA at 1-800-FDA-1088. Where should I keep my medication? This medication is given in a hospital or clinic. It will not be stored at home. NOTE: This sheet is a summary. It may not cover all possible information. If you have questions about this medicine, talk to your doctor, pharmacist, or health care provider.  2024 Elsevier/Gold Standard (2021-07-25 00:00:00)

## 2023-05-21 NOTE — Progress Notes (Signed)
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 He is receiving concurrent chemoradiation (RT start 2/3, chemo 2/7). Patient is under the care of Dr. Basilio Cairo and Dr. Arlana Pouch.   S/p PEG 1/31  Met with patient in infusion. He reports doing well other than his foot. He has not picked up antibiotics yet for this. Patient reports 6/10 pain after taking tramadol. Stated he almost cancelled treatment today due to 10/10 pain this morning. Patient has been eating and drinking well. Wife reports decreased po in the last couple of days secondary to right foot pain. Patient had hiccups today. States he has been getting these since treatment. This is worse at night. He is flushing tube 2x/day. Patient denies nutrition impact symptoms at this time.   Medications: bactrim, tramadol  Labs: reviewed   Anthropometrics: Wt 206 lb 12 oz today - stable  2/7 - 206 lb 8 oz    Estimated Energy Needs  Kcals: 2700-3000 Protein: 122-140 Fluid: >2.7 L  NUTRITION DIAGNOSIS: Unintended wt loss - stable    INTERVENTION:  Encouraged elevating feet as able. Pt will pick up antibiotic on the way home today Continue regular diet at this time Encourage pt to try ONS samples - recommend supplementing if oral intake decreases Message to Dr. Arlana Pouch regarding new onset hiccups     MONITORING, EVALUATION, GOAL: wt trends, intake   NEXT VISIT: Friday February 21 during infusion

## 2023-05-24 ENCOUNTER — Ambulatory Visit
Admission: RE | Admit: 2023-05-24 | Discharge: 2023-05-24 | Disposition: A | Discharge status: Home / Self Care | Payer: No Typology Code available for payment source | Source: Ambulatory Visit | Attending: Radiation Oncology | Admitting: Radiation Oncology

## 2023-05-24 ENCOUNTER — Other Ambulatory Visit: Payer: Self-pay

## 2023-05-24 DIAGNOSIS — Z5111 Encounter for antineoplastic chemotherapy: Secondary | ICD-10-CM | POA: Diagnosis not present

## 2023-05-24 LAB — RAD ONC ARIA SESSION SUMMARY
Course Elapsed Days: 14
Plan Fractions Treated to Date: 11
Plan Prescribed Dose Per Fraction: 2 Gy
Plan Total Fractions Prescribed: 35
Plan Total Prescribed Dose: 70 Gy
Reference Point Dosage Given to Date: 22 Gy
Reference Point Session Dosage Given: 2 Gy
Session Number: 11

## 2023-05-25 ENCOUNTER — Other Ambulatory Visit: Payer: Self-pay

## 2023-05-25 ENCOUNTER — Ambulatory Visit
Admission: RE | Admit: 2023-05-25 | Discharge: 2023-05-25 | Disposition: A | Discharge status: Home / Self Care | Payer: No Typology Code available for payment source | Source: Ambulatory Visit | Attending: Radiation Oncology | Admitting: Radiation Oncology

## 2023-05-25 DIAGNOSIS — Z5111 Encounter for antineoplastic chemotherapy: Secondary | ICD-10-CM | POA: Diagnosis not present

## 2023-05-25 LAB — RAD ONC ARIA SESSION SUMMARY
Course Elapsed Days: 15
Plan Fractions Treated to Date: 12
Plan Prescribed Dose Per Fraction: 2 Gy
Plan Total Fractions Prescribed: 35
Plan Total Prescribed Dose: 70 Gy
Reference Point Dosage Given to Date: 24 Gy
Reference Point Session Dosage Given: 2 Gy
Session Number: 12

## 2023-05-26 ENCOUNTER — Inpatient Hospital Stay: Payer: No Typology Code available for payment source

## 2023-05-26 ENCOUNTER — Inpatient Hospital Stay (HOSPITAL_BASED_OUTPATIENT_CLINIC_OR_DEPARTMENT_OTHER): Payer: No Typology Code available for payment source | Admitting: Oncology

## 2023-05-26 ENCOUNTER — Encounter: Payer: Self-pay | Admitting: Oncology

## 2023-05-26 ENCOUNTER — Other Ambulatory Visit: Payer: No Typology Code available for payment source

## 2023-05-26 ENCOUNTER — Other Ambulatory Visit: Payer: Self-pay

## 2023-05-26 ENCOUNTER — Ambulatory Visit
Admission: RE | Admit: 2023-05-26 | Discharge: 2023-05-26 | Disposition: A | Discharge status: Home / Self Care | Payer: No Typology Code available for payment source | Source: Ambulatory Visit | Attending: Radiation Oncology

## 2023-05-26 VITALS — BP 118/68 | HR 67 | Temp 98.6°F | Resp 17 | Ht 72.0 in | Wt 240.0 lb

## 2023-05-26 DIAGNOSIS — C024 Malignant neoplasm of lingual tonsil: Secondary | ICD-10-CM | POA: Diagnosis not present

## 2023-05-26 DIAGNOSIS — L03115 Cellulitis of right lower limb: Secondary | ICD-10-CM | POA: Diagnosis not present

## 2023-05-26 DIAGNOSIS — Z95828 Presence of other vascular implants and grafts: Secondary | ICD-10-CM

## 2023-05-26 DIAGNOSIS — Z5111 Encounter for antineoplastic chemotherapy: Secondary | ICD-10-CM | POA: Diagnosis not present

## 2023-05-26 LAB — RAD ONC ARIA SESSION SUMMARY
Course Elapsed Days: 16
Plan Fractions Treated to Date: 13
Plan Prescribed Dose Per Fraction: 2 Gy
Plan Total Fractions Prescribed: 35
Plan Total Prescribed Dose: 70 Gy
Reference Point Dosage Given to Date: 26 Gy
Reference Point Session Dosage Given: 2 Gy
Session Number: 13

## 2023-05-26 LAB — CBC WITH DIFFERENTIAL (CANCER CENTER ONLY)
Abs Immature Granulocytes: 0.02 10*3/uL (ref 0.00–0.07)
Basophils Absolute: 0 10*3/uL (ref 0.0–0.1)
Basophils Relative: 1 %
Eosinophils Absolute: 0 10*3/uL (ref 0.0–0.5)
Eosinophils Relative: 0 %
HCT: 39.8 % (ref 39.0–52.0)
Hemoglobin: 13.4 g/dL (ref 13.0–17.0)
Immature Granulocytes: 0 %
Lymphocytes Relative: 8 %
Lymphs Abs: 0.5 10*3/uL — ABNORMAL LOW (ref 0.7–4.0)
MCH: 30.6 pg (ref 26.0–34.0)
MCHC: 33.7 g/dL (ref 30.0–36.0)
MCV: 90.9 fL (ref 80.0–100.0)
Monocytes Absolute: 0.6 10*3/uL (ref 0.1–1.0)
Monocytes Relative: 10 %
Neutro Abs: 4.9 10*3/uL (ref 1.7–7.7)
Neutrophils Relative %: 81 %
Platelet Count: 201 10*3/uL (ref 150–400)
RBC: 4.38 MIL/uL (ref 4.22–5.81)
RDW: 11.3 % — ABNORMAL LOW (ref 11.5–15.5)
WBC Count: 6.1 10*3/uL (ref 4.0–10.5)
nRBC: 0 % (ref 0.0–0.2)

## 2023-05-26 LAB — BASIC METABOLIC PANEL - CANCER CENTER ONLY
Anion gap: 5 (ref 5–15)
BUN: 17 mg/dL (ref 8–23)
CO2: 26 mmol/L (ref 22–32)
Calcium: 9.2 mg/dL (ref 8.9–10.3)
Chloride: 104 mmol/L (ref 98–111)
Creatinine: 0.84 mg/dL (ref 0.61–1.24)
GFR, Estimated: >60 mL/min (ref >60)
Glucose, Bld: 88 mg/dL (ref 70–99)
Potassium: 4.1 mmol/L (ref 3.5–5.1)
Sodium: 135 mmol/L (ref 135–145)

## 2023-05-26 LAB — MAGNESIUM: Magnesium: 1.7 mg/dL (ref 1.7–2.4)

## 2023-05-26 LAB — URIC ACID: Uric Acid, Serum: 6 mg/dL (ref 3.7–8.6)

## 2023-05-26 MED ORDER — SODIUM CHLORIDE 0.9% FLUSH
10.0000 mL | Freq: Once | INTRAVENOUS | Status: AC
Start: 2023-05-26 — End: 2023-05-26
  Administered 2023-05-26: 10 mL

## 2023-05-26 MED ORDER — HEPARIN SOD (PORK) LOCK FLUSH 100 UNIT/ML IV SOLN
500.0000 [IU] | Freq: Once | INTRAVENOUS | Status: AC
Start: 2023-05-26 — End: 2023-05-26
  Administered 2023-05-26: 500 [IU]

## 2023-05-26 NOTE — Progress Notes (Signed)
 [facility]  ONCOLOGY CLINIC PROGRESS NOTE   Patient Care Team: Caffie Damme, MD as PCP - General (Family Medicine) Lonie Peak, MD as Attending Physician (Radiation Oncology) Serena Colonel, MD as Consulting Physician (Otolaryngology) Malmfelt, Lise Auer, RN as Oncology Nurse Navigator Meryl Crutch, MD as Consulting Physician (Oncology)  PATIENT NAME: Randy Kaufman   MR#: [id number] DOB: 1960/09/22  Date of visit: 05/26/2023   ASSESSMENT & PLAN:   Randy Kaufman is a 63 y.o.  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 Unknown p16 status.   Lingual tonsil carcinoma (HCC) - Please review oncology history for additional details and timeline of events.  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 Unknown p16 status.  Clinically cT2,cN1,cM0.  There is concern for extracapsular extension based on imaging.  Discussed his case in tumor conference on 04/21/2023. Given the size and nature of the lymph node with concern for extracapsular extension, he has a high likelihood of requiring adjuvant chemoradiation if removed surgically.  Hence consensus opinion is to proceed with concurrent chemoradiation.  It was decided to not pursue another biopsy in an attempt to determine p16 status, since the treatment plan would not change.  -Previously I discussed tumor board recommendations with the patient.  He was agreeable to proceeding with concurrent chemoradiation. We have discussed about role of cisplatin being a radiosensitizer in the treatment of head and neck cancer.  We have discussed about the curative intent of chemoradiation for this patient.     Patient is willing to proceed with weekly cisplatin.   Cisplatin will be given at 40 mg/m IV dose weekly during the course of radiation.  He started radiation from 05/10/2023.  Started chemotherapy with cisplatin from 05/14/2023.  Has been tolerating treatments well without any major side effects.  -Labs today revealed no dose-limiting toxicities.  Will proceed with cycle 3 of cisplatin on 05/28/2023 as scheduled.  Patient has nausea medications available at home.  He has established with dietitian for supportive care.  Plan is to continue cisplatin weekly during the course of radiation.   Cellulitis of right foot Ultrasound of the right lower extremity on 05/20/2023 showed no evidence of DVT.   He did have signs of cellulitis in the right foot.  Started him on empiric antibiotic course with Bactrim DS tablet twice daily for 10 days.  Cellulitis is now resolved.  He was advised to complete Bactrim course.  - Advise alternating heat and ice application to the affected area - Advise elevating the foot and avoiding tight footwear    RTC in 1 week for labs, follow-up and continuation of chemotherapy.  I reviewed lab results and outside records for this visit and discussed relevant results with the patient. Diagnosis, plan of care and treatment options were also discussed in detail with the patient. Opportunity provided to ask questions and answers provided to his apparent satisfaction. Provided instructions to call our clinic with any problems, questions or concerns prior to return visit. I recommended to continue follow-up with PCP and sub-specialists. He verbalized understanding and agreed with the plan.   NCCN guidelines have been consulted in the planning of this patient's care.  I spent a total of 30 minutes during this encounter with the patient including review of chart and various  tests results, discussions about plan of care and coordination of care plan.   Meryl Crutch, MD  05/26/2023 12:37 PM  [facility]  [facility] - [facility] [address] [place] [address] Dept: [phone] Dept Fax: [phone]    CHIEF COMPLAINT/ REASON FOR VISIT:   Squamous cell carcinoma consistent with a right lingual tonsillar primary with a metastatic ipsilateral cervical node. Clinically cT2,cN1,cM0.   Current Treatment: Concurrent chemoradiation with weekly cisplatin started during the week of 05/10/2023.  INTERVAL HISTORY:    Discussed the use of AI scribe software for clinical note transcription with the patient, who gave verbal consent to proceed.   Randy Kaufman is here today for repeat clinical assessment.   He reports significant improvement in leg symptoms after a course of antibiotics. He has been managing any residual swelling with a regimen of ice and heat. He denies any recurrence of swelling since the initiation of the antibiotic course.  The patient also reports pain with swallowing, which has been ongoing for the last few days. Despite the discomfort, he is still able to eat and drink. He plans to supplement his diet with Ensure and other liquid nutrients. He has a feeding tube in place, which he is currently not using for nutrition but is maintaining with daily cleaning and flushing.  He reports tolerating the treatment well and appreciates the time-saving measure of receiving a flush and chemotherapy simultaneously.  I have reviewed the past medical history, past surgical history, social history and family history with the patient and they are unchanged from previous note.  HISTORY OF PRESENT ILLNESS:   ONCOLOGY HISTORY:   He presented to his dentist last year with c/o of a small lump to his right neck. He was subsequently referred to Dr. Pollyann Kennedy at [facility], on 04/02/23 for further evaluation. Physical exam performed at that time noted a palpable 4 cm right level 2 neck mass. No other masses were palpated.    A biopsy of the right neck  mass was collected that date (04/02/23) and showed findings consistent with metastatic squamous cell carcinoma.  There was insufficient tissue to check for p16 status.   Staging PET scan on 04/14/22 revealed hypermetabolic activity at the right lingual tonsil and right level II node. The enlarged lymph node appeared to measure approximately 2.6 cm in greatest dimension. No evidence of distant metastatic disease was seen.    He later had CT soft tissue neck at [facility].   Unknown p16 status.  Clinically cT2,cN1,cM0.  There is concern for extracapsular extension based on imaging.   Discussed his case in tumor conference on 04/21/2023. Given the size and nature of the lymph node with concern for extracapsular extension, he has a high likelihood of requiring adjuvant chemoradiation if removed surgically.  Hence consensus opinion was to proceed with concurrent chemoradiation. It was decided to not pursue another biopsy in an attempt to determine p16 status, since the treatment plan would not change.  He started radiation treatments from 05/10/2023.  Started chemotherapy with cisplatin from 05/14/2023.  Oncology History  Lingual tonsil carcinoma (HCC)  04/20/2023 Initial Diagnosis   Lingual tonsil carcinoma (HCC)   04/22/2023 Cancer Staging   Staging form: Pharynx - HPV-Mediated Oropharynx, AJCC 8th Edition - Clinical: Stage I (cT2, cN1, cM0, p16: Unknown, HPV: Unknown) - Signed by Meryl Crutch, MD on 04/22/2023   05/14/2023 -  Chemotherapy  Patient is on Treatment Plan : HEAD/NECK Cisplatin (40) q7d         REVIEW OF SYSTEMS:   Review of Systems - Oncology  All other pertinent systems were reviewed with the patient and are negative.  ALLERGIES: He has no known allergies.  MEDICATIONS:  Current Outpatient Medications  Medication Sig Dispense Refill   Biotin 1000 MCG tablet Take 1,000 mcg by mouth 3 (three) times daily as needed (Dry mouth).     dexamethasone (DECADRON) 4 MG  tablet Take 2 tablets (8 mg) by mouth daily x 3 days starting the day after cisplatin chemotherapy. Take with food. 30 tablet 1   finasteride (PROSCAR) 5 MG tablet Take 5 mg by mouth daily. Takes 1/2 Tab to equal 2.5 mg daily     lidocaine (XYLOCAINE) 2 % solution Patient: Mix 1part 2% viscous lidocaine, 1part H20. Swallow 10mL of diluted mixture, before meals and at bedtime, up to QID 200 mL 3   lidocaine-prilocaine (EMLA) cream Apply to affected area once 30 g 3   sulfamethoxazole-trimethoprim (BACTRIM DS) 800-160 MG tablet Take 1 tablet by mouth 2 (two) times daily. 20 tablet 0   ondansetron (ZOFRAN) 8 MG tablet Take 1 tablet (8 mg total) by mouth every 8 (eight) hours as needed for nausea or vomiting. Start on the third day after cisplatin. (Patient not taking: Reported on 05/26/2023) 30 tablet 1   prochlorperazine (COMPAZINE) 10 MG tablet Take 1 tablet (10 mg total) by mouth every 6 (six) hours as needed (Nausea or vomiting). (Patient not taking: Reported on 05/26/2023) 30 tablet 1   tadalafil (CIALIS) 20 MG tablet Take 20 mg by mouth daily as needed. (Patient not taking: Reported on 05/26/2023)     traMADol (ULTRAM) 50 MG tablet Take 1 tablet (50 mg total) by mouth every 6 (six) hours as needed. (Patient not taking: Reported on 05/26/2023) 60 tablet 0   No current facility-administered medications for this visit.     VITALS:   Blood pressure 118/68, pulse 67, temperature 98.6 F (37 C), temperature source Temporal, resp. rate 17, height 6' (1.829 m), weight 240 lb (108.9 kg), SpO2 100%.  Wt Readings from Last 3 Encounters:  05/26/23 240 lb (108.9 kg)  05/21/23 206 lb 12 oz (93.8 kg)  05/20/23 209 lb (94.8 kg)    Body mass index is 32.55 kg/m.    Onc Performance Status - 05/26/23 0945       ECOG Perf Status   ECOG Perf Status Fully active, able to carry on all pre-disease performance without restriction      KPS SCALE   KPS % SCORE Normal, no compliants, no evidence of  disease              PHYSICAL EXAM:   Physical Exam Constitutional:      General: He is not in acute distress.    Appearance: Normal appearance.  HENT:     Head: Normocephalic and atraumatic.     Mouth/Throat:     Comments: Slight erythema in the back of the throat Eyes:     General: No scleral icterus.    Conjunctiva/sclera: Conjunctivae normal.  Cardiovascular:     Rate and Rhythm: Normal rate and regular rhythm.     Pulses: Normal pulses.     Heart sounds: Normal heart sounds.  Pulmonary:     Effort: Pulmonary effort is normal.     Breath sounds: Normal breath sounds.  Chest:     Comments: Right-sided Port-A-Cath in  place, without any evidence of infection Abdominal:     General: There is no distension.     Comments: Feeding tube in place.  No signs of infection.  Musculoskeletal:     Right lower leg: No edema.     Left lower leg: No edema.  Lymphadenopathy:     Cervical: Cervical adenopathy (hard, fixed, right sided LN, improved compared to prior) present.  Skin:    Comments: Right foot cellulitis has resolved  Neurological:     General: No focal deficit present.     Mental Status: He is alert and oriented to person, place, and time.  Psychiatric:        Mood and Affect: Mood normal.        Behavior: Behavior normal.        Thought Content: Thought content normal.       LABORATORY DATA:   I have reviewed the data as listed.  Results for orders placed or performed in visit on 05/26/23  Uric acid  Result Value Ref Range   Uric Acid, Serum 6.0 3.7 - 8.6 mg/dL  CBC with Differential (Cancer Center Only)  Result Value Ref Range   WBC Count 6.1 4.0 - 10.5 K/uL   RBC 4.38 4.22 - 5.81 MIL/uL   Hemoglobin 13.4 13.0 - 17.0 g/dL   HCT 81.1 91.4 - 78.2 %   MCV 90.9 80.0 - 100.0 fL   MCH 30.6 26.0 - 34.0 pg   MCHC 33.7 30.0 - 36.0 g/dL   RDW 95.6 (L) 21.3 - 08.6 %   Platelet Count 201 150 - 400 K/uL   nRBC 0.0 0.0 - 0.2 %   Neutrophils Relative % 81 %    Neutro Abs 4.9 1.7 - 7.7 K/uL   Lymphocytes Relative 8 %   Lymphs Abs 0.5 (L) 0.7 - 4.0 K/uL   Monocytes Relative 10 %   Monocytes Absolute 0.6 0.1 - 1.0 K/uL   Eosinophils Relative 0 %   Eosinophils Absolute 0.0 0.0 - 0.5 K/uL   Basophils Relative 1 %   Basophils Absolute 0.0 0.0 - 0.1 K/uL   Immature Granulocytes 0 %   Abs Immature Granulocytes 0.02 0.00 - 0.07 K/uL  Basic Metabolic Panel - Cancer Center Only  Result Value Ref Range   Sodium 135 135 - 145 mmol/L   Potassium 4.1 3.5 - 5.1 mmol/L   Chloride 104 98 - 111 mmol/L   CO2 26 22 - 32 mmol/L   Glucose, Bld 88 70 - 99 mg/dL   BUN 17 8 - 23 mg/dL   Creatinine 5.78 4.69 - 1.24 mg/dL   Calcium 9.2 8.9 - 62.9 mg/dL   GFR, Estimated >52 >84 mL/min   Anion gap 5 5 - 15  Magnesium  Result Value Ref Range   Magnesium 1.7 1.7 - 2.4 mg/dL  Results for orders placed or performed in visit on 05/26/23  Rad Onc Aria Session Summary  Result Value Ref Range   Course ID [id number]    Course Start Date 04/30/2023    Session Number 13    Course First Treatment Date 05/10/2023  1:00 PM    Course Last Treatment Date 05/26/2023  8:38 AM    Course Elapsed Days 16    Reference Point ID HN dp    Reference Point Dosage Given to Date 26 Gy   Reference Point Session Dosage Given 2 Gy   Plan ID [id number]    Plan Fractions Treated to Date 13  Plan Total Fractions Prescribed 35    Plan Prescribed Dose Per Fraction 2 Gy   Plan Total Prescribed Dose 70.000000 Gy   Plan Primary Reference Point HN dp      RADIOGRAPHIC STUDIES:  I have personally reviewed the radiological images as listed and agree with the findings in the report.  VAS [place] LOWER EXTREMITY VENOUS (DVT) Result Date: 05/20/2023  Lower Venous DVT Study Patient Name:  ZAHI PLASKETT  Date of Exam:   05/20/2023 Medical Rec #: [id number]          Accession #:    [id number] Date of Birth: 1961/01/28           Patient Gender: M Patient Age:   28 years Exam Location:  [facility]  Procedure:      VAS [place] LOWER EXTREMITY VENOUS (DVT) Referring Phys: Archie Patten Laina Guerrieri --------------------------------------------------------------------------------  Indications: Pain.  Risk Factors: Cancer Carcinoma. Comparison Study: None. Performing Technologist: Shona Simpson  Examination Guidelines: A complete evaluation includes B-mode imaging, spectral Doppler, color Doppler, and power Doppler as needed of all accessible portions of each vessel. Bilateral testing is considered an integral part of a complete examination. Limited examinations for reoccurring indications may be performed as noted. The reflux portion of the exam is performed with the patient in reverse Trendelenburg.  +---------+---------------+---------+-----------+----------+--------------+ RIGHT    CompressibilityPhasicitySpontaneityPropertiesThrombus Aging +---------+---------------+---------+-----------+----------+--------------+ CFV      Full           Yes      Yes                                 +---------+---------------+---------+-----------+----------+--------------+ SFJ      Full                                                        +---------+---------------+---------+-----------+----------+--------------+ FV Prox  Full                                                        +---------+---------------+---------+-----------+----------+--------------+ FV Mid   Full                                                        +---------+---------------+---------+-----------+----------+--------------+ FV DistalFull                                                        +---------+---------------+---------+-----------+----------+--------------+ PFV      Full                                                        +---------+---------------+---------+-----------+----------+--------------+ POP  Full           Yes      Yes                                  +---------+---------------+---------+-----------+----------+--------------+ PTV      Full                                                        +---------+---------------+---------+-----------+----------+--------------+ PERO     Full                                                        +---------+---------------+---------+-----------+----------+--------------+   +----+---------------+---------+-----------+----------+--------------+ LEFTCompressibilityPhasicitySpontaneityPropertiesThrombus Aging +----+---------------+---------+-----------+----------+--------------+ CFV Full           Yes      Yes                                 +----+---------------+---------+-----------+----------+--------------+     Summary: RIGHT: - There is no evidence of deep vein thrombosis in the lower extremity.  - No cystic structure found in the popliteal fossa.  LEFT: - No evidence of common femoral vein obstruction.   *See table(s) above for measurements and observations. Electronically signed by Heath Lark on 05/20/2023 at 4:07:29 PM.    Final    IR GASTROSTOMY TUBE MOD SED Result Date: 05/07/2023 INDICATION: Chemo/radiation for head and neck cancer EXAM: Procedures: 1. FLUOROSCOPIC NASOGASTRIC TUBE PLACEMENT 2. PERCUTANEOUS GASTROSTOMY FEEDING TUBE PLACEMENT COMPARISON:  PET-CT, 04/15/2023. MEDICATIONS: Ancef 2 gm IV; Antibiotics were administered within 1 hour of the procedure. 25 mg Benadryl IV. CONTRAST:  20 mL of Isovue 300 administered into the gastric lumen. ANESTHESIA/SEDATION: Moderate (conscious) sedation was employed during this procedure. A total of Versed 1 mg and Fentanyl 50 mcg was administered intravenously. Moderate Sedation Time: 20 minutes. The patient's level of consciousness and vital signs were monitored continuously by radiology nursing throughout the procedure under my direct supervision. FLUOROSCOPY TIME:  Fluoroscopic dose; 6.6 mGy COMPLICATIONS: None immediate. PROCEDURE:  Informed written consent was obtained from the patient and/or patient's representative following explanation of the procedure, risks, benefits and alternatives. A time out was performed prior to the initiation of the procedure. Maximal barrier sterile technique utilized including caps, mask, sterile gowns, sterile gloves, large sterile drape, hand hygiene and sterile prep. The LEFT upper quadrant was sterilely prepped and draped. A oral gastric catheter was inserted into the stomach under fluoroscopy. The LEFT costal margin and barium opacified transverse colon were identified and avoided. Air was injected into the stomach for insufflation and visualization under fluoroscopy. Under sterile conditions and local anesthesia, 2 T tacks were utilized to pexy the anterior aspect of the stomach against the ventral abdominal wall. Contrast injection confirmed appropriate positioning of each of the T tacks. An incision was made between the T tacks and a 17 gauge trocar needle was utilized to access the stomach. Needle position was confirmed within the stomach with aspiration of air and injection of a small amount of contrast. A stiff guidewire was advanced into the  gastric lumen and under intermittent fluoroscopic guidance, the access needle was exchanged for a telescoping peel-away sheath, ultimately allowing placement of a 16 Fr balloon retention gastrostomy tube. The retention balloon was insufflated with a mixture of dilute saline and contrast and pulled taut against the anterior wall of the stomach. The external disc was cinched. Contrast injection confirms positioning within the stomach. Several spot radiographic images were obtained in various obliquities for documentation. The patient tolerated procedure well without immediate post procedural complication. FINDINGS: After successful fluoroscopic guided placement, the gastrostomy tube is appropriately positioned with internal retention balloon against the ventral  aspect of the gastric lumen. IMPRESSION: Successful fluoroscopic insertion of a 16 Fr balloon retention gastrostomy tube. The gastrostomy may be used immediately for medication administration and in 4 hrs for the initiation of feeds. RECOMMENDATIONS: The patient will return to Vascular Interventional Radiology (VIR) for routine feeding tube evaluation and exchange in 6 months. Roanna Banning, MD [facility] Electronically Signed   By: Roanna Banning M.D.   On: 05/07/2023 17:07   IR Naso G Tube Plc W/FL W/Rad Result Date: 05/07/2023 INDICATION: Chemo/radiation for head and neck cancer EXAM: Procedures: 1. FLUOROSCOPIC NASOGASTRIC TUBE PLACEMENT 2. PERCUTANEOUS GASTROSTOMY FEEDING TUBE PLACEMENT COMPARISON:  PET-CT, 04/15/2023. MEDICATIONS: Ancef 2 gm IV; Antibiotics were administered within 1 hour of the procedure. 25 mg Benadryl IV. CONTRAST:  20 mL of Isovue 300 administered into the gastric lumen. ANESTHESIA/SEDATION: Moderate (conscious) sedation was employed during this procedure. A total of Versed 1 mg and Fentanyl 50 mcg was administered intravenously. Moderate Sedation Time: 20 minutes. The patient's level of consciousness and vital signs were monitored continuously by radiology nursing throughout the procedure under my direct supervision. FLUOROSCOPY TIME:  Fluoroscopic dose; 6.6 mGy COMPLICATIONS: None immediate. PROCEDURE: Informed written consent was obtained from the patient and/or patient's representative following explanation of the procedure, risks, benefits and alternatives. A time out was performed prior to the initiation of the procedure. Maximal barrier sterile technique utilized including caps, mask, sterile gowns, sterile gloves, large sterile drape, hand hygiene and sterile prep. The LEFT upper quadrant was sterilely prepped and draped. A oral gastric catheter was inserted into the stomach under fluoroscopy. The LEFT costal margin and  barium opacified transverse colon were identified and avoided. Air was injected into the stomach for insufflation and visualization under fluoroscopy. Under sterile conditions and local anesthesia, 2 T tacks were utilized to pexy the anterior aspect of the stomach against the ventral abdominal wall. Contrast injection confirmed appropriate positioning of each of the T tacks. An incision was made between the T tacks and a 17 gauge trocar needle was utilized to access the stomach. Needle position was confirmed within the stomach with aspiration of air and injection of a small amount of contrast. A stiff guidewire was advanced into the gastric lumen and under intermittent fluoroscopic guidance, the access needle was exchanged for a telescoping peel-away sheath, ultimately allowing placement of a 16 Fr balloon retention gastrostomy tube. The retention balloon was insufflated with a mixture of dilute saline and contrast and pulled taut against the anterior wall of the stomach. The external disc was cinched. Contrast injection confirms positioning within the stomach. Several spot radiographic images were obtained in various obliquities for documentation. The patient tolerated procedure well without immediate post procedural complication. FINDINGS: After successful fluoroscopic guided placement, the gastrostomy tube is appropriately positioned with internal retention balloon against the ventral aspect of the gastric lumen. IMPRESSION: Successful fluoroscopic insertion of  a 16 Fr balloon retention gastrostomy tube. The gastrostomy may be used immediately for medication administration and in 4 hrs for the initiation of feeds. RECOMMENDATIONS: The patient will return to Vascular Interventional Radiology (VIR) for routine feeding tube evaluation and exchange in 6 months. Roanna Banning, MD [facility] Radiology Electronically Signed   By: Roanna Banning M.D.   On: 05/07/2023 17:07    IR IMAGING GUIDED PORT INSERTION Result Date: 05/07/2023 INDICATION: Chemotherapy for head and neck cancer EXAM: IMPLANTED PORT A CATH PLACEMENT WITH ULTRASOUND AND FLUOROSCOPIC GUIDANCE MEDICATIONS: 25 mg Benadryl IV ANESTHESIA/SEDATION: Moderate (conscious) sedation was employed during this procedure. A total of Versed 1 mg and Fentanyl 25 mcg was administered intravenously. Moderate Sedation Time: 24 minutes. The patient's level of consciousness and vital signs were monitored continuously by radiology nursing throughout the procedure under my direct supervision. FLUOROSCOPY TIME:  Fluoroscopic dose; 2.5 mGy COMPLICATIONS: None immediate. PROCEDURE: The procedure, risks, benefits, and alternatives were explained to the patient. Questions regarding the procedure were encouraged and answered. The patient understands and consents to the procedure. The RIGHT neck and chest were prepped with chlorhexidine in a sterile fashion, and a sterile drape was applied covering the operative field. Maximum barrier sterile technique with sterile gowns and gloves were used for the procedure. A timeout was performed prior to the initiation of the procedure. Local anesthesia was provided with 1% lidocaine with epinephrine. After creating a small venotomy incision, a micropuncture kit was utilized to access the internal jugular vein under direct, real-time ultrasound guidance. Ultrasound image documentation was performed. The microwire was kinked to measure appropriate catheter length. A subcutaneous port pocket was then created along the upper chest wall utilizing a combination of sharp and blunt dissection. The pocket was irrigated with sterile saline. A single lumen power injectable port was chosen for placement. The 8 Fr catheter was tunneled from the port pocket site to the venotomy incision. The port was placed in the pocket. The external catheter was trimmed to appropriate length. At the venotomy, an 8 Fr peel-away sheath  was placed over a guidewire under fluoroscopic guidance. The catheter was then placed through the sheath and the sheath was removed. Final catheter positioning was confirmed and documented with a fluoroscopic spot radiograph. The port was accessed with a Huber needle, aspirated and flushed with heparinized saline. The port pocket incision was closed with interrupted 3-0 Vicryl suture then Dermabond was applied, including at the venotomy incision. Dressings were placed. The patient tolerated the procedure well without immediate post procedural complication. IMPRESSION: Successful placement of a RIGHT internal jugular approach power injectable Port-A-Cath. The tip of the catheter is positioned within the proximal RIGHT atrium. The catheter is ready for immediate use. Roanna Banning, MD [facility] Radiology Electronically Signed   By: Roanna Banning M.D.   On: 05/07/2023 17:02    CODE STATUS:  Code Status History     Date Active Date Inactive Code Status Order ID Comments User Context   05/07/2023 1642 05/08/2023 0512 Full Code [id number]  Roanna Banning, MD [facility]   05/07/2023 1642 05/07/2023 1642 Full Code [id number]  Roanna Banning, MD [facility]    Questions for Most Recent Historical Code Status (Order [id number])     Question Answer   By: Consent: discussion documented in EHR            No orders of the defined types were placed in this encounter.    Future Appointments  Date Time Provider Department Center  05/27/2023 12:45 PM [facility] LINAC 3 [facility] None  05/27/2023  1:00 PM Nona Dell [id number]  05/28/2023  8:15 AM [facility] [id number] [facility] None  05/28/2023  9:00 AM [facility] INFUSION [facility] None  05/28/2023  9:45 AM Noreene Larsson, RD [facility] None  05/28/2023 10:30 AM Marguerita Merles, LCSW [facility] None  05/31/2023  4:15 PM [facility] [id number] [facility] None  06/01/2023  4:15 PM [facility] [id number]  [facility] None  06/02/2023  8:15 AM [facility] [id number] [facility] None  06/02/2023  9:45 AM [facility] FLUSH [facility] None  06/02/2023 10:30 AM Dailey Buccheri, MD [facility] None  06/03/2023  4:15 PM [facility] [id number] [facility] None  06/04/2023  8:15 AM [facility] [id number] [facility] None  06/04/2023  9:00 AM [facility] INFUSION [facility] None  06/04/2023 10:30 AM Noreene Larsson, RD [facility] None  06/07/2023  4:15 PM [facility] [id number] [facility] None  06/08/2023  4:15 PM [facility] [id number] [facility] None  06/09/2023  4:15 PM [facility] [id number] [facility] None  06/10/2023 10:45 AM [facility] [id number] [facility] None  06/10/2023 11:15 AM [facility] FLUSH [facility] None  06/10/2023 11:40 AM Dari Carpenito, MD [facility] None  06/11/2023  8:15 AM [facility] LINAC 3 [facility] None  06/11/2023  9:00 AM [facility] INFUSION [facility] None  06/11/2023  9:45 AM Noreene Larsson, RD [facility] None  06/14/2023  4:15 PM [facility] [id number] [facility] None  06/15/2023  4:15 PM [facility] [id number] [facility] None  06/16/2023  4:15 PM [facility] [id number] [facility] None  06/17/2023  2:45 PM [facility] FLUSH [facility] None  06/17/2023  3:15 PM Jetaun Colbath, MD [facility] None  06/17/2023  4:15 PM [facility] [id number] [facility] None  06/18/2023 10:00 AM [facility] INFUSION [facility] None  06/18/2023  4:15 PM [facility] [id number] [facility] None  06/21/2023  4:15 PM [facility] [id number] [facility] None  06/22/2023  4:00 PM [facility] LINAC 4 [facility] None  06/23/2023  4:15 PM [facility] [id number] [facility] None  06/24/2023  3:15 PM [facility] FLUSH [facility] None  06/24/2023  3:45 PM Dresden Lozito, MD [facility] None  06/24/2023  4:15 PM [facility] [id number] [facility] None  06/25/2023 10:00 AM [facility] INFUSION [facility] None  06/25/2023  4:15 PM [facility] [id number] [facility] None      This document was completed utilizing speech  recognition software. Grammatical errors, random word insertions, pronoun errors, and incomplete sentences are an occasional consequence of this system due to software limitations, ambient noise, and hardware issues. Any formal questions or concerns about the content, text or information contained within the body of this dictation should be directly addressed to the provider for clarification.

## 2023-05-26 NOTE — Assessment & Plan Note (Addendum)
-   Please review oncology history for additional details and timeline of events.  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 Unknown p16 status.  Clinically cT2,cN1,cM0.  There is concern for extracapsular extension based on imaging.  Discussed his case in tumor conference on 04/21/2023. Given the size and nature of the lymph node with concern for extracapsular extension, he has a high likelihood of requiring adjuvant chemoradiation if removed surgically.  Hence consensus opinion is to proceed with concurrent chemoradiation.  It was decided to not pursue another biopsy in an attempt to determine p16 status, since the treatment plan would not change.  -Previously I discussed tumor board recommendations with the patient.  He was agreeable to proceeding with concurrent chemoradiation. We have discussed about role of cisplatin being a radiosensitizer in the treatment of head and neck cancer.  We have discussed about the curative intent of chemoradiation for this patient.     Patient is willing to proceed with weekly cisplatin.  Cisplatin will be given at 40 mg/m IV dose weekly during the course of radiation.  He started radiation from 05/10/2023.  Started chemotherapy with cisplatin from 05/14/2023.  Has been tolerating treatments well without any major side effects.  -Labs today revealed no dose-limiting toxicities.  Will proceed with cycle 3 of cisplatin on 05/28/2023 as scheduled.  Patient has nausea medications available at home.  He has established with dietitian for supportive care.  Plan is to continue cisplatin weekly during the course of radiation.

## 2023-05-26 NOTE — Assessment & Plan Note (Addendum)
 Ultrasound of the right lower extremity on 05/20/2023 showed no evidence of DVT.   He did have signs of cellulitis in the right foot.  Started him on empiric antibiotic course with Bactrim DS tablet twice daily for 10 days.  Cellulitis is now resolved.  He was advised to complete Bactrim course.  - Advise alternating heat and ice application to the affected area - Advise elevating the foot and avoiding tight footwear

## 2023-05-27 ENCOUNTER — Ambulatory Visit: Payer: No Typology Code available for payment source | Attending: Radiation Oncology

## 2023-05-27 ENCOUNTER — Ambulatory Visit: Payer: No Typology Code available for payment source | Admitting: Physical Therapy

## 2023-05-27 ENCOUNTER — Other Ambulatory Visit: Payer: Self-pay

## 2023-05-27 ENCOUNTER — Ambulatory Visit
Admission: RE | Admit: 2023-05-27 | Discharge: 2023-05-27 | Discharge status: Home / Self Care | Payer: No Typology Code available for payment source | Source: Ambulatory Visit | Attending: Radiation Oncology

## 2023-05-27 DIAGNOSIS — R1310 Dysphagia, unspecified: Secondary | ICD-10-CM | POA: Diagnosis present

## 2023-05-27 DIAGNOSIS — Z5111 Encounter for antineoplastic chemotherapy: Secondary | ICD-10-CM | POA: Diagnosis not present

## 2023-05-27 DIAGNOSIS — C024 Malignant neoplasm of lingual tonsil: Secondary | ICD-10-CM | POA: Insufficient documentation

## 2023-05-27 LAB — RAD ONC ARIA SESSION SUMMARY
Course Elapsed Days: 17
Plan Fractions Treated to Date: 14
Plan Prescribed Dose Per Fraction: 2 Gy
Plan Total Fractions Prescribed: 35
Plan Total Prescribed Dose: 70 Gy
Reference Point Dosage Given to Date: 28 Gy
Reference Point Session Dosage Given: 2 Gy
Session Number: 14

## 2023-05-27 MED FILL — Fosaprepitant Dimeglumine For IV Infusion 150 MG (Base Eq): INTRAVENOUS | Qty: 5 | Status: AC

## 2023-05-27 NOTE — Patient Instructions (Signed)
 SWALLOWING EXERCISES Do these 5-6 days/week until 6 months after your last day of radiation, then 2 days per week afterwards You can use 1-2 drops of liquid to help you swallow, if your mouth gets dry  Effortful Swallows - Press your tongue against the roof of your mouth for 3 seconds, then swallow as hard as you can - Do at least 20 reps/day, in sets of 5-10  Masako Swallow - swallow with your tongue sticking out - Stick tongue out past your lips and gently bite tongue with your teeth - Swallow, while holding your tongue with your teeth - Do at least 20 reps/day, in sets of 5-10   Shaker Exercise - head lift - Lie flat on your back in your bed, the floor, or a couch  - Raise your head and look at your feet - KEEP YOUR SHOULDERS DOWN - HOLD FOR 45-60 SECONDS, then lower your head back down - Repeat 3 times, 2-3 times a day  [employer] - "squeeze swallow" exercise - Swallow, and squeeze tight to keep your Adam's Apple up - Hold the squeeze for 5-7 seconds - then relax - Do at least 20 reps/day, in sets of 5-10

## 2023-05-27 NOTE — Therapy (Signed)
 OUTPATIENT SPEECH LANGUAGE PATHOLOGY ONCOLOGY EVALUATION   Patient Name: Randy Kaufman MRN: [id number] DOB:[birth year], 63 y.o., male Today's Date: 05/27/2023  PCP: Caffie Damme REFERRING PROVIDER: Lonie Peak  END OF SESSION:  End of Session - 05/27/23 1304     Visit Number 1    Number of Visits 7    Date for SLP Re-Evaluation 08/25/23    SLP Start Time 1303    SLP Stop Time  1346    SLP Time Calculation (min) 43 min    Activity Tolerance Patient tolerated treatment well             History reviewed. No pertinent past medical history. 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 Hx: He presented to his dentist last year with c/o a small lump to his right neck. He was referred to Dr. Pollyann Kennedy.04/02/23 Dr. Pollyann Kennedy noted a palpable 4 cm right level 2 neck mass. No other masses were palpated. He collected a biopsy that day that showed findings consistent with metastatic SCC, p 16 status not obtained due to not enough material. 04/15/23 PET revealing hypermetabolic activity at the right lingual tonsil and right level II  node. The enlarged lymph appeared to measure 4-5 cm in greatest dimension. No evidence of metastatic disease was seen. 04/20/23 Consult with Dr. Basilio Cairo and 04/22/23 consult with Dr. Arlana Pouch. He will receive chemotherapy with radiation.  Treatment plan:  He will receive 35 fractions of radiation to his Right tonsil and bilateral neck with weekly chemotherapy which started on 2/3 and will complete 3/21. Pretreatment procedures: 05/07/23 PEG/PAC  PAIN:  Are you having pain? No (currently), and Yes: NPRS scale: 3 Pain location: throat Pain description: sore, when swallowing Aggravating factors: swallowing Relieving factors: sipping water  FALLS: Has patient fallen in last 6 months?  No  LIVING ENVIRONMENT: Lives with: lives with their spouse Lives in: House/apartment  PLOF:  Level of assistance: Independent with ADLs, Independent with IADLs Employment: Full-time employment  PATIENT GOALS: Maintain WNL swallowing  OBJECTIVE:  Note: Objective measures were completed at Evaluation unless otherwise noted.  COGNITION: Overall cognitive status: Within functional limits for tasks assessed  LANGUAGE: Receptive and Expressive language appeared WNL.  ORAL MOTOR EXAMINATION: Overall status: WFL  MOTOR SPEECH: Overall motor speech: Appears intact  SUBJECTIVE DYSPHAGIA REPORTS:  Date of onset: 05/24/23 Reported symptoms:  more difficult to swallow regular diet foods  Current diet: Dysphagia 3 (mechanical soft) and thin liquids  Co-morbid voice changes: No  FACTORS WHICH MAY INCREASE RISK OF ADVERSE EVENT IN PRESENCE OF ASPIRATION:  General health: well appearing however undergoing ChRT for head/neck  cancer  Risk factors: compromised immune system  CLINICAL SWALLOW ASSESSMENT:   Dentition: adequate natural dentition Vocal quality at baseline: normal Patient directly observed with POs: Yes: dysphagia 3 (soft) and thin liquids  Feeding: able to feed self Liquids provided by:  bottle Oral  phase signs and symptoms: prolonged mastication as solid PO intake progressed - pt stated due to xerostomia Pharyngeal phase signs and symptoms:  none noted                                                                                                                           TREATMENT DATE:   05/27/23 (eval): Research states the risk for dysphagia increases due to radiation and/or chemotherapy treatment due to a variety of factors, so SLP educated the pt about the possibility of reduced/limited ability for PO intake during rad tx. SLP also educated pt regarding possible changes to swallowing musculature after rad tx, and why adherence to dysphagia HEP provided today and PO consumption was necessary to inhibit muscle fibrosis following rad tx and to mitigate muscle disuse atrophy. SLP informed pt why this would be detrimental to their swallowing status and to their pulmonary health. Pt demonstrated understanding of these things to SLP. SLP encouraged pt to safely eat and drink as deep into their radiation/chemotherapy as possible to provide the best possible long-term swallowing outcome for pt.  SLP then developed an individualized HEP for pt involving oral and pharyngeal strengthening and ROM and pt was instructed how to perform these exercises, including SLP demonstration. After SLP demonstration, pt return demonstrated each exercise. SLP ensured pt performance was correct prior to educating pt on next exercise. Pt required usual demo cues faded to modified independent to perform HEP. Pt was instructed to complete this program 6-7 days/week, at least 20 reps a day until 6 months after his or her last day of rad tx, and then x2 a week after that, indefinitely. Among other modifications for days when pt cannot functionally swallow, SLP also suggested pt to perform only non-swallowing tasks on the handout/HEP, and if necessary to cycle through the swallowing portion so the full program of exercises can be  completed instead of fatiguing on one of the swallowing exercises and being unable to perform the other swallowing exercises. SLP instructed that swallowing exercises should then be added back into the regimen as pt is able to do so.    PATIENT EDUCATION: Education details: late effects head/neck radiation on swallow function, HEP procedure, and modification to HEP when difficulty experienced with swallowing during and after radiation course Person educated: Patient and Spouse Education method: Explanation, Demonstration, Verbal cues, and Handouts Education comprehension: verbalized understanding, returned demonstration, verbal cues required, and needs further education   ASSESSMENT:  CLINICAL IMPRESSION: Patient is a 63 y.o. M who was seen today for assessment of swallowing as they undergo radiation/chemoradiation therapy. Today pt ate [place] sandwich and drank thin liquids without overt s/s oral or pharyngeal difficulty. At this time  pt swallowing is deemed WNL/WFL with these POs. No oral or overt s/sx pharyngeal deficits, including aspiration were observed. There are no overt s/s aspiration PNA observed by SLP nor any reported by pt at this time. Data indicate that pt's swallow ability will likely decrease over the course of radiation/chemoradiation therapy and could very well decline over time following the conclusion of that therapy due to muscle disuse atrophy and/or muscle fibrosis. Pt will cont to need to be seen by SLP in order to assess safety of PO intake, assess the need for recommending any objective swallow assessment, and ensuring pt is correctly completing the individualized HEP.  OBJECTIVE IMPAIRMENTS: include dysphagia. These impairments are limiting patient from safety when swallowing. Factors affecting potential to achieve goals and functional outcome are  none . Patient will benefit from skilled SLP services to address above impairments and improve overall function.  REHAB  POTENTIAL: Excellent   GOALS: Goals reviewed with patient? Generally, yes   SHORT TERM GOALS: Target: 3rd total session   1. Pt will compelte HEP with modified independence in 2 sessions Baseline: Goal status: INITIAL   2.  pt will tell SLP why pt is completing HEP with modified independence Baseline:  Goal status: INITIAL   3.  pt will describe 3 overt s/s aspiration PNA with modified independence Baseline:  Goal status: INITIAL   4.  pt will tell SLP how a food journal could ease return to a more normalized diet Baseline:  Goal status: INITIAL     LONG TERM GOALS: Target: 7th total session   1.  pt will complete HEP with independence over two visits Baseline:  Goal status: INITIAL   2.  pt will describe how to modify HEP over time, and the timeline associated with reduction in HEP frequency with modified independence over two sessions Baseline:  Goal status: INITIAL   PLAN:   SLP FREQUENCY:  once approx every 4 weeks   SLP DURATION:  7 sessions   PLANNED INTERVENTIONS: Aspiration precaution training, Pharyngeal strengthening exercises, Diet toleration management , Trials of upgraded texture/liquids, SLP instruction and feedback, Compensatory strategies, and Patient/family education  [facility], CCC-SLP 05/27/2023, 1:48 PM

## 2023-05-28 ENCOUNTER — Other Ambulatory Visit: Payer: Self-pay

## 2023-05-28 ENCOUNTER — Inpatient Hospital Stay: Payer: No Typology Code available for payment source

## 2023-05-28 ENCOUNTER — Inpatient Hospital Stay: Payer: No Typology Code available for payment source | Admitting: Dietician

## 2023-05-28 ENCOUNTER — Ambulatory Visit
Admission: RE | Admit: 2023-05-28 | Discharge: 2023-05-28 | Disposition: A | Discharge status: Home / Self Care | Payer: No Typology Code available for payment source | Source: Ambulatory Visit | Attending: Radiation Oncology | Admitting: Radiation Oncology

## 2023-05-28 VITALS — BP 135/71 | HR 59 | Temp 97.7°F | Resp 18 | Wt 205.2 lb

## 2023-05-28 DIAGNOSIS — Z5111 Encounter for antineoplastic chemotherapy: Secondary | ICD-10-CM | POA: Diagnosis not present

## 2023-05-28 DIAGNOSIS — C024 Malignant neoplasm of lingual tonsil: Secondary | ICD-10-CM

## 2023-05-28 LAB — RAD ONC ARIA SESSION SUMMARY
Course Elapsed Days: 18
Plan Fractions Treated to Date: 15
Plan Prescribed Dose Per Fraction: 2 Gy
Plan Total Fractions Prescribed: 35
Plan Total Prescribed Dose: 70 Gy
Reference Point Dosage Given to Date: 30 Gy
Reference Point Session Dosage Given: 2 Gy
Session Number: 15

## 2023-05-28 MED ORDER — HEPARIN SOD (PORK) LOCK FLUSH 100 UNIT/ML IV SOLN: 500.0000 [IU] | Freq: Once | INTRAVENOUS | Status: DC | PRN

## 2023-05-28 MED ORDER — DEXAMETHASONE SODIUM PHOSPHATE 10 MG/ML IJ SOLN
10.0000 mg | Freq: Once | INTRAMUSCULAR | Status: AC
  Administered 2023-05-28: 10 mg via INTRAVENOUS
  Filled 2023-05-28: qty 1

## 2023-05-28 MED ORDER — SODIUM CHLORIDE 0.9 % IV SOLN
150.0000 mg | Freq: Once | INTRAVENOUS | Status: AC
  Administered 2023-05-28: 150 mg via INTRAVENOUS
  Filled 2023-05-28: qty 150

## 2023-05-28 MED ORDER — POTASSIUM CHLORIDE IN NACL 20-0.9 MEQ/L-% IV SOLN
Freq: Once | INTRAVENOUS | Status: AC
  Filled 2023-05-28: qty 1000

## 2023-05-28 MED ORDER — SODIUM CHLORIDE 0.9 % IV SOLN
40.0000 mg/m2 | Freq: Once | INTRAVENOUS | Status: AC
  Administered 2023-05-28: 88 mg via INTRAVENOUS
  Filled 2023-05-28: qty 88

## 2023-05-28 MED ORDER — SODIUM CHLORIDE 0.9 % IV SOLN: INTRAVENOUS | Status: DC

## 2023-05-28 MED ORDER — PALONOSETRON HCL INJECTION 0.25 MG/5ML
0.2500 mg | Freq: Once | INTRAVENOUS | Status: AC
Start: 2023-05-28 — End: 2023-05-28
  Administered 2023-05-28: 0.25 mg via INTRAVENOUS
  Filled 2023-05-28: qty 5

## 2023-05-28 MED ORDER — SODIUM CHLORIDE 0.9% FLUSH: 10.0000 mL | INTRAVENOUS | Status: DC | PRN

## 2023-05-28 MED ORDER — MAGNESIUM SULFATE 2 GM/50ML IV SOLN
2.0000 g | Freq: Once | INTRAVENOUS | Status: AC
  Administered 2023-05-28: 2 g via INTRAVENOUS
  Filled 2023-05-28: qty 50

## 2023-05-28 NOTE — Progress Notes (Signed)
 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 He is receiving concurrent chemoradiation (RT start 2/3, chemo 2/7). Patient is under the care of Dr. Basilio Cairo and Dr. Arlana Pouch.    S/p PEG 1/31  Met with patient and wife in infusion. Patient is doing well today. Reports lower extremity pain/swelling has resolved. He continues taking antibiotics for this. Patient reports eating and drinking well. He is tolerating regular textures without difficulty. Patient endorses mouth dryness. He is drinking 64 ounces of water and provided daily 120 FWF. He tried KF shakes and liked these. Patient denies nausea, vomiting, diarrhea, constipation.    Medications: reviewed   Labs: reviewed   Anthropometrics: Wt 205 lb 4 oz today  2/14 - 206 lb 12 oz  2/7 - 206 lb 8 oz   Estimated Energy Needs  Kcals: 2700-3000 Protein: 122-140 Fluid: >2.7 L  NUTRITION DIAGNOSIS: Unintended wt loss - stable   INTERVENTION:  Continue regular diet as tolerated  Recommend daily ONS for added calories and protein - KF samples provided  Continue daily FWF - suggested increased if po fluid decreases     MONITORING, EVALUATION, GOAL: wt trends, intake   NEXT VISIT: Friday February 28 during infusion

## 2023-05-28 NOTE — Progress Notes (Signed)
 Okay to run post hydration with Cisplatin today per provider.

## 2023-05-28 NOTE — Progress Notes (Signed)
 [facility] CSW Progress Note  Visual merchandiser  met with patient and spouse briefly   to assess pyschosocial needs. Patient is experiencing side effects of treatment and denied any major concerns. Patient and spouse have contact if needed.   Marguerita Merles, LCSW Clinical Social Worker [facility]

## 2023-05-28 NOTE — Patient Instructions (Signed)

## 2023-05-31 ENCOUNTER — Ambulatory Visit: Payer: No Typology Code available for payment source

## 2023-05-31 ENCOUNTER — Ambulatory Visit
Admission: RE | Admit: 2023-05-31 | Discharge: 2023-05-31 | Disposition: A | Discharge status: Home / Self Care | Payer: No Typology Code available for payment source | Source: Ambulatory Visit | Attending: Radiation Oncology | Admitting: Radiation Oncology

## 2023-05-31 ENCOUNTER — Other Ambulatory Visit: Payer: Self-pay

## 2023-05-31 DIAGNOSIS — Z5111 Encounter for antineoplastic chemotherapy: Secondary | ICD-10-CM | POA: Diagnosis not present

## 2023-05-31 LAB — RAD ONC ARIA SESSION SUMMARY
Course Elapsed Days: 21
Plan Fractions Treated to Date: 16
Plan Prescribed Dose Per Fraction: 2 Gy
Plan Total Fractions Prescribed: 35
Plan Total Prescribed Dose: 70 Gy
Reference Point Dosage Given to Date: 32 Gy
Reference Point Session Dosage Given: 2 Gy
Session Number: 16

## 2023-06-01 ENCOUNTER — Ambulatory Visit
Admission: RE | Admit: 2023-06-01 | Discharge: 2023-06-01 | Disposition: A | Discharge status: Home / Self Care | Payer: No Typology Code available for payment source | Source: Ambulatory Visit | Attending: Radiation Oncology

## 2023-06-01 ENCOUNTER — Other Ambulatory Visit: Payer: Self-pay

## 2023-06-01 DIAGNOSIS — Z5111 Encounter for antineoplastic chemotherapy: Secondary | ICD-10-CM | POA: Diagnosis not present

## 2023-06-01 LAB — RAD ONC ARIA SESSION SUMMARY
Course Elapsed Days: 22
Plan Fractions Treated to Date: 17
Plan Prescribed Dose Per Fraction: 2 Gy
Plan Total Fractions Prescribed: 35
Plan Total Prescribed Dose: 70 Gy
Reference Point Dosage Given to Date: 34 Gy
Reference Point Session Dosage Given: 2 Gy
Session Number: 17

## 2023-06-02 ENCOUNTER — Ambulatory Visit
Admission: RE | Admit: 2023-06-02 | Discharge: 2023-06-02 | Disposition: A | Discharge status: Home / Self Care | Payer: No Typology Code available for payment source | Source: Ambulatory Visit | Attending: Radiation Oncology

## 2023-06-02 ENCOUNTER — Other Ambulatory Visit: Payer: Self-pay

## 2023-06-02 ENCOUNTER — Other Ambulatory Visit: Payer: No Typology Code available for payment source

## 2023-06-02 ENCOUNTER — Inpatient Hospital Stay (HOSPITAL_BASED_OUTPATIENT_CLINIC_OR_DEPARTMENT_OTHER): Payer: No Typology Code available for payment source | Admitting: Oncology

## 2023-06-02 ENCOUNTER — Inpatient Hospital Stay: Payer: No Typology Code available for payment source

## 2023-06-02 ENCOUNTER — Encounter: Payer: Self-pay | Admitting: Oncology

## 2023-06-02 VITALS — BP 110/69 | HR 63 | Temp 97.6°F | Resp 16 | Ht 72.0 in | Wt 200.4 lb

## 2023-06-02 DIAGNOSIS — C024 Malignant neoplasm of lingual tonsil: Secondary | ICD-10-CM

## 2023-06-02 DIAGNOSIS — L03115 Cellulitis of right lower limb: Secondary | ICD-10-CM | POA: Diagnosis not present

## 2023-06-02 DIAGNOSIS — Z5111 Encounter for antineoplastic chemotherapy: Secondary | ICD-10-CM | POA: Diagnosis not present

## 2023-06-02 DIAGNOSIS — Z95828 Presence of other vascular implants and grafts: Secondary | ICD-10-CM

## 2023-06-02 LAB — CBC WITH DIFFERENTIAL (CANCER CENTER ONLY)
Abs Immature Granulocytes: 0.01 10*3/uL (ref 0.00–0.07)
Basophils Absolute: 0 10*3/uL (ref 0.0–0.1)
Basophils Relative: 1 %
Eosinophils Absolute: 0 10*3/uL (ref 0.0–0.5)
Eosinophils Relative: 0 %
HCT: 36.7 % — ABNORMAL LOW (ref 39.0–52.0)
Hemoglobin: 12.5 g/dL — ABNORMAL LOW (ref 13.0–17.0)
Immature Granulocytes: 0 %
Lymphocytes Relative: 9 %
Lymphs Abs: 0.3 10*3/uL — ABNORMAL LOW (ref 0.7–4.0)
MCH: 30.3 pg (ref 26.0–34.0)
MCHC: 34.1 g/dL (ref 30.0–36.0)
MCV: 88.9 fL (ref 80.0–100.0)
Monocytes Absolute: 0.5 10*3/uL (ref 0.1–1.0)
Monocytes Relative: 14 %
Neutro Abs: 3 10*3/uL (ref 1.7–7.7)
Neutrophils Relative %: 76 %
Platelet Count: 175 10*3/uL (ref 150–400)
RBC: 4.13 MIL/uL — ABNORMAL LOW (ref 4.22–5.81)
RDW: 11.4 % — ABNORMAL LOW (ref 11.5–15.5)
WBC Count: 3.9 10*3/uL — ABNORMAL LOW (ref 4.0–10.5)
nRBC: 0 % (ref 0.0–0.2)

## 2023-06-02 LAB — RAD ONC ARIA SESSION SUMMARY
Course Elapsed Days: 23
Plan Fractions Treated to Date: 18
Plan Prescribed Dose Per Fraction: 2 Gy
Plan Total Fractions Prescribed: 35
Plan Total Prescribed Dose: 70 Gy
Reference Point Dosage Given to Date: 36 Gy
Reference Point Session Dosage Given: 2 Gy
Session Number: 18

## 2023-06-02 LAB — BASIC METABOLIC PANEL - CANCER CENTER ONLY
Anion gap: 6 (ref 5–15)
BUN: 19 mg/dL (ref 8–23)
CO2: 26 mmol/L (ref 22–32)
Calcium: 9.4 mg/dL (ref 8.9–10.3)
Chloride: 105 mmol/L (ref 98–111)
Creatinine: 0.86 mg/dL (ref 0.61–1.24)
GFR, Estimated: >60 mL/min (ref >60)
Glucose, Bld: 97 mg/dL (ref 70–99)
Potassium: 4.2 mmol/L (ref 3.5–5.1)
Sodium: 137 mmol/L (ref 135–145)

## 2023-06-02 LAB — MAGNESIUM: Magnesium: 1.7 mg/dL (ref 1.7–2.4)

## 2023-06-02 MED ORDER — HEPARIN SOD (PORK) LOCK FLUSH 100 UNIT/ML IV SOLN
500.0000 [IU] | Freq: Once | INTRAVENOUS | Status: AC
  Administered 2023-06-02: 500 [IU]

## 2023-06-02 MED ORDER — SODIUM CHLORIDE 0.9% FLUSH
10.0000 mL | Freq: Once | INTRAVENOUS | Status: AC
Start: 2023-06-02 — End: 2023-06-02
  Administered 2023-06-02: 10 mL

## 2023-06-02 NOTE — Assessment & Plan Note (Addendum)
-   Please review oncology history for additional details and timeline of events.  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 Unknown p16 status.  Clinically cT2,cN1,cM0.  There is concern for extracapsular extension based on imaging.  Discussed his case in tumor conference on 04/21/2023. Given the size and nature of the lymph node with concern for extracapsular extension, he has a high likelihood of requiring adjuvant chemoradiation if removed surgically.  Hence consensus opinion is to proceed with concurrent chemoradiation.  It was decided to not pursue another biopsy in an attempt to determine p16 status, since the treatment plan would not change.  -Previously I discussed tumor board recommendations with the patient.  He was agreeable to proceeding with concurrent chemoradiation. We have discussed about role of cisplatin being a radiosensitizer in the treatment of head and neck cancer.  We have discussed about the curative intent of chemoradiation for this patient.     Patient is willing to proceed with weekly cisplatin.  Cisplatin will be given at 40 mg/m IV dose weekly during the course of radiation.  He started radiation from 05/10/2023.  Started chemotherapy with cisplatin from 05/14/2023.  Has been tolerating treatments well without any major side effects.  -Labs today revealed no dose-limiting toxicities.  Will proceed with cycle 4 of cisplatin on 06/04/2023 as scheduled.  Patient has nausea medications available at home.  He has established with dietitian for supportive care.  Plan is to continue cisplatin weekly during the course of radiation.

## 2023-06-02 NOTE — Progress Notes (Signed)
 [facility]  ONCOLOGY CLINIC PROGRESS NOTE   Patient Care Team: Caffie Damme, MD as PCP - General (Family Medicine) Lonie Peak, MD as Attending Physician (Radiation Oncology) Serena Colonel, MD as Consulting Physician (Otolaryngology) Malmfelt, Lise Auer, RN as Oncology Nurse Navigator Meryl Crutch, MD as Consulting Physician (Oncology)  PATIENT NAME: Randy Kaufman   MR#: [id number] DOB: [birth year]  Date of visit: 06/02/2023   ASSESSMENT & PLAN:   Randy Kaufman is a 63 y.o.  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 Unknown p16 status.   Lingual tonsil carcinoma (HCC) - Please review oncology history for additional details and timeline of events.  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 Unknown p16 status.  Clinically cT2,cN1,cM0.  There is concern for extracapsular extension based on imaging.  Discussed his case in tumor conference on 04/21/2023. Given the size and nature of the lymph node with concern for extracapsular extension, he has a high likelihood of requiring adjuvant chemoradiation if removed surgically.  Hence consensus opinion is to proceed with concurrent chemoradiation.  It was decided to not pursue another biopsy in an attempt to determine p16 status, since the treatment plan would not change.  -Previously I discussed tumor board recommendations with the patient.  He was agreeable to proceeding with concurrent chemoradiation. We have discussed about role of cisplatin being a radiosensitizer in the treatment of head and neck cancer.  We have discussed about the curative intent of chemoradiation for this patient.     Patient is willing to proceed with weekly cisplatin.   Cisplatin will be given at 40 mg/m IV dose weekly during the course of radiation.  He started radiation from 05/10/2023.  Started chemotherapy with cisplatin from 05/14/2023.  Has been tolerating treatments well without any major side effects.  -Labs today revealed no dose-limiting toxicities.  Will proceed with cycle 4 of cisplatin on 06/04/2023 as scheduled.  Patient has nausea medications available at home.  He has established with dietitian for supportive care.  Plan is to continue cisplatin weekly during the course of radiation.   Cellulitis of right foot Ultrasound of the right lower extremity on 05/20/2023 showed no evidence of DVT.   He did have signs of cellulitis in the right foot.  Started him on empiric antibiotic course with Bactrim DS tablet twice daily for 10 days.  Cellulitis is now resolved.   - Advise alternating heat and ice application to the affected area - Advise elevating the foot and avoiding tight footwear     RTC in 1 week for labs, follow-up and continuation of chemotherapy.  I reviewed lab results and outside records for this visit and discussed relevant results with the patient. Diagnosis, plan of care and treatment options were also discussed in detail with the patient. Opportunity provided to ask questions and answers provided to his apparent satisfaction. Provided instructions to call our clinic with any problems, questions or concerns prior to return visit. I recommended to continue follow-up with PCP and sub-specialists. He verbalized understanding and agreed with the plan.   NCCN guidelines have been consulted in the planning of this patient's care.  I spent a total of 30 minutes during this encounter with the patient including review of chart and various tests results, discussions about plan of  care and coordination of care plan.   Meryl Crutch, MD  06/02/2023 3:13 PM  [facility] - A DEPT OF [facility] [address] [place] [address] Dept: [phone] Dept Fax: [phone]    CHIEF COMPLAINT/ REASON FOR VISIT:   Squamous cell carcinoma consistent with a right lingual tonsillar primary with a metastatic ipsilateral cervical node. Clinically cT2,cN1,cM0.   Current Treatment: Concurrent chemoradiation with weekly cisplatin started during the week of 05/10/2023.  INTERVAL HISTORY:    Discussed the use of AI scribe software for clinical note transcription with the patient, who gave verbal consent to proceed.   Randy Kaufman is here today for repeat clinical assessment.   He reports difficulty swallowing and a change in taste, leading to a shift in diet towards protein drinks and hydration. They have been maintaining hydration through hydration therapy and hydration drinks, aiming for sixty-four ounces of water a day. The patient also continues to consume coffee. Despite these measures, the patient reports that food still tastes terrible.  The patient has been working with a physical therapist on exercises to improve swallowing. They also report a persistent cough, which they attribute to inadequate hydration. The patient has been experiencing sleep disturbances, with difficulty staying asleep. They express a desire for a sleep aid to help maintain sleep.  The patient also mentions a decrease in weight, which has led to a perceived increase in the size of their tumor. They express concern about the firmness of the tumor, noting that it feels firmer than before but not as hard as when treatment started. The patient also reports redness in the back of their throat but no ulcers.  I have reviewed the past medical history, past surgical history, social history and family history with the patient and they are unchanged from previous note.  HISTORY OF PRESENT ILLNESS:   ONCOLOGY HISTORY:   He presented to his dentist last year with c/o of a small lump to his  right neck. He was subsequently referred to Dr. Pollyann Kennedy at [facility], on 04/02/23 for further evaluation. Physical exam performed at that time noted a palpable 4 cm right level 2 neck mass. No other masses were palpated.    A biopsy of the right neck mass was collected that date (04/02/23) and showed findings consistent with metastatic squamous cell carcinoma.  There was insufficient tissue to check for p16 status.   Staging PET scan on 04/14/22 revealed hypermetabolic activity at the right lingual tonsil and right level II node. The enlarged lymph node appeared to measure approximately 2.6 cm in greatest dimension. No evidence of distant metastatic disease was seen.    He later had CT soft [facility] neck at [facility].   Unknown p16 status.  Clinically cT2,cN1,cM0.  There is concern for extracapsular extension based on imaging.   Discussed his case in tumor conference on 04/21/2023. Given the size and nature of the lymph node with concern for extracapsular extension, he has a high likelihood of requiring adjuvant chemoradiation if removed surgically.  Hence consensus opinion was to proceed with concurrent chemoradiation. It was decided to not pursue another biopsy in an attempt to determine p16 status, since the treatment plan would not change.  He started radiation treatments from 05/10/2023.  Started chemotherapy with cisplatin from 05/14/2023.  Oncology History  Lingual tonsil carcinoma (HCC)  04/20/2023 Initial Diagnosis   Lingual tonsil carcinoma (HCC)   04/22/2023  Cancer Staging   Staging form: Pharynx - HPV-Mediated Oropharynx, AJCC 8th Edition - Clinical: Stage I (cT2, cN1, cM0, p16: Unknown, HPV: Unknown) - Signed by Meryl Crutch, MD on 04/22/2023   05/14/2023 -  Chemotherapy   Patient is on Treatment Plan : HEAD/NECK Cisplatin (40) q7d         REVIEW OF SYSTEMS:   Review of Systems - Oncology  All other pertinent systems were reviewed with the patient and are  negative.  ALLERGIES: He has no known allergies.  MEDICATIONS:  Current Outpatient Medications  Medication Sig Dispense Refill   dexamethasone (DECADRON) 4 MG tablet Take 2 tablets (8 mg) by mouth daily x 3 days starting the day after cisplatin chemotherapy. Take with food. 30 tablet 1   finasteride (PROSCAR) 5 MG tablet Take 5 mg by mouth daily. Takes 1/2 Tab to equal 2.5 mg daily     lidocaine-prilocaine (EMLA) cream Apply to affected area once 30 g 3   prochlorperazine (COMPAZINE) 10 MG tablet Take 1 tablet (10 mg total) by mouth every 6 (six) hours as needed (Nausea or vomiting). 30 tablet 1   Biotin 1000 MCG tablet Take 1,000 mcg by mouth 3 (three) times daily as needed (Dry mouth). (Patient not taking: Reported on 06/02/2023)     lidocaine (XYLOCAINE) 2 % solution Patient: Mix 1part 2% viscous lidocaine, 1part H20. Swallow 10mL of diluted mixture, before meals and at bedtime, up to QID (Patient not taking: Reported on 06/02/2023) 200 mL 3   ondansetron (ZOFRAN) 8 MG tablet Take 1 tablet (8 mg total) by mouth every 8 (eight) hours as needed for nausea or vomiting. Start on the third day after cisplatin. (Patient not taking: Reported on 06/02/2023) 30 tablet 1   tadalafil (CIALIS) 20 MG tablet Take 20 mg by mouth daily as needed. (Patient not taking: Reported on 06/02/2023)     traMADol (ULTRAM) 50 MG tablet Take 1 tablet (50 mg total) by mouth every 6 (six) hours as needed. (Patient not taking: Reported on 06/02/2023) 60 tablet 0   No current facility-administered medications for this visit.     VITALS:   Blood pressure 110/69, pulse 63, temperature 97.6 F (36.4 C), temperature source Temporal, resp. rate 16, height 6' (1.829 m), weight 200 lb 6.4 oz (90.9 kg), SpO2 100%.  Wt Readings from Last 3 Encounters:  06/02/23 200 lb 6.4 oz (90.9 kg)  05/28/23 205 lb 4 oz (93.1 kg)  05/26/23 240 lb (108.9 kg)    Body mass index is 27.18 kg/m.    Onc Performance Status - 06/02/23 0937        ECOG Perf Status   ECOG Perf Status Fully active, able to carry on all pre-disease performance without restriction      KPS SCALE   KPS % SCORE Normal, no compliants, no evidence of disease              PHYSICAL EXAM:   Physical Exam Constitutional:      General: He is not in acute distress.    Appearance: Normal appearance.  HENT:     Head: Normocephalic and atraumatic.     Mouth/Throat:     Comments: Slight erythema in the back of the throat Eyes:     General: No scleral icterus.    Conjunctiva/sclera: Conjunctivae normal.  Cardiovascular:     Rate and Rhythm: Normal rate and regular rhythm.     Pulses: Normal pulses.     Heart sounds: Normal heart sounds.  Pulmonary:     Effort: Pulmonary effort is normal.     Breath sounds: Normal breath sounds.  Chest:     Comments: Right-sided Port-A-Cath in place, without any evidence of infection Abdominal:     General: There is no distension.     Comments: Feeding tube in place.  No signs of infection.  Musculoskeletal:     Right lower leg: No edema.     Left lower leg: No edema.  Lymphadenopathy:     Cervical: Cervical adenopathy (firm, fixed, right sided LN, improved compared to prior) present.  Skin:    Comments: Right foot cellulitis has resolved  Neurological:     General: No focal deficit present.     Mental Status: He is alert and oriented to person, place, and time.  Psychiatric:        Mood and Affect: Mood normal.        Behavior: Behavior normal.        Thought Content: Thought content normal.       LABORATORY DATA:   I have reviewed the data as listed.  Results for orders placed or performed in visit on 06/02/23  CBC with Differential ([facility])  Result Value Ref Range   WBC Count 3.9 (L) 4.0 - 10.5 K/uL   RBC 4.13 (L) 4.22 - 5.81 MIL/uL   Hemoglobin 12.5 (L) 13.0 - 17.0 g/dL   HCT 16.1 (L) 09.6 - 04.5 %   MCV 88.9 80.0 - 100.0 fL   MCH 30.3 26.0 - 34.0 pg   MCHC 34.1 30.0 -  36.0 g/dL   RDW 40.9 (L) 81.1 - 91.4 %   Platelet Count 175 150 - 400 K/uL   nRBC 0.0 0.0 - 0.2 %   Neutrophils Relative % 76 %   Neutro Abs 3.0 1.7 - 7.7 K/uL   Lymphocytes Relative 9 %   Lymphs Abs 0.3 (L) 0.7 - 4.0 K/uL   Monocytes Relative 14 %   Monocytes Absolute 0.5 0.1 - 1.0 K/uL   Eosinophils Relative 0 %   Eosinophils Absolute 0.0 0.0 - 0.5 K/uL   Basophils Relative 1 %   Basophils Absolute 0.0 0.0 - 0.1 K/uL   Immature Granulocytes 0 %   Abs Immature Granulocytes 0.01 0.00 - 0.07 K/uL  Basic Metabolic Panel - [facility]  Result Value Ref Range   Sodium 137 135 - 145 mmol/L   Potassium 4.2 3.5 - 5.1 mmol/L   Chloride 105 98 - 111 mmol/L   CO2 26 22 - 32 mmol/L   Glucose, Bld 97 70 - 99 mg/dL   BUN 19 8 - 23 mg/dL   Creatinine 7.82 9.56 - 1.24 mg/dL   Calcium 9.4 8.9 - 21.3 mg/dL   GFR, Estimated >08 >65 mL/min   Anion gap 6 5 - 15  Magnesium  Result Value Ref Range   Magnesium 1.7 1.7 - 2.4 mg/dL  Results for orders placed or performed in visit on 06/02/23  Rad Onc Aria Session Summary  Result Value Ref Range   Course ID [id number]    Course Start Date 04/30/2023    Session Number 18    Course First Treatment Date 05/10/2023  1:00 PM    Course Last Treatment Date 06/02/2023  8:24 AM    Course Elapsed Days 23    Reference Point ID HN dp    Reference Point Dosage Given to Date 36 Gy   Reference Point Session Dosage Given 2 Gy  Plan ID [id number]    Plan Fractions Treated to Date 18    Plan Total Fractions Prescribed 35    Plan Prescribed Dose Per Fraction 2 Gy   Plan Total Prescribed Dose 70.000000 Gy   Plan Primary Reference Point HN dp      RADIOGRAPHIC STUDIES:  I have personally reviewed the radiological images as listed and agree with the findings in the report.  VAS [place] LOWER EXTREMITY VENOUS (DVT) Result Date: 05/20/2023  Lower Venous DVT Study Patient Name:  KIER SMEAD  Date of Exam:   05/20/2023 Medical Rec #: [id number]           Accession #:    [id number] Date of Birth: 1960/10/26           Patient Gender: M Patient Age:   59 years Exam Location:  [facility] Procedure:      VAS [place] LOWER EXTREMITY VENOUS (DVT) Referring Phys: Archie Patten Rendy Lazard --------------------------------------------------------------------------------  Indications: Pain.  Risk Factors: Cancer Carcinoma. Comparison Study: None. Performing Technologist: Shona Simpson  Examination Guidelines: A complete evaluation includes B-mode imaging, spectral Doppler, color Doppler, and power Doppler as needed of all accessible portions of each vessel. Bilateral testing is considered an integral part of a complete examination. Limited examinations for reoccurring indications may be performed as noted. The reflux portion of the exam is performed with the patient in reverse Trendelenburg.  +---------+---------------+---------+-----------+----------+--------------+ RIGHT    CompressibilityPhasicitySpontaneityPropertiesThrombus Aging +---------+---------------+---------+-----------+----------+--------------+ CFV      Full           Yes      Yes                                 +---------+---------------+---------+-----------+----------+--------------+ SFJ      Full                                                        +---------+---------------+---------+-----------+----------+--------------+ FV Prox  Full                                                        +---------+---------------+---------+-----------+----------+--------------+ FV Mid   Full                                                        +---------+---------------+---------+-----------+----------+--------------+ FV DistalFull                                                        +---------+---------------+---------+-----------+----------+--------------+ PFV      Full                                                         +---------+---------------+---------+-----------+----------+--------------+  POP      Full           Yes      Yes                                 +---------+---------------+---------+-----------+----------+--------------+ PTV      Full                                                        +---------+---------------+---------+-----------+----------+--------------+ PERO     Full                                                        +---------+---------------+---------+-----------+----------+--------------+   +----+---------------+---------+-----------+----------+--------------+ LEFTCompressibilityPhasicitySpontaneityPropertiesThrombus Aging +----+---------------+---------+-----------+----------+--------------+ CFV Full           Yes      Yes                                 +----+---------------+---------+-----------+----------+--------------+     Summary: RIGHT: - There is no evidence of deep vein thrombosis in the lower extremity.  - No cystic structure found in the popliteal fossa.  LEFT: - No evidence of common femoral vein obstruction.   *See table(s) above for measurements and observations. Electronically signed by Heath Lark on 05/20/2023 at 4:07:29 PM.    Final    IR GASTROSTOMY TUBE MOD SED Result Date: 05/07/2023 INDICATION: Chemo/radiation for head and neck cancer EXAM: Procedures: 1. FLUOROSCOPIC NASOGASTRIC TUBE PLACEMENT 2. PERCUTANEOUS GASTROSTOMY FEEDING TUBE PLACEMENT COMPARISON:  PET-CT, 04/15/2023. MEDICATIONS: Ancef 2 gm IV; Antibiotics were administered within 1 hour of the procedure. 25 mg Benadryl IV. CONTRAST:  20 mL of Isovue 300 administered into the gastric lumen. ANESTHESIA/SEDATION: Moderate (conscious) sedation was employed during this procedure. A total of Versed 1 mg and Fentanyl 50 mcg was administered intravenously. Moderate Sedation Time: 20 minutes. The patient's level of consciousness and vital signs were monitored continuously by radiology  nursing throughout the procedure under my direct supervision. FLUOROSCOPY TIME:  Fluoroscopic dose; 6.6 mGy COMPLICATIONS: None immediate. PROCEDURE: Informed written consent was obtained from the patient and/or patient's representative following explanation of the procedure, risks, benefits and alternatives. A time out was performed prior to the initiation of the procedure. Maximal barrier sterile technique utilized including caps, mask, sterile gowns, sterile gloves, large sterile drape, hand hygiene and sterile prep. The LEFT upper quadrant was sterilely prepped and draped. A oral gastric catheter was inserted into the stomach under fluoroscopy. The LEFT costal margin and barium opacified transverse colon were identified and avoided. Air was injected into the stomach for insufflation and visualization under fluoroscopy. Under sterile conditions and local anesthesia, 2 T tacks were utilized to pexy the anterior aspect of the stomach against the ventral abdominal wall. Contrast injection confirmed appropriate positioning of each of the T tacks. An incision was made between the T tacks and a 17 gauge trocar needle was utilized to access the stomach. Needle position was confirmed within the stomach with aspiration of air and injection of a small amount of contrast. A stiff  guidewire was advanced into the gastric lumen and under intermittent fluoroscopic guidance, the access needle was exchanged for a telescoping peel-away sheath, ultimately allowing placement of a 16 Fr balloon retention gastrostomy tube. The retention balloon was insufflated with a mixture of dilute saline and contrast and pulled taut against the anterior wall of the stomach. The external disc was cinched. Contrast injection confirms positioning within the stomach. Several spot radiographic images were obtained in various obliquities for documentation. The patient tolerated procedure well without immediate post procedural complication. FINDINGS:  After successful fluoroscopic guided placement, the gastrostomy tube is appropriately positioned with internal retention balloon against the ventral aspect of the gastric lumen. IMPRESSION: Successful fluoroscopic insertion of a 16 Fr balloon retention gastrostomy tube. The gastrostomy may be used immediately for medication administration and in 4 hrs for the initiation of feeds. RECOMMENDATIONS: The patient will return to Vascular Interventional Radiology (VIR) for routine feeding tube evaluation and exchange in 6 months. Roanna Banning, MD [facility] Electronically Signed   By: Roanna Banning M.D.   On: 05/07/2023 17:07   IR Naso G Tube Plc W/FL W/Rad Result Date: 05/07/2023 INDICATION: Chemo/radiation for head and neck cancer EXAM: Procedures: 1. FLUOROSCOPIC NASOGASTRIC TUBE PLACEMENT 2. PERCUTANEOUS GASTROSTOMY FEEDING TUBE PLACEMENT COMPARISON:  PET-CT, 04/15/2023. MEDICATIONS: Ancef 2 gm IV; Antibiotics were administered within 1 hour of the procedure. 25 mg Benadryl IV. CONTRAST:  20 mL of Isovue 300 administered into the gastric lumen. ANESTHESIA/SEDATION: Moderate (conscious) sedation was employed during this procedure. A total of Versed 1 mg and Fentanyl 50 mcg was administered intravenously. Moderate Sedation Time: 20 minutes. The patient's level of consciousness and vital signs were monitored continuously by radiology nursing throughout the procedure under my direct supervision. FLUOROSCOPY TIME:  Fluoroscopic dose; 6.6 mGy COMPLICATIONS: None immediate. PROCEDURE: Informed written consent was obtained from the patient and/or patient's representative following explanation of the procedure, risks, benefits and alternatives. A time out was performed prior to the initiation of the procedure. Maximal barrier sterile technique utilized including caps, mask, sterile gowns, sterile gloves, large sterile drape, hand hygiene and sterile prep. The LEFT  upper quadrant was sterilely prepped and draped. A oral gastric catheter was inserted into the stomach under fluoroscopy. The LEFT costal margin and barium opacified transverse colon were identified and avoided. Air was injected into the stomach for insufflation and visualization under fluoroscopy. Under sterile conditions and local anesthesia, 2 T tacks were utilized to pexy the anterior aspect of the stomach against the ventral abdominal wall. Contrast injection confirmed appropriate positioning of each of the T tacks. An incision was made between the T tacks and a 17 gauge trocar needle was utilized to access the stomach. Needle position was confirmed within the stomach with aspiration of air and injection of a small amount of contrast. A stiff guidewire was advanced into the gastric lumen and under intermittent fluoroscopic guidance, the access needle was exchanged for a telescoping peel-away sheath, ultimately allowing placement of a 16 Fr balloon retention gastrostomy tube. The retention balloon was insufflated with a mixture of dilute saline and contrast and pulled taut against the anterior wall of the stomach. The external disc was cinched. Contrast injection confirms positioning within the stomach. Several spot radiographic images were obtained in various obliquities for documentation. The patient tolerated procedure well without immediate post procedural complication. FINDINGS: After successful fluoroscopic guided placement, the gastrostomy tube is appropriately positioned with internal retention balloon against the ventral aspect of the gastric lumen.  IMPRESSION: Successful fluoroscopic insertion of a 16 Fr balloon retention gastrostomy tube. The gastrostomy may be used immediately for medication administration and in 4 hrs for the initiation of feeds. RECOMMENDATIONS: The patient will return to Vascular Interventional Radiology (VIR) for routine feeding tube evaluation and exchange in 6 months. Roanna Banning, MD [facility] Electronically Signed   By: Roanna Banning M.D.   On: 05/07/2023 17:07   IR IMAGING GUIDED PORT INSERTION Result Date: 05/07/2023 INDICATION: Chemotherapy for head and neck cancer EXAM: IMPLANTED PORT A CATH PLACEMENT WITH ULTRASOUND AND FLUOROSCOPIC GUIDANCE MEDICATIONS: 25 mg Benadryl IV ANESTHESIA/SEDATION: Moderate (conscious) sedation was employed during this procedure. A total of Versed 1 mg and Fentanyl 25 mcg was administered intravenously. Moderate Sedation Time: 24 minutes. The patient's level of consciousness and vital signs were monitored continuously by radiology nursing throughout the procedure under my direct supervision. FLUOROSCOPY TIME:  Fluoroscopic dose; 2.5 mGy COMPLICATIONS: None immediate. PROCEDURE: The procedure, risks, benefits, and alternatives were explained to the patient. Questions regarding the procedure were encouraged and answered. The patient understands and consents to the procedure. The RIGHT neck and chest were prepped with chlorhexidine in a sterile fashion, and a sterile drape was applied covering the operative field. Maximum barrier sterile technique with sterile gowns and gloves were used for the procedure. A timeout was performed prior to the initiation of the procedure. Local anesthesia was provided with 1% lidocaine with epinephrine. After creating a small venotomy incision, a micropuncture kit was utilized to access the internal jugular vein under direct, real-time ultrasound guidance. Ultrasound image documentation was performed. The microwire was kinked to measure appropriate catheter length. A subcutaneous port pocket was then created along the upper chest wall utilizing a combination of sharp and blunt dissection. The pocket was irrigated with sterile saline. A single lumen power injectable port was chosen for placement. The 8 Fr catheter was tunneled from the port pocket site to the  venotomy incision. The port was placed in the pocket. The external catheter was trimmed to appropriate length. At the venotomy, an 8 Fr peel-away sheath was placed over a guidewire under fluoroscopic guidance. The catheter was then placed through the sheath and the sheath was removed. Final catheter positioning was confirmed and documented with a fluoroscopic spot radiograph. The port was accessed with a Huber needle, aspirated and flushed with heparinized saline. The port pocket incision was closed with interrupted 3-0 Vicryl suture then Dermabond was applied, including at the venotomy incision. Dressings were placed. The patient tolerated the procedure well without immediate post procedural complication. IMPRESSION: Successful placement of a RIGHT internal jugular approach power injectable Port-A-Cath. The tip of the catheter is positioned within the proximal RIGHT atrium. The catheter is ready for immediate use. Roanna Banning, MD [facility] Electronically Signed   By: Roanna Banning M.D.   On: 05/07/2023 17:02    CODE STATUS:  Code Status History     Date Active Date Inactive Code Status Order ID Comments User Context   05/07/2023 1642 05/08/2023 0512 Full Code [id number]  Roanna Banning, MD [facility]   05/07/2023 1642 05/07/2023 1642 Full Code [id number]  Roanna Banning, MD [facility]    Questions for Most Recent Historical Code Status (Order [id number])     Question Answer   By: Consent: discussion documented in EHR            No orders of the defined types were placed in this encounter.  Future Appointments  Date Time Provider Department [place]  06/03/2023  4:15 PM [facility] [id number] [facility] None  06/04/2023  8:15 AM [facility] [id number] [facility] None  06/04/2023  9:00 AM [facility] INFUSION [facility] None  06/04/2023 10:30 AM Noreene Larsson, RD [facility] None  06/07/2023  4:15 PM [facility] [id number] [facility] None  06/07/2023   4:35 PM LINAC-SQUIRE [facility] None  06/08/2023  4:15 PM [facility] [id number] [facility] None  06/08/2023  4:30 PM LINAC-SQUIRE [facility] None  06/09/2023  4:15 PM [facility] [id number] [facility] None  06/10/2023 10:45 AM [facility] [id number] [facility] None  06/10/2023 11:15 AM [facility] FLUSH [facility] None  06/10/2023 11:45 AM Isair Inabinet, MD [facility] None  06/11/2023  8:15 AM [facility] LINAC 3 [facility] None  06/11/2023  9:00 AM [facility] INFUSION [facility] None  06/11/2023  9:45 AM Noreene Larsson, RD [facility] None  06/14/2023  4:15 PM [facility] [id number] [facility] None  06/15/2023  4:15 PM [facility] [id number] [facility] None  06/16/2023  4:15 PM [facility] [id number] [facility] None  06/17/2023  2:45 PM [facility] FLUSH [facility] None  06/17/2023  3:15 PM Jourdyn Hasler, MD [facility] None  06/17/2023  4:15 PM [facility] [id number] [facility] None  06/18/2023 10:00 AM [facility] INFUSION [facility] None  06/18/2023  4:15 PM [facility] [id number] [facility] None  06/21/2023  4:15 PM [facility] [id number] [facility] None  06/22/2023  4:00 PM [facility] LINAC 4 [facility] None  06/23/2023  4:15 PM [facility] [id number] [facility] None  06/24/2023  3:15 PM [facility] FLUSH [facility] None  06/24/2023  3:45 PM Elleanna Melling, MD [facility] None  06/24/2023  4:15 PM [facility] [id number] [facility] None  06/25/2023 10:00 AM [facility] INFUSION [facility] None  06/25/2023  4:15 PM [facility] [id number] [facility] None  07/02/2023 10:15 AM Schinke, Karie Georges, CCC-SLP [id number]      This document was completed utilizing speech recognition software. Grammatical errors, random word insertions, pronoun errors, and incomplete sentences are an occasional consequence of this system due to software limitations, ambient noise, and hardware issues. Any formal questions or concerns about the content, text or information contained within the body of this dictation should  be directly addressed to the provider for clarification.

## 2023-06-02 NOTE — Assessment & Plan Note (Addendum)
 Ultrasound of the right lower extremity on 05/20/2023 showed no evidence of DVT.   He did have signs of cellulitis in the right foot.  Started him on empiric antibiotic course with Bactrim DS tablet twice daily for 10 days.  Cellulitis is now resolved.   - Advise alternating heat and ice application to the affected area - Advise elevating the foot and avoiding tight footwear

## 2023-06-03 ENCOUNTER — Ambulatory Visit
Admission: RE | Admit: 2023-06-03 | Discharge: 2023-06-03 | Disposition: A | Discharge status: Home / Self Care | Payer: No Typology Code available for payment source | Source: Ambulatory Visit | Attending: Radiation Oncology | Admitting: Radiation Oncology

## 2023-06-03 ENCOUNTER — Other Ambulatory Visit: Payer: Self-pay

## 2023-06-03 DIAGNOSIS — Z5111 Encounter for antineoplastic chemotherapy: Secondary | ICD-10-CM | POA: Diagnosis not present

## 2023-06-03 LAB — RAD ONC ARIA SESSION SUMMARY
Course Elapsed Days: 24
Plan Fractions Treated to Date: 19
Plan Prescribed Dose Per Fraction: 2 Gy
Plan Total Fractions Prescribed: 35
Plan Total Prescribed Dose: 70 Gy
Reference Point Dosage Given to Date: 38 Gy
Reference Point Session Dosage Given: 2 Gy
Session Number: 19

## 2023-06-03 MED FILL — Fosaprepitant Dimeglumine For IV Infusion 150 MG (Base Eq): INTRAVENOUS | Qty: 5 | Status: AC

## 2023-06-04 ENCOUNTER — Inpatient Hospital Stay: Payer: No Typology Code available for payment source | Admitting: Dietician

## 2023-06-04 ENCOUNTER — Other Ambulatory Visit: Payer: Self-pay

## 2023-06-04 ENCOUNTER — Inpatient Hospital Stay: Payer: No Typology Code available for payment source

## 2023-06-04 ENCOUNTER — Ambulatory Visit
Admission: RE | Admit: 2023-06-04 | Discharge: 2023-06-04 | Disposition: A | Discharge status: Home / Self Care | Payer: No Typology Code available for payment source | Source: Ambulatory Visit | Attending: Radiation Oncology | Admitting: Radiation Oncology

## 2023-06-04 VITALS — BP 140/78 | HR 56 | Temp 98.6°F | Resp 16 | Wt 198.5 lb

## 2023-06-04 DIAGNOSIS — Z5111 Encounter for antineoplastic chemotherapy: Secondary | ICD-10-CM | POA: Diagnosis not present

## 2023-06-04 DIAGNOSIS — C024 Malignant neoplasm of lingual tonsil: Secondary | ICD-10-CM

## 2023-06-04 LAB — RAD ONC ARIA SESSION SUMMARY
Course Elapsed Days: 25
Plan Fractions Treated to Date: 20
Plan Prescribed Dose Per Fraction: 2 Gy
Plan Total Fractions Prescribed: 35
Plan Total Prescribed Dose: 70 Gy
Reference Point Dosage Given to Date: 40 Gy
Reference Point Session Dosage Given: 2 Gy
Session Number: 20

## 2023-06-04 MED ORDER — DEXAMETHASONE SODIUM PHOSPHATE 10 MG/ML IJ SOLN
10.0000 mg | Freq: Once | INTRAMUSCULAR | Status: AC
  Administered 2023-06-04: 10 mg via INTRAVENOUS
  Filled 2023-06-04: qty 1

## 2023-06-04 MED ORDER — PALONOSETRON HCL INJECTION 0.25 MG/5ML
0.2500 mg | Freq: Once | INTRAVENOUS | Status: AC
Start: 2023-06-04 — End: 2023-06-04
  Administered 2023-06-04: 0.25 mg via INTRAVENOUS
  Filled 2023-06-04: qty 5

## 2023-06-04 MED ORDER — POTASSIUM CHLORIDE IN NACL 20-0.9 MEQ/L-% IV SOLN
Freq: Once | INTRAVENOUS | Status: AC
  Filled 2023-06-04: qty 1000

## 2023-06-04 MED ORDER — MAGNESIUM SULFATE 2 GM/50ML IV SOLN
2.0000 g | Freq: Once | INTRAVENOUS | Status: AC
  Administered 2023-06-04: 2 g via INTRAVENOUS
  Filled 2023-06-04: qty 50

## 2023-06-04 MED ORDER — SODIUM CHLORIDE 0.9 % IV SOLN
150.0000 mg | Freq: Once | INTRAVENOUS | Status: AC
  Administered 2023-06-04: 150 mg via INTRAVENOUS
  Filled 2023-06-04: qty 150

## 2023-06-04 MED ORDER — SODIUM CHLORIDE 0.9 % IV SOLN: INTRAVENOUS | Status: DC

## 2023-06-04 MED ORDER — SODIUM CHLORIDE 0.9 % IV SOLN
40.0000 mg/m2 | Freq: Once | INTRAVENOUS | Status: AC
  Administered 2023-06-04: 88 mg via INTRAVENOUS
  Filled 2023-06-04: qty 88

## 2023-06-04 NOTE — Progress Notes (Signed)
 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 He is receiving concurrent chemoradiation (RT start 2/3, chemo 2/7). Patient is under the care of Dr. Basilio Cairo and Dr. Arlana Pouch.    S/p PEG 1/31  Met with patient and wife in infusion. Patient in good spirits as usual. He reports decreased intake this week secondary to taste changes, thick saliva, sore throat. Recalls high protein chicken noodle soup and cheese quesadilla with sour cream. Patient reports water has been more challenging to drink. Not meeting 64 ounces in the last couple of days. Patient endorses a couple episodes of nausea without vomiting. He did use antiemetics which worked well. Patient reports mild constipation. Bowels moving every 3 days. He does not want to start bowel regimen at this time. He continues working and going to [employer] a few times/week.    Medications: reviewed  Labs: reviewed  Anthropometrics: Wt 198 lb 8 oz today - decreased 3% in 7 days - this is severe for time frame  2/21 - 205 lb 4 oz 2/14 - 206 lb 12 oz  2/7 - 206 lb 8 oz   Estimated Energy Needs  Kcals: 2700-3000 Protein: 122-140 Fluid: >2.7 L  NUTRITION DIAGNOSIS: Unintended wt loss continues    INTERVENTION:  Reinforced importance of meeting nutrition goals to minimize wt loss  Pt agreeable to begin supplementing with TF - politely declines bolus feeding today Recommend starting with one carton Osmolite 2/day in between meals. Increase as needed with decline in po Encourage drinking high calorie high protein shakes as tolerated - additional KF 1.4 samples provided  Continue baking soda salt water rinses. Suggested cool mist humidifier overnight for dry mouth     MONITORING, EVALUATION, GOAL: wt trends, intake, TF   NEXT VISIT: Friday March 7 during infusion

## 2023-06-07 ENCOUNTER — Other Ambulatory Visit: Payer: Self-pay

## 2023-06-07 ENCOUNTER — Ambulatory Visit: Payer: No Typology Code available for payment source

## 2023-06-07 ENCOUNTER — Ambulatory Visit
Admission: RE | Admit: 2023-06-07 | Discharge: 2023-06-07 | Disposition: A | Discharge status: Home / Self Care | Payer: No Typology Code available for payment source | Source: Ambulatory Visit | Attending: Radiation Oncology

## 2023-06-07 DIAGNOSIS — Z7952 Long term (current) use of systemic steroids: Secondary | ICD-10-CM | POA: Diagnosis not present

## 2023-06-07 DIAGNOSIS — M10072 Idiopathic gout, left ankle and foot: Secondary | ICD-10-CM | POA: Diagnosis not present

## 2023-06-07 DIAGNOSIS — C024 Malignant neoplasm of lingual tonsil: Secondary | ICD-10-CM | POA: Insufficient documentation

## 2023-06-07 DIAGNOSIS — Z79899 Other long term (current) drug therapy: Secondary | ICD-10-CM | POA: Diagnosis not present

## 2023-06-07 DIAGNOSIS — D701 Agranulocytosis secondary to cancer chemotherapy: Secondary | ICD-10-CM | POA: Diagnosis not present

## 2023-06-07 DIAGNOSIS — T451X5A Adverse effect of antineoplastic and immunosuppressive drugs, initial encounter: Secondary | ICD-10-CM | POA: Diagnosis not present

## 2023-06-07 DIAGNOSIS — Z923 Personal history of irradiation: Secondary | ICD-10-CM | POA: Diagnosis not present

## 2023-06-07 DIAGNOSIS — R682 Dry mouth, unspecified: Secondary | ICD-10-CM | POA: Diagnosis not present

## 2023-06-07 DIAGNOSIS — D6959 Other secondary thrombocytopenia: Secondary | ICD-10-CM | POA: Diagnosis not present

## 2023-06-07 DIAGNOSIS — R112 Nausea with vomiting, unspecified: Secondary | ICD-10-CM | POA: Diagnosis not present

## 2023-06-07 DIAGNOSIS — Z5111 Encounter for antineoplastic chemotherapy: Secondary | ICD-10-CM | POA: Diagnosis present

## 2023-06-07 DIAGNOSIS — Z79630 Long term (current) use of alkylating agent: Secondary | ICD-10-CM | POA: Diagnosis not present

## 2023-06-07 DIAGNOSIS — M10071 Idiopathic gout, right ankle and foot: Secondary | ICD-10-CM | POA: Diagnosis not present

## 2023-06-07 DIAGNOSIS — R110 Nausea: Secondary | ICD-10-CM | POA: Diagnosis not present

## 2023-06-07 DIAGNOSIS — R232 Flushing: Secondary | ICD-10-CM | POA: Diagnosis not present

## 2023-06-07 LAB — RAD ONC ARIA SESSION SUMMARY
Course Elapsed Days: 28
Plan Fractions Treated to Date: 21
Plan Prescribed Dose Per Fraction: 2 Gy
Plan Total Fractions Prescribed: 35
Plan Total Prescribed Dose: 70 Gy
Reference Point Dosage Given to Date: 42 Gy
Reference Point Session Dosage Given: 2 Gy
Session Number: 21

## 2023-06-08 ENCOUNTER — Ambulatory Visit
Admission: RE | Admit: 2023-06-08 | Discharge: 2023-06-08 | Disposition: A | Discharge status: Home / Self Care | Payer: No Typology Code available for payment source | Source: Ambulatory Visit | Attending: Radiation Oncology

## 2023-06-08 ENCOUNTER — Other Ambulatory Visit: Payer: Self-pay

## 2023-06-08 ENCOUNTER — Ambulatory Visit: Payer: No Typology Code available for payment source

## 2023-06-08 DIAGNOSIS — Z5111 Encounter for antineoplastic chemotherapy: Secondary | ICD-10-CM | POA: Diagnosis not present

## 2023-06-08 LAB — RAD ONC ARIA SESSION SUMMARY
Course Elapsed Days: 29
Plan Fractions Treated to Date: 22
Plan Prescribed Dose Per Fraction: 2 Gy
Plan Total Fractions Prescribed: 35
Plan Total Prescribed Dose: 70 Gy
Reference Point Dosage Given to Date: 44 Gy
Reference Point Session Dosage Given: 2 Gy
Session Number: 22

## 2023-06-09 ENCOUNTER — Ambulatory Visit
Admission: RE | Admit: 2023-06-09 | Discharge: 2023-06-09 | Disposition: A | Discharge status: Home / Self Care | Payer: No Typology Code available for payment source | Source: Ambulatory Visit | Attending: Radiation Oncology | Admitting: Radiation Oncology

## 2023-06-09 ENCOUNTER — Other Ambulatory Visit: Payer: Self-pay

## 2023-06-09 DIAGNOSIS — Z5111 Encounter for antineoplastic chemotherapy: Secondary | ICD-10-CM | POA: Diagnosis not present

## 2023-06-09 LAB — RAD ONC ARIA SESSION SUMMARY
Course Elapsed Days: 30
Plan Fractions Treated to Date: 23
Plan Prescribed Dose Per Fraction: 2 Gy
Plan Total Fractions Prescribed: 35
Plan Total Prescribed Dose: 70 Gy
Reference Point Dosage Given to Date: 46 Gy
Reference Point Session Dosage Given: 2 Gy
Session Number: 23

## 2023-06-10 ENCOUNTER — Ambulatory Visit
Admission: RE | Admit: 2023-06-10 | Discharge: 2023-06-10 | Disposition: A | Discharge status: Home / Self Care | Payer: No Typology Code available for payment source | Source: Ambulatory Visit | Attending: Radiation Oncology | Admitting: Radiation Oncology

## 2023-06-10 ENCOUNTER — Other Ambulatory Visit: Payer: No Typology Code available for payment source

## 2023-06-10 ENCOUNTER — Inpatient Hospital Stay: Payer: No Typology Code available for payment source

## 2023-06-10 ENCOUNTER — Inpatient Hospital Stay: Payer: No Typology Code available for payment source | Attending: Oncology | Admitting: Oncology

## 2023-06-10 ENCOUNTER — Other Ambulatory Visit: Payer: Self-pay

## 2023-06-10 ENCOUNTER — Encounter: Payer: Self-pay | Admitting: Oncology

## 2023-06-10 VITALS — BP 129/68 | HR 60 | Temp 97.8°F | Resp 16 | Wt 196.5 lb

## 2023-06-10 DIAGNOSIS — R110 Nausea: Secondary | ICD-10-CM | POA: Insufficient documentation

## 2023-06-10 DIAGNOSIS — Z7952 Long term (current) use of systemic steroids: Secondary | ICD-10-CM | POA: Insufficient documentation

## 2023-06-10 DIAGNOSIS — C024 Malignant neoplasm of lingual tonsil: Secondary | ICD-10-CM | POA: Insufficient documentation

## 2023-06-10 DIAGNOSIS — D701 Agranulocytosis secondary to cancer chemotherapy: Secondary | ICD-10-CM | POA: Diagnosis not present

## 2023-06-10 DIAGNOSIS — D6959 Other secondary thrombocytopenia: Secondary | ICD-10-CM | POA: Diagnosis not present

## 2023-06-10 DIAGNOSIS — Z79630 Long term (current) use of alkylating agent: Secondary | ICD-10-CM | POA: Insufficient documentation

## 2023-06-10 DIAGNOSIS — Z79899 Other long term (current) drug therapy: Secondary | ICD-10-CM | POA: Insufficient documentation

## 2023-06-10 DIAGNOSIS — R112 Nausea with vomiting, unspecified: Secondary | ICD-10-CM | POA: Insufficient documentation

## 2023-06-10 DIAGNOSIS — M10071 Idiopathic gout, right ankle and foot: Secondary | ICD-10-CM | POA: Insufficient documentation

## 2023-06-10 DIAGNOSIS — R232 Flushing: Secondary | ICD-10-CM | POA: Insufficient documentation

## 2023-06-10 DIAGNOSIS — Z923 Personal history of irradiation: Secondary | ICD-10-CM | POA: Insufficient documentation

## 2023-06-10 DIAGNOSIS — R682 Dry mouth, unspecified: Secondary | ICD-10-CM | POA: Insufficient documentation

## 2023-06-10 DIAGNOSIS — Z5111 Encounter for antineoplastic chemotherapy: Secondary | ICD-10-CM | POA: Insufficient documentation

## 2023-06-10 DIAGNOSIS — T451X5A Adverse effect of antineoplastic and immunosuppressive drugs, initial encounter: Secondary | ICD-10-CM | POA: Insufficient documentation

## 2023-06-10 DIAGNOSIS — Z95828 Presence of other vascular implants and grafts: Secondary | ICD-10-CM

## 2023-06-10 DIAGNOSIS — M10072 Idiopathic gout, left ankle and foot: Secondary | ICD-10-CM | POA: Insufficient documentation

## 2023-06-10 LAB — CBC WITH DIFFERENTIAL (CANCER CENTER ONLY)
Abs Immature Granulocytes: 0 10*3/uL (ref 0.00–0.07)
Basophils Absolute: 0 10*3/uL (ref 0.0–0.1)
Basophils Relative: 1 %
Eosinophils Absolute: 0 10*3/uL (ref 0.0–0.5)
Eosinophils Relative: 0 %
HCT: 35.1 % — ABNORMAL LOW (ref 39.0–52.0)
Hemoglobin: 12.1 g/dL — ABNORMAL LOW (ref 13.0–17.0)
Immature Granulocytes: 0 %
Lymphocytes Relative: 12 %
Lymphs Abs: 0.3 10*3/uL — ABNORMAL LOW (ref 0.7–4.0)
MCH: 30.8 pg (ref 26.0–34.0)
MCHC: 34.5 g/dL (ref 30.0–36.0)
MCV: 89.3 fL (ref 80.0–100.0)
Monocytes Absolute: 0.3 10*3/uL (ref 0.1–1.0)
Monocytes Relative: 12 %
Neutro Abs: 1.9 10*3/uL (ref 1.7–7.7)
Neutrophils Relative %: 75 %
Platelet Count: 111 10*3/uL — ABNORMAL LOW (ref 150–400)
RBC: 3.93 MIL/uL — ABNORMAL LOW (ref 4.22–5.81)
RDW: 11.6 % (ref 11.5–15.5)
WBC Count: 2.5 10*3/uL — ABNORMAL LOW (ref 4.0–10.5)
nRBC: 0 % (ref 0.0–0.2)

## 2023-06-10 LAB — BASIC METABOLIC PANEL - CANCER CENTER ONLY
Anion gap: 6 (ref 5–15)
BUN: 15 mg/dL (ref 8–23)
CO2: 27 mmol/L (ref 22–32)
Calcium: 9.1 mg/dL (ref 8.9–10.3)
Chloride: 104 mmol/L (ref 98–111)
Creatinine: 0.79 mg/dL (ref 0.61–1.24)
GFR, Estimated: >60 mL/min (ref >60)
Glucose, Bld: 94 mg/dL (ref 70–99)
Potassium: 4.1 mmol/L (ref 3.5–5.1)
Sodium: 137 mmol/L (ref 135–145)

## 2023-06-10 LAB — RAD ONC ARIA SESSION SUMMARY
Course Elapsed Days: 31
Plan Fractions Treated to Date: 24
Plan Prescribed Dose Per Fraction: 2 Gy
Plan Total Fractions Prescribed: 35
Plan Total Prescribed Dose: 70 Gy
Reference Point Dosage Given to Date: 48 Gy
Reference Point Session Dosage Given: 2 Gy
Session Number: 24

## 2023-06-10 LAB — MAGNESIUM: Magnesium: 1.7 mg/dL (ref 1.7–2.4)

## 2023-06-10 MED ORDER — HEPARIN SOD (PORK) LOCK FLUSH 100 UNIT/ML IV SOLN
500.0000 [IU] | Freq: Once | INTRAVENOUS | Status: AC
Start: 2023-06-10 — End: 2023-06-10
  Administered 2023-06-10: 500 [IU]

## 2023-06-10 MED ORDER — SODIUM CHLORIDE 0.9% FLUSH
10.0000 mL | Freq: Once | INTRAVENOUS | Status: AC
  Administered 2023-06-10: 10 mL

## 2023-06-10 MED FILL — Fosaprepitant Dimeglumine For IV Infusion 150 MG (Base Eq): INTRAVENOUS | Qty: 5 | Status: AC

## 2023-06-10 NOTE — Progress Notes (Signed)
 [facility]  ONCOLOGY CLINIC PROGRESS NOTE   Patient Care Team: Caffie Damme, MD as PCP - General (Family Medicine) Lonie Peak, MD as Attending Physician (Radiation Oncology) Serena Colonel, MD as Consulting Physician (Otolaryngology) Malmfelt, Lise Auer, RN as Oncology Nurse Navigator Meryl Crutch, MD as Consulting Physician (Oncology)  PATIENT NAME: Randy Kaufman   MR#: [id number] DOB: 03-24-1961  Date of visit: 06/10/2023   ASSESSMENT & PLAN:   KIRBY CORTESE is a 63 y.o.  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 Unknown p16 status.   Lingual tonsil carcinoma (HCC) - Please review oncology history for additional details and timeline of events.  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 Unknown p16 status.  Clinically cT2,cN1,cM0.  There is concern for extracapsular extension based on imaging.  Discussed his case in tumor conference on 04/21/2023. Given the size and nature of the lymph node with concern for extracapsular extension, he has a high likelihood of requiring adjuvant chemoradiation if removed surgically.  Hence consensus opinion is to proceed with concurrent chemoradiation.  It was decided to not pursue another biopsy in an attempt to determine p16 status, since the treatment plan would not change.  -Previously I discussed tumor board recommendations with the patient.  He was agreeable to proceeding with concurrent chemoradiation. We have discussed about role of cisplatin being a radiosensitizer in the treatment of head and neck cancer.  We have discussed about the curative intent of chemoradiation for this patient. Patient was willing to proceed with weekly cisplatin.   Cisplatin will be given at 40 mg/m IV dose weekly during the course of radiation.  He started radiation from 05/10/2023.  Started chemotherapy with cisplatin from 05/14/2023.  Has been tolerating treatments well without any major side effects.  -Labs today revealed leukopenia with white count of 2500, ANC of 1900.  Thrombocytopenia with platelet count of 111,000.  We will dose reduce cisplatin with cycle 5 to 30 mg/m and also start him on Zarxio 300 mcg daily for 2 days next week.  Will proceed with cycle 5 of cisplatin on 06/11/2023 as scheduled with dose reductions as mentioned.  Patient has nausea medications available at home.  He has established with dietitian for supportive care.  Plan is to continue cisplatin weekly during the course of radiation.   Leukopenia due to antineoplastic chemotherapy (HCC) White count 2500 today but ANC is 1900.  We can proceed with cycle 5 of cisplatin tomorrow but we will dose reduce to 30 mg/m and also give him Zarxio 300 mcg daily for 2 days next week.  Chemotherapy-induced thrombocytopenia Dose reduced cisplatin as mentioned above.  Will hold chemotherapy if platelet count goes below 50,000.    RTC in 1 week for labs, follow-up and continuation of chemotherapy.  I reviewed lab results and outside records for this visit and discussed relevant results with the patient. Diagnosis, plan of care and treatment options were also discussed in detail with the patient. Opportunity provided to ask questions and answers provided to his apparent satisfaction. Provided instructions to call our clinic with any problems, questions or concerns prior to return visit. I recommended to continue follow-up with PCP and sub-specialists. He verbalized understanding and agreed with the plan.   NCCN guidelines have been consulted in the  planning of this patient's care.  I spent a total of 30 minutes during this encounter with the patient including review of chart and various  tests results, discussions about plan of care and coordination of care plan.   Meryl Crutch, MD  06/10/2023 1:05 PM  [facility] - A DEPT OF [facility] [address] [place] [address] Dept: [phone] Dept Fax: [phone]    CHIEF COMPLAINT/ REASON FOR VISIT:   Squamous cell carcinoma consistent with a right lingual tonsillar primary with a metastatic ipsilateral cervical node. Clinically cT2,cN1,cM0.   Current Treatment: Concurrent chemoradiation with weekly cisplatin started during the week of 05/10/2023.  INTERVAL HISTORY:    Discussed the use of AI scribe software for clinical note transcription with the patient, who gave verbal consent to proceed.   [name] is here today for repeat clinical assessment.   He reports progressively worsening sore throat. He has been managing the discomfort with medication, but plans to increase the dosage this week. Despite the sore throat, he is still able to consume food and is considering supplementing his diet to maintain his weight.  In addition to his cancer treatment, the patient is also working from home and taking precautions to protect his health, such as wearing a mask when outside. He has been maintaining hydration, but is not reaching his target of sixty-four ounces of water consumption per day. Despite this, his kidney numbers and electrolytes have remained stable.  I have reviewed the past medical history, past surgical history, social history and family history with the patient and they are unchanged from previous note.  HISTORY OF PRESENT ILLNESS:   ONCOLOGY HISTORY:   He presented to his dentist last year with c/o of a small lump to his right neck. He was subsequently referred to Dr. Pollyann Kennedy at [facility], on 04/02/23 for further evaluation. Physical exam performed at that time noted a palpable 4 cm right level 2 neck mass. No other masses were  palpated.    A biopsy of the right neck mass was collected that date (04/02/23) and showed findings consistent with metastatic squamous cell carcinoma.  There was insufficient tissue to check for p16 status.   Staging PET scan on 04/14/22 revealed hypermetabolic activity at the right lingual tonsil and right level II node. The enlarged lymph node appeared to measure approximately 2.6 cm in greatest dimension. No evidence of distant metastatic disease was seen.    He later had CT soft tissue neck at [facility]   Unknown p16 status.  Clinically cT2,cN1,cM0.  There is concern for extracapsular extension based on imaging.   Discussed his case in tumor conference on 04/21/2023. Given the size and nature of the lymph node with concern for extracapsular extension, he has a high likelihood of requiring adjuvant chemoradiation if removed surgically.  Hence consensus opinion was to proceed with concurrent chemoradiation. It was decided to not pursue another biopsy in an attempt to determine p16 status, since the treatment plan would not change.  He started radiation treatments from 05/10/2023.  Started chemotherapy with cisplatin from 05/14/2023.  Oncology History  Lingual tonsil carcinoma (HCC)  04/20/2023 Initial Diagnosis   Lingual tonsil carcinoma (HCC)   04/22/2023 Cancer Staging   Staging form: Pharynx - HPV-Mediated Oropharynx, AJCC 8th Edition - Clinical: Stage I (cT2, cN1, cM0, p16: Unknown, HPV: Unknown) - Signed by Meryl Crutch, MD on 04/22/2023   05/14/2023 -  Chemotherapy   Patient is on Treatment Plan : HEAD/NECK Cisplatin (40) q7d         REVIEW OF SYSTEMS:   Review of Systems - Oncology  All other pertinent systems were reviewed with the patient and are negative.  ALLERGIES: He has no known allergies.  MEDICATIONS:  Current Outpatient Medications  Medication Sig Dispense Refill   dexamethasone (DECADRON) 4 MG tablet Take 2 tablets (8 mg) by mouth daily x 3 days  starting the day after cisplatin chemotherapy. Take with food. 30 tablet 1   finasteride (PROSCAR) 5 MG tablet Take 5 mg by mouth daily. Takes 1/2 Tab to equal 2.5 mg daily     lidocaine-prilocaine (EMLA) cream Apply to affected area once 30 g 3   ondansetron (ZOFRAN) 8 MG tablet Take 1 tablet (8 mg total) by mouth every 8 (eight) hours as needed for nausea or vomiting. Start on the third day after cisplatin. 30 tablet 1   Biotin 1000 MCG tablet Take 1,000 mcg by mouth 3 (three) times daily as needed (Dry mouth). (Patient not taking: Reported on 06/10/2023)     lidocaine (XYLOCAINE) 2 % solution Patient: Mix 1part 2% viscous lidocaine, 1part H20. Swallow 10mL of diluted mixture, before meals and at bedtime, up to QID (Patient not taking: Reported on 06/10/2023) 200 mL 3   prochlorperazine (COMPAZINE) 10 MG tablet Take 1 tablet (10 mg total) by mouth every 6 (six) hours as needed (Nausea or vomiting). (Patient not taking: Reported on 06/10/2023) 30 tablet 1   tadalafil (CIALIS) 20 MG tablet Take 20 mg by mouth daily as needed. (Patient not taking: Reported on 06/10/2023)     traMADol (ULTRAM) 50 MG tablet Take 1 tablet (50 mg total) by mouth every 6 (six) hours as needed. (Patient not taking: Reported on 06/10/2023) 60 tablet 0   No current facility-administered medications for this visit.     VITALS:   Blood pressure 129/68, pulse 60, temperature 97.8 F (36.6 C), temperature source Temporal, resp. rate 16, weight 196 lb 8 oz (89.1 kg), SpO2 100%.  Wt Readings from Last 3 Encounters:  06/10/23 196 lb 8 oz (89.1 kg)  06/04/23 198 lb 8 oz (90 kg)  06/02/23 200 lb 6.4 oz (90.9 kg)    Body mass index is 26.65 kg/m.    Onc Performance Status - 06/10/23 1152       ECOG Perf Status   ECOG Perf Status Fully active, able to carry on all pre-disease performance without restriction      KPS SCALE   KPS % SCORE Normal, no compliants, no evidence of disease              PHYSICAL EXAM:    Physical Exam Constitutional:      General: He is not in acute distress.    Appearance: Normal appearance.  HENT:     Head: Normocephalic and atraumatic.     Mouth/Throat:     Comments: Slight erythema in the back of the throat Eyes:     General: No scleral icterus.    Conjunctiva/sclera: Conjunctivae normal.  Cardiovascular:     Rate and Rhythm: Normal rate and regular rhythm.     Pulses: Normal pulses.     Heart sounds: Normal heart sounds.  Pulmonary:     Effort: Pulmonary effort is normal.     Breath sounds: Normal breath sounds.  Chest:     Comments: Right-sided Port-A-Cath in place, without any evidence of infection Abdominal:  General: There is no distension.     Comments: Feeding tube in place.  No signs of infection.  Musculoskeletal:     Right lower leg: No edema.     Left lower leg: No edema.  Lymphadenopathy:     Cervical: Cervical adenopathy (firm, fixed, right sided LN, improved compared to prior) present.  Skin:    Comments: Right foot cellulitis has resolved  Neurological:     General: No focal deficit present.     Mental Status: He is alert and oriented to person, place, and time.  Psychiatric:        Mood and Affect: Mood normal.        Behavior: Behavior normal.        Thought Content: Thought content normal.       LABORATORY DATA:   I have reviewed the data as listed.  Results for orders placed or performed in visit on 06/10/23  CBC with Differential (Cancer Center Only)  Result Value Ref Range   WBC Count 2.5 (L) 4.0 - 10.5 K/uL   RBC 3.93 (L) 4.22 - 5.81 MIL/uL   Hemoglobin 12.1 (L) 13.0 - 17.0 g/dL   HCT 16.1 (L) 09.6 - 04.5 %   MCV 89.3 80.0 - 100.0 fL   MCH 30.8 26.0 - 34.0 pg   MCHC 34.5 30.0 - 36.0 g/dL   RDW 40.9 81.1 - 91.4 %   Platelet Count 111 (L) 150 - 400 K/uL   nRBC 0.0 0.0 - 0.2 %   Neutrophils Relative % 75 %   Neutro Abs 1.9 1.7 - 7.7 K/uL   Lymphocytes Relative 12 %   Lymphs Abs 0.3 (L) 0.7 - 4.0 K/uL    Monocytes Relative 12 %   Monocytes Absolute 0.3 0.1 - 1.0 K/uL   Eosinophils Relative 0 %   Eosinophils Absolute 0.0 0.0 - 0.5 K/uL   Basophils Relative 1 %   Basophils Absolute 0.0 0.0 - 0.1 K/uL   Immature Granulocytes 0 %   Abs Immature Granulocytes 0.00 0.00 - 0.07 K/uL  Basic Metabolic Panel - Cancer Center Only  Result Value Ref Range   Sodium 137 135 - 145 mmol/L   Potassium 4.1 3.5 - 5.1 mmol/L   Chloride 104 98 - 111 mmol/L   CO2 27 22 - 32 mmol/L   Glucose, Bld 94 70 - 99 mg/dL   BUN 15 8 - 23 mg/dL   Creatinine 7.82 9.56 - 1.24 mg/dL   Calcium 9.1 8.9 - 21.3 mg/dL   GFR, Estimated >08 >65 mL/min   Anion gap 6 5 - 15  Magnesium  Result Value Ref Range   Magnesium 1.7 1.7 - 2.4 mg/dL  Results for orders placed or performed in visit on 06/10/23  Rad Onc Aria Session Summary  Result Value Ref Range   Course ID [id number]    Course Start Date 04/30/2023    Session Number 24    Course First Treatment Date 05/10/2023  1:00 PM    Course Last Treatment Date 06/10/2023 11:05 AM    Course Elapsed Days 31    Reference Point ID HN dp    Reference Point Dosage Given to Date 48 Gy   Reference Point Session Dosage Given 2 Gy   Plan ID [id number]_Tonsil    Plan Fractions Treated to Date 24    Plan Total Fractions Prescribed 35    Plan Prescribed Dose Per Fraction 2 Gy   Plan Total Prescribed Dose 70.000000 Gy   Plan  Primary Reference Point HN dp      RADIOGRAPHIC STUDIES:  I have personally reviewed the radiological images as listed and agree with the findings in the report.  VAS [place] LOWER EXTREMITY VENOUS (DVT) Result Date: 05/20/2023  Lower Venous DVT Study Patient Name:  COLON RUETH  Date of Exam:   05/20/2023 Medical Rec #: [id number]          Accession #:    [id number] Date of Birth: 09-22-1960           Patient Gender: M Patient Age:   43 years Exam Location:  [facility] Procedure:      VAS [place] LOWER EXTREMITY VENOUS (DVT) Referring Phys: Archie Patten Lizeth Bencosme  --------------------------------------------------------------------------------  Indications: Pain.  Risk Factors: Cancer Carcinoma. Comparison Study: None. Performing Technologist: Shona Simpson  Examination Guidelines: A complete evaluation includes B-mode imaging, spectral Doppler, color Doppler, and power Doppler as needed of all accessible portions of each vessel. Bilateral testing is considered an integral part of a complete examination. Limited examinations for reoccurring indications may be performed as noted. The reflux portion of the exam is performed with the patient in reverse Trendelenburg.  +---------+---------------+---------+-----------+----------+--------------+ RIGHT    CompressibilityPhasicitySpontaneityPropertiesThrombus Aging +---------+---------------+---------+-----------+----------+--------------+ CFV      Full           Yes      Yes                                 +---------+---------------+---------+-----------+----------+--------------+ SFJ      Full                                                        +---------+---------------+---------+-----------+----------+--------------+ FV Prox  Full                                                        +---------+---------------+---------+-----------+----------+--------------+ FV Mid   Full                                                        +---------+---------------+---------+-----------+----------+--------------+ FV DistalFull                                                        +---------+---------------+---------+-----------+----------+--------------+ PFV      Full                                                        +---------+---------------+---------+-----------+----------+--------------+ POP      Full           Yes      Yes                                 +---------+---------------+---------+-----------+----------+--------------+  PTV      Full                                                         +---------+---------------+---------+-----------+----------+--------------+ PERO     Full                                                        +---------+---------------+---------+-----------+----------+--------------+   +----+---------------+---------+-----------+----------+--------------+ LEFTCompressibilityPhasicitySpontaneityPropertiesThrombus Aging +----+---------------+---------+-----------+----------+--------------+ CFV Full           Yes      Yes                                 +----+---------------+---------+-----------+----------+--------------+     Summary: RIGHT: - There is no evidence of deep vein thrombosis in the lower extremity.  - No cystic structure found in the popliteal fossa.  LEFT: - No evidence of common femoral vein obstruction.   *See table(s) above for measurements and observations. Electronically signed by Heath Lark on 05/20/2023 at 4:07:29 PM.    Final     CODE STATUS:  Code Status History     Date Active Date Inactive Code Status Order ID Comments User Context   05/07/2023 1642 05/08/2023 0512 Full Code [id number]  Roanna Banning, MD [facility]   05/07/2023 1642 05/07/2023 1642 Full Code [id number]  Roanna Banning, MD [facility]    Questions for Most Recent Historical Code Status (Order [id number])     Question Answer   By: Consent: discussion documented in EHR            No orders of the defined types were placed in this encounter.    Future Appointments  Date Time Provider Department Center  06/11/2023  8:15 AM [facility] [id number] [facility] None  06/11/2023  9:00 AM [facility] INFUSION [facility] None  06/11/2023  9:45 AM Noreene Larsson, RD [facility] None  06/14/2023  3:15 PM [facility] FLUSH [facility] None  06/14/2023  4:00 PM [facility] [id number] [facility] None  06/15/2023  3:15 PM [facility] FLUSH [facility] None  06/15/2023  4:00 PM [facility] [id number] [facility] None  06/15/2023  4:15 PM LINAC-KINARD  [facility] None  06/16/2023  4:00 PM [facility] [id number] [facility] None  06/17/2023  2:45 PM [facility] FLUSH [facility] None  06/17/2023  3:15 PM Sadrac Zeoli, MD [facility] None  06/17/2023  4:00 PM [facility] [id number] [facility] None  06/18/2023  8:45 AM [facility] [id number] [facility] None  06/18/2023 10:00 AM [facility] INFUSION [facility] None  06/21/2023  4:00 PM [facility] [id number] [facility] None  06/22/2023  4:00 PM [facility] LINAC 4 [facility] None  06/23/2023  3:30 PM [facility] [id number] [facility] None  06/24/2023  3:15 PM [facility] FLUSH [facility] None  06/24/2023  3:45 PM Vanice Rappa, MD [facility] None  06/24/2023  4:15 PM [facility] [id number] [facility] None  06/25/2023  9:15 AM [facility] [id number] [facility] None  06/25/2023 10:00 AM [facility] INFUSION [facility] None  07/02/2023  9:00 AM Tevis, Kara R, PT [facility] None  07/02/2023 10:15 AM Schinke, Karie Georges, CCC-SLP [facility]      This document was completed utilizing speech recognition software. Grammatical errors, random  word insertions, pronoun errors, and incomplete sentences are an occasional consequence of this system due to software limitations, ambient noise, and hardware issues. Any formal questions or concerns about the content, text or information contained within the body of this dictation should be directly addressed to the provider for clarification.

## 2023-06-10 NOTE — Assessment & Plan Note (Addendum)
-   Please review oncology history for additional details and timeline of events.  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 Unknown p16 status.  Clinically cT2,cN1,cM0.  There is concern for extracapsular extension based on imaging.  Discussed his case in tumor conference on 04/21/2023. Given the size and nature of the lymph node with concern for extracapsular extension, he has a high likelihood of requiring adjuvant chemoradiation if removed surgically.  Hence consensus opinion is to proceed with concurrent chemoradiation.  It was decided to not pursue another biopsy in an attempt to determine p16 status, since the treatment plan would not change.  -Previously I discussed tumor board recommendations with the patient.  He was agreeable to proceeding with concurrent chemoradiation. We have discussed about role of cisplatin being a radiosensitizer in the treatment of head and neck cancer.  We have discussed about the curative intent of chemoradiation for this patient. Patient was willing to proceed with weekly cisplatin.  Cisplatin will be given at 40 mg/m IV dose weekly during the course of radiation.  He started radiation from 05/10/2023.  Started chemotherapy with cisplatin from 05/14/2023.  Has been tolerating treatments well without any major side effects.  -Labs today revealed leukopenia with white count of 2500, ANC of 1900.  Thrombocytopenia with platelet count of 111,000.  We will dose reduce cisplatin with cycle 5 to 30 mg/m and also start him on Zarxio 300 mcg daily for 2 days next week.  Will proceed with cycle 5 of cisplatin on 06/11/2023 as scheduled with dose reductions as mentioned.  Patient has nausea medications available at home.  He has established with dietitian for supportive care.  Plan is to continue cisplatin weekly during the course of radiation.

## 2023-06-10 NOTE — Assessment & Plan Note (Signed)
 Dose reduced cisplatin as mentioned above.  Will hold chemotherapy if platelet count goes below 50,000.

## 2023-06-10 NOTE — Assessment & Plan Note (Signed)
 White count 2500 today but ANC is 1900.  We can proceed with cycle 5 of cisplatin tomorrow but we will dose reduce to 30 mg/m and also give him Zarxio 300 mcg daily for 2 days next week.

## 2023-06-10 NOTE — Progress Notes (Signed)
 Patient seen by Dr. Archie Patten Pasam today  Vitals are within treatment parameters:Yes   Labs are within treatment parameters: Yes   Treatment plan has been signed: Yes   Per physician team, Patient is ready for treatment. Please note the following modifications:  New, to have chemo reduced and two injections added.

## 2023-06-11 ENCOUNTER — Other Ambulatory Visit: Payer: Self-pay | Admitting: Oncology

## 2023-06-11 ENCOUNTER — Inpatient Hospital Stay: Payer: No Typology Code available for payment source | Admitting: Dietician

## 2023-06-11 ENCOUNTER — Other Ambulatory Visit: Payer: Self-pay

## 2023-06-11 ENCOUNTER — Ambulatory Visit
Admission: RE | Admit: 2023-06-11 | Discharge: 2023-06-11 | Disposition: A | Discharge status: Home / Self Care | Payer: No Typology Code available for payment source | Source: Ambulatory Visit | Attending: Radiation Oncology | Admitting: Radiation Oncology

## 2023-06-11 ENCOUNTER — Inpatient Hospital Stay: Payer: No Typology Code available for payment source

## 2023-06-11 VITALS — BP 123/71 | HR 57 | Temp 98.0°F | Resp 16 | Wt 195.0 lb

## 2023-06-11 DIAGNOSIS — C024 Malignant neoplasm of lingual tonsil: Secondary | ICD-10-CM

## 2023-06-11 DIAGNOSIS — Z5111 Encounter for antineoplastic chemotherapy: Secondary | ICD-10-CM | POA: Diagnosis not present

## 2023-06-11 LAB — RAD ONC ARIA SESSION SUMMARY
Course Elapsed Days: 32
Plan Fractions Treated to Date: 25
Plan Prescribed Dose Per Fraction: 2 Gy
Plan Total Fractions Prescribed: 35
Plan Total Prescribed Dose: 70 Gy
Reference Point Dosage Given to Date: 50 Gy
Reference Point Session Dosage Given: 2 Gy
Session Number: 25

## 2023-06-11 MED ORDER — SODIUM CHLORIDE 0.9% FLUSH
10.0000 mL | INTRAVENOUS | Status: DC | PRN
  Administered 2023-06-11: 10 mL

## 2023-06-11 MED ORDER — PANTOPRAZOLE SODIUM 40 MG PO TBEC: 40.0000 mg | DELAYED_RELEASE_TABLET | Freq: Every day | ORAL | 3 refills | Status: DC

## 2023-06-11 MED ORDER — SODIUM CHLORIDE 0.9 % IV SOLN
INTRAVENOUS | Status: DC
Start: 2023-06-11 — End: 2023-06-11

## 2023-06-11 MED ORDER — POTASSIUM CHLORIDE IN NACL 20-0.9 MEQ/L-% IV SOLN
Freq: Once | INTRAVENOUS | Status: AC
  Filled 2023-06-11: qty 1000

## 2023-06-11 MED ORDER — PALONOSETRON HCL INJECTION 0.25 MG/5ML
0.2500 mg | Freq: Once | INTRAVENOUS | Status: AC
  Administered 2023-06-11: 0.25 mg via INTRAVENOUS
  Filled 2023-06-11: qty 5

## 2023-06-11 MED ORDER — HEPARIN SOD (PORK) LOCK FLUSH 100 UNIT/ML IV SOLN
500.0000 [IU] | Freq: Once | INTRAVENOUS | Status: AC | PRN
  Administered 2023-06-11: 500 [IU]

## 2023-06-11 MED ORDER — MAGNESIUM SULFATE 2 GM/50ML IV SOLN
2.0000 g | Freq: Once | INTRAVENOUS | Status: AC
  Administered 2023-06-11: 2 g via INTRAVENOUS
  Filled 2023-06-11: qty 50

## 2023-06-11 MED ORDER — SODIUM CHLORIDE 0.9 % IV SOLN
30.0000 mg/m2 | Freq: Once | INTRAVENOUS | Status: AC
  Administered 2023-06-11: 66 mg via INTRAVENOUS
  Filled 2023-06-11: qty 66

## 2023-06-11 MED ORDER — DEXAMETHASONE SODIUM PHOSPHATE 10 MG/ML IJ SOLN
10.0000 mg | Freq: Once | INTRAMUSCULAR | Status: AC
  Administered 2023-06-11: 10 mg via INTRAVENOUS
  Filled 2023-06-11: qty 1

## 2023-06-11 MED ORDER — SODIUM CHLORIDE 0.9 % IV SOLN
150.0000 mg | Freq: Once | INTRAVENOUS | Status: AC
  Administered 2023-06-11: 150 mg via INTRAVENOUS
  Filled 2023-06-11: qty 150

## 2023-06-11 NOTE — Progress Notes (Signed)
 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 He is receiving concurrent chemoradiation (RT start 2/3, chemo 2/7). Patient is under the care of Dr. Basilio Cairo and Dr. Arlana Pouch.    S/p PEG 1/31  Met with pt in infusion. He is fatigued. Having increased throat pain and odynophagia. Pt is trying to push po, however foods have no taste. Yesterday had one KF 1.4 shake, refried beans, chicken curry. Pt has not started using tube yet. Wife says he continues to push it off. He is agreeable to bolus education during infusion today. Patient tolerated one carton Osmolite 1.5 with 60 ml water flush before and after. He reports new onset reflux. Says the formula came back up and he could taste this. Patient is not currently taking anything for this.  Medications: reviewed   Labs: reviewed   Anthropometrics: Wt 195 lb decreased from 198 lb 8 oz on 2/28   2/21 - 205 lb 4 oz  2/14 - 206 lb 12 oz   5% wt loss in 2 weeks - this is severe for time frame  Estimated Energy Needs  Kcals: 2700-3000 Protein: 122-140 Fluid: >2.7 L  NUTRITION DIAGNOSIS: Unintended wt loss ongoing    INTERVENTION:  Continue KF 1.4 shake Encourage soft smooth textures and choosing high calorie/high protein foods Bolus feeding education provided. Teach back method used. Pt agreeable to start using tube to supplement oral intake. Recommend one carton Osmolite 1.5 3/day. Pt will increase tube feedings as tolerated to goal with further decline of oral intake   Osmolite 1.5/equivalent - give 2 cartons (474 ml) QID. Flush tube with 60 ml water before/after. Provide additional 711 ml water/day to meet hydration needs. Provides 2840 kcal, 119 g protein, 1448 ml free water (2639 ml total water with flushes). 1896 ml/day meets 100% RDI   MONITORING, EVALUATION, GOAL: wt trends, intake, TF   NEXT VISIT: Friday March 14 during infusion

## 2023-06-11 NOTE — Patient Instructions (Signed)
 [facility] - A DEPT OF [facility]  Discharge Instructions: Thank you for choosing [facility] to provide your oncology and hematology care.   If you have a lab appointment with the Cancer Center, please go directly to the Cancer Center and check in at the registration area.   Wear comfortable clothing and clothing appropriate for easy access to any Portacath or PICC line.   We strive to give you quality time with your provider. You may need to reschedule your appointment if you arrive late (15 or more minutes).  Arriving late affects you and other patients whose appointments are after yours.  Also, if you miss three or more appointments without notifying the office, you may be dismissed from the clinic at the provider's discretion.      For prescription refill requests, have your pharmacy contact our office and allow 72 hours for refills to be completed.    Today you received the following chemotherapy and/or immunotherapy agents cisplatin      To help prevent nausea and vomiting after your treatment, we encourage you to take your nausea medication as directed.  BELOW ARE SYMPTOMS THAT SHOULD BE REPORTED IMMEDIATELY: *FEVER GREATER THAN 100.4 F (38 C) OR HIGHER *CHILLS OR SWEATING *NAUSEA AND VOMITING THAT IS NOT CONTROLLED WITH YOUR NAUSEA MEDICATION *UNUSUAL SHORTNESS OF BREATH *UNUSUAL BRUISING OR BLEEDING *URINARY PROBLEMS (pain or burning when urinating, or frequent urination) *BOWEL PROBLEMS (unusual diarrhea, constipation, pain near the anus) TENDERNESS IN MOUTH AND THROAT WITH OR WITHOUT PRESENCE OF ULCERS (sore throat, sores in mouth, or a toothache) UNUSUAL RASH, SWELLING OR PAIN  UNUSUAL VAGINAL DISCHARGE OR ITCHING   Items with * indicate a potential emergency and should be followed up as soon as possible or go to the Emergency Department if any problems should occur.  Please show the CHEMOTHERAPY ALERT CARD or IMMUNOTHERAPY  ALERT CARD at check-in to the Emergency Department and triage nurse.  Should you have questions after your visit or need to cancel or reschedule your appointment, please contact [facility] - A DEPT OF [facility]  Dept: [phone]  and follow the prompts.  Office hours are 8:00 a.m. to 4:30 p.m. Monday - Friday. Please note that voicemails left after 4:00 p.m. may not be returned until the following business day.  We are closed weekends and major holidays. You have access to a nurse at all times for urgent questions. Please call the main number to the clinic Dept: [phone] and follow the prompts.   For any non-urgent questions, you may also contact your provider using MyChart. We now offer e-Visits for anyone 66 and older to request care online for non-urgent symptoms. For details visit [email or web address].   Also download the MyChart app! Go to the app store, search "MyChart", open the app, select [place], and log in with your MyChart username and password.

## 2023-06-14 ENCOUNTER — Inpatient Hospital Stay

## 2023-06-14 ENCOUNTER — Ambulatory Visit
Admission: RE | Admit: 2023-06-14 | Discharge: 2023-06-14 | Disposition: A | Discharge status: Home / Self Care | Payer: No Typology Code available for payment source | Source: Ambulatory Visit | Attending: Radiation Oncology

## 2023-06-14 ENCOUNTER — Other Ambulatory Visit: Payer: Self-pay

## 2023-06-14 VITALS — BP 138/79 | HR 52 | Temp 98.6°F | Resp 17

## 2023-06-14 DIAGNOSIS — Z5111 Encounter for antineoplastic chemotherapy: Secondary | ICD-10-CM | POA: Diagnosis not present

## 2023-06-14 DIAGNOSIS — C024 Malignant neoplasm of lingual tonsil: Secondary | ICD-10-CM

## 2023-06-14 LAB — RAD ONC ARIA SESSION SUMMARY
Course Elapsed Days: 35
Plan Fractions Treated to Date: 26
Plan Prescribed Dose Per Fraction: 2 Gy
Plan Total Fractions Prescribed: 35
Plan Total Prescribed Dose: 70 Gy
Reference Point Dosage Given to Date: 52 Gy
Reference Point Session Dosage Given: 2 Gy
Session Number: 26

## 2023-06-14 MED ORDER — FILGRASTIM-SNDZ 300 MCG/0.5ML IJ SOSY
300.0000 ug | PREFILLED_SYRINGE | Freq: Once | INTRAMUSCULAR | Status: AC
  Administered 2023-06-14: 300 ug via SUBCUTANEOUS
  Filled 2023-06-14: qty 0.5

## 2023-06-14 NOTE — Patient Instructions (Signed)
 Filgrastim Injection What is this medication? FILGRASTIM (fil GRA stim) lowers the risk of infection in people who are receiving chemotherapy. It works by Systems analyst make more white blood cells, which protects your body from infection. It may also be used to help people who have been exposed to high doses of radiation. It can be used to help prepare your body before a stem cell transplant. It works by helping your bone marrow make and release stem cells into the blood. This medicine may be used for other purposes; ask your health care provider or pharmacist if you have questions. COMMON BRAND NAME(S): Neupogen, Nivestym, Nypozi, Releuko, Zarxio What should I tell my care team before I take this medication? They need to know if you have any of these conditions: History of blood diseases, such as sickle cell anemia Kidney disease Recent or ongoing radiation An unusual or allergic reaction to filgrastim, pegfilgrastim, latex, rubber, other medications, foods, dyes, or preservatives Pregnant or trying to get pregnant Breast-feeding How should I use this medication? This medication is injected under the skin or into a vein. It is usually given by your care team in a hospital or clinic setting. It may be given at home. If you get this medication at home, you will be taught how to prepare and give it. Use exactly as directed. Take it as directed on the prescription label at the same time every day. Keep taking it unless your care team tells you to stop. It is important that you put your used needles and syringes in a special sharps container. Do not put them in a trash can. If you do not have a sharps container, call your pharmacist or care team to get one. This medication comes with INSTRUCTIONS FOR USE. Ask your pharmacist for directions on how to use this medication. Read the information carefully. Talk to your pharmacist or care team if you have questions. Talk to your care team about the use of  this medication in children. While it may be prescribed for children for selected conditions, precautions do apply. Overdosage: If you think you have taken too much of this medicine contact a poison control center or emergency room at once. NOTE: This medicine is only for you. Do not share this medicine with others. What if I miss a dose? It is important not to miss any doses. Talk to your care team about what to do if you miss a dose. What may interact with this medication? Medications that may cause a release of neutrophils, such as lithium This list may not describe all possible interactions. Give your health care provider a list of all the medicines, herbs, non-prescription drugs, or dietary supplements you use. Also tell them if you smoke, drink alcohol, or use illegal drugs. Some items may interact with your medicine. What should I watch for while using this medication? Your condition will be monitored carefully while you are receiving this medication. You may need bloodwork while taking this medication. Talk to your care team about your risk of cancer. You may be more at risk for certain types of cancer if you take this medication. What side effects may I notice from receiving this medication? Side effects that you should report to your care team as soon as possible: Allergic reactions--skin rash, itching, hives, swelling of the face, lips, tongue, or throat Capillary leak syndrome--stomach or muscle pain, unusual weakness or fatigue, feeling faint or lightheaded, decrease in the amount of urine, swelling of the ankles, hands,  or feet, trouble breathing High white blood cell level--fever, fatigue, trouble breathing, night sweats, change in vision, weight loss Inflammation of the aorta--fever, fatigue, back, chest, or stomach pain, severe headache Kidney injury (glomerulonephritis)--decrease in the amount of urine, red or dark brown urine, foamy or bubbly urine, swelling of the ankles, hands,  or feet Shortness of breath or trouble breathing Spleen injury--pain in upper left stomach or shoulder Unusual bruising or bleeding Side effects that usually do not require medical attention (report to your care team if they continue or are bothersome): Back pain Bone pain Fatigue Fever Headache Nausea This list may not describe all possible side effects. Call your doctor for medical advice about side effects. You may report side effects to FDA at 1-800-FDA-1088. Where should I keep my medication? Keep out of the reach of children and pets. Keep this medication in the original packaging until you are ready to take it. Protect from light. See product for storage information. Each product may have different instructions. Get rid of any unused medication after the expiration date. To get rid of medications that are no longer needed or have expired: Take the medication to a medications take-back program. Check with your pharmacy or law enforcement to find a location. If you cannot return the medication, ask your pharmacist or care team how to get rid of this medication safely. NOTE: This sheet is a summary. It may not cover all possible information. If you have questions about this medicine, talk to your doctor, pharmacist, or health care provider.  2024 Elsevier/Gold Standard (2021-08-14 00:00:00)

## 2023-06-15 ENCOUNTER — Ambulatory Visit
Admission: RE | Admit: 2023-06-15 | Discharge: 2023-06-15 | Disposition: A | Discharge status: Home / Self Care | Payer: No Typology Code available for payment source | Source: Ambulatory Visit | Attending: Radiation Oncology | Admitting: Radiation Oncology

## 2023-06-15 ENCOUNTER — Ambulatory Visit

## 2023-06-15 ENCOUNTER — Inpatient Hospital Stay

## 2023-06-15 ENCOUNTER — Other Ambulatory Visit: Payer: Self-pay

## 2023-06-15 VITALS — BP 137/72 | HR 62 | Resp 18

## 2023-06-15 DIAGNOSIS — C024 Malignant neoplasm of lingual tonsil: Secondary | ICD-10-CM

## 2023-06-15 DIAGNOSIS — Z5111 Encounter for antineoplastic chemotherapy: Secondary | ICD-10-CM | POA: Diagnosis not present

## 2023-06-15 LAB — RAD ONC ARIA SESSION SUMMARY
Course Elapsed Days: 36
Plan Fractions Treated to Date: 27
Plan Prescribed Dose Per Fraction: 2 Gy
Plan Total Fractions Prescribed: 35
Plan Total Prescribed Dose: 70 Gy
Reference Point Dosage Given to Date: 54 Gy
Reference Point Session Dosage Given: 2 Gy
Session Number: 27

## 2023-06-15 MED ORDER — FILGRASTIM-SNDZ 300 MCG/0.5ML IJ SOSY
300.0000 ug | PREFILLED_SYRINGE | Freq: Once | INTRAMUSCULAR | Status: AC
  Administered 2023-06-15: 300 ug via SUBCUTANEOUS
  Filled 2023-06-15: qty 0.5

## 2023-06-16 ENCOUNTER — Other Ambulatory Visit: Payer: Self-pay

## 2023-06-16 ENCOUNTER — Ambulatory Visit
Admission: RE | Admit: 2023-06-16 | Discharge: 2023-06-16 | Discharge status: Home / Self Care | Payer: No Typology Code available for payment source | Source: Ambulatory Visit | Attending: Radiation Oncology

## 2023-06-16 DIAGNOSIS — Z5111 Encounter for antineoplastic chemotherapy: Secondary | ICD-10-CM | POA: Diagnosis not present

## 2023-06-16 LAB — RAD ONC ARIA SESSION SUMMARY
Course Elapsed Days: 37
Plan Fractions Treated to Date: 28
Plan Prescribed Dose Per Fraction: 2 Gy
Plan Total Fractions Prescribed: 35
Plan Total Prescribed Dose: 70 Gy
Reference Point Dosage Given to Date: 56 Gy
Reference Point Session Dosage Given: 2 Gy
Session Number: 28

## 2023-06-17 ENCOUNTER — Ambulatory Visit
Admission: RE | Admit: 2023-06-17 | Discharge: 2023-06-17 | Disposition: A | Discharge status: Home / Self Care | Payer: No Typology Code available for payment source | Source: Ambulatory Visit | Attending: Radiation Oncology | Admitting: Radiation Oncology

## 2023-06-17 ENCOUNTER — Encounter: Payer: Self-pay | Admitting: Oncology

## 2023-06-17 ENCOUNTER — Other Ambulatory Visit: Payer: Self-pay

## 2023-06-17 ENCOUNTER — Inpatient Hospital Stay: Payer: No Typology Code available for payment source

## 2023-06-17 ENCOUNTER — Inpatient Hospital Stay (HOSPITAL_BASED_OUTPATIENT_CLINIC_OR_DEPARTMENT_OTHER): Payer: No Typology Code available for payment source | Admitting: Oncology

## 2023-06-17 VITALS — BP 116/61 | HR 52 | Temp 98.3°F | Resp 17 | Wt 193.4 lb

## 2023-06-17 DIAGNOSIS — Z5111 Encounter for antineoplastic chemotherapy: Secondary | ICD-10-CM | POA: Diagnosis not present

## 2023-06-17 DIAGNOSIS — C024 Malignant neoplasm of lingual tonsil: Secondary | ICD-10-CM | POA: Diagnosis not present

## 2023-06-17 DIAGNOSIS — D6959 Other secondary thrombocytopenia: Secondary | ICD-10-CM | POA: Diagnosis not present

## 2023-06-17 DIAGNOSIS — T451X5A Adverse effect of antineoplastic and immunosuppressive drugs, initial encounter: Secondary | ICD-10-CM

## 2023-06-17 DIAGNOSIS — Z95828 Presence of other vascular implants and grafts: Secondary | ICD-10-CM

## 2023-06-17 DIAGNOSIS — D701 Agranulocytosis secondary to cancer chemotherapy: Secondary | ICD-10-CM

## 2023-06-17 LAB — CBC WITH DIFFERENTIAL (CANCER CENTER ONLY)
Abs Immature Granulocytes: 0.03 10*3/uL (ref 0.00–0.07)
Basophils Absolute: 0 10*3/uL (ref 0.0–0.1)
Basophils Relative: 0 %
Eosinophils Absolute: 0 10*3/uL (ref 0.0–0.5)
Eosinophils Relative: 1 %
HCT: 32.2 % — ABNORMAL LOW (ref 39.0–52.0)
Hemoglobin: 11 g/dL — ABNORMAL LOW (ref 13.0–17.0)
Immature Granulocytes: 1 %
Lymphocytes Relative: 9 %
Lymphs Abs: 0.3 10*3/uL — ABNORMAL LOW (ref 0.7–4.0)
MCH: 30.5 pg (ref 26.0–34.0)
MCHC: 34.2 g/dL (ref 30.0–36.0)
MCV: 89.2 fL (ref 80.0–100.0)
Monocytes Absolute: 0.4 10*3/uL (ref 0.1–1.0)
Monocytes Relative: 12 %
Neutro Abs: 2.7 10*3/uL (ref 1.7–7.7)
Neutrophils Relative %: 77 %
Platelet Count: 97 10*3/uL — ABNORMAL LOW (ref 150–400)
RBC: 3.61 MIL/uL — ABNORMAL LOW (ref 4.22–5.81)
RDW: 11.9 % (ref 11.5–15.5)
WBC Count: 3.5 10*3/uL — ABNORMAL LOW (ref 4.0–10.5)
nRBC: 0 % (ref 0.0–0.2)

## 2023-06-17 LAB — RAD ONC ARIA SESSION SUMMARY
Course Elapsed Days: 38
Plan Fractions Treated to Date: 29
Plan Prescribed Dose Per Fraction: 2 Gy
Plan Total Fractions Prescribed: 35
Plan Total Prescribed Dose: 70 Gy
Reference Point Dosage Given to Date: 58 Gy
Reference Point Session Dosage Given: 2 Gy
Session Number: 29

## 2023-06-17 LAB — BASIC METABOLIC PANEL - CANCER CENTER ONLY
Anion gap: 5 (ref 5–15)
BUN: 22 mg/dL (ref 8–23)
CO2: 28 mmol/L (ref 22–32)
Calcium: 9 mg/dL (ref 8.9–10.3)
Chloride: 102 mmol/L (ref 98–111)
Creatinine: 0.79 mg/dL (ref 0.61–1.24)
GFR, Estimated: >60 mL/min (ref >60)
Glucose, Bld: 92 mg/dL (ref 70–99)
Potassium: 4.1 mmol/L (ref 3.5–5.1)
Sodium: 135 mmol/L (ref 135–145)

## 2023-06-17 LAB — MAGNESIUM: Magnesium: 1.8 mg/dL (ref 1.7–2.4)

## 2023-06-17 MED ORDER — HEPARIN SOD (PORK) LOCK FLUSH 100 UNIT/ML IV SOLN
500.0000 [IU] | Freq: Once | INTRAVENOUS | Status: AC
  Administered 2023-06-17: 500 [IU]

## 2023-06-17 MED ORDER — SODIUM CHLORIDE 0.9% FLUSH
10.0000 mL | Freq: Once | INTRAVENOUS | Status: AC
  Administered 2023-06-17: 10 mL

## 2023-06-17 MED FILL — Fosaprepitant Dimeglumine For IV Infusion 150 MG (Base Eq): INTRAVENOUS | Qty: 5 | Status: AC

## 2023-06-17 NOTE — Assessment & Plan Note (Addendum)
-   Please review oncology history for additional details and timeline of events.  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 Unknown p16 status.  Clinically cT2,cN1,cM0.  There is concern for extracapsular extension based on imaging.  Discussed his case in tumor conference on 04/21/2023. Given the size and nature of the lymph node with concern for extracapsular extension, he has a high likelihood of requiring adjuvant chemoradiation if removed surgically.  Hence consensus opinion is to proceed with concurrent chemoradiation.  It was decided to not pursue another biopsy in an attempt to determine p16 status, since the treatment plan would not change.  -Previously I discussed tumor board recommendations with the patient.  He was agreeable to proceeding with concurrent chemoradiation. We have discussed about role of cisplatin being a radiosensitizer in the treatment of head and neck cancer.  We have discussed about the curative intent of chemoradiation for this patient. Patient was willing to proceed with weekly cisplatin.  Cisplatin will be given at 40 mg/m IV dose weekly during the course of radiation.  He started radiation from 05/10/2023.  Started chemotherapy with cisplatin from 05/14/2023.  Has been tolerating treatments well without any major side effects.  -Labs today revealed leukopenia with white count of 3500, ANC of 2700.  Thrombocytopenia with platelet count of 97,000.  Because of cytopenias, we started dose reducing cisplatin to 30 mg/m starting from cycle 5 onwards.  We will proceed with cycle 6 at 30 mg/m tomorrow, 06/18/2023.  He will also receive Zarxio 300 mcg daily for 2 days next week.   Patient has nausea medications available at home.  He has established with dietitian for supportive care.  Plan is to continue cisplatin weekly during the course of radiation.

## 2023-06-17 NOTE — Progress Notes (Signed)
 Patient seen by Dr. Archie Patten Pasam today  Vitals are within treatment parameters:Yes   Labs are within treatment parameters: Yes   Treatment plan has been signed: Yes   Per physician team, Patient is ready for treatment and there are NO modifications to the treatment plan.

## 2023-06-17 NOTE — Assessment & Plan Note (Addendum)
 White count 3500 today but ANC is 2700.  We can proceed with cycle 6 of cisplatin tomorrow but we will dose reduce to 30 mg/m and also give him Zarxio 300 mcg daily for 2 days next week.

## 2023-06-17 NOTE — Assessment & Plan Note (Signed)
 Dose reduced cisplatin as mentioned above.  Will hold chemotherapy if platelet count goes below 50,000.

## 2023-06-17 NOTE — Progress Notes (Signed)
 [facility]  ONCOLOGY CLINIC PROGRESS NOTE   Patient Care Team: Caffie Damme, MD as PCP - General (Family Medicine) Lonie Peak, MD as Attending Physician (Radiation Oncology) Serena Colonel, MD as Consulting Physician (Otolaryngology) Malmfelt, Lise Auer, RN as Oncology Nurse Navigator Meryl Crutch, MD as Consulting Physician (Oncology)  PATIENT NAME: Randy Kaufman   MR#: [id number] DOB: December 18, 1960  Date of visit: 06/17/2023   ASSESSMENT & PLAN:   Randy Kaufman is a 63 y.o.  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 Unknown p16 status.   Lingual tonsil carcinoma (HCC) - Please review oncology history for additional details and timeline of events.  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 Unknown p16 status.  Clinically cT2,cN1,cM0.  There is concern for extracapsular extension based on imaging.  Discussed his case in tumor conference on 04/21/2023. Given the size and nature of the lymph node with concern for extracapsular extension, he has a high likelihood of requiring adjuvant chemoradiation if removed surgically.  Hence consensus opinion is to proceed with concurrent chemoradiation.  It was decided to not pursue another biopsy in an attempt to determine p16 status, since the treatment plan would not change.  -Previously I discussed tumor board recommendations with the patient.  He was agreeable to proceeding with concurrent chemoradiation. We have discussed about role of cisplatin being a radiosensitizer in the treatment of head and neck cancer.  We have discussed about the curative intent of chemoradiation for this patient. Patient was willing to proceed with weekly cisplatin.   Cisplatin will be given at 40 mg/m IV dose weekly during the course of radiation.  He started radiation from 05/10/2023.  Started chemotherapy with cisplatin from 05/14/2023.  Has been tolerating treatments well without any major side effects.  -Labs today revealed leukopenia with white count of 3500, ANC of 2700.  Thrombocytopenia with platelet count of 97,000.  Because of cytopenias, we started dose reducing cisplatin to 30 mg/m starting from cycle 5 onwards.  We will proceed with cycle 6 at 30 mg/m tomorrow, 06/18/2023.  He will also receive Zarxio 300 mcg daily for 2 days next week.   Patient has nausea medications available at home.  He has established with dietitian for supportive care.  Plan is to continue cisplatin weekly during the course of radiation.   Leukopenia due to antineoplastic chemotherapy (HCC) White count 3500 today but ANC is 2700.  We can proceed with cycle 6 of cisplatin tomorrow but we will dose reduce to 30 mg/m and also give him Zarxio 300 mcg daily for 2 days next week.  Chemotherapy-induced thrombocytopenia Dose reduced cisplatin as mentioned above.  Will hold chemotherapy if platelet count goes below 50,000.     RTC in 1 week for labs, follow-up and continuation of chemotherapy.  I reviewed lab results and outside records for this visit and discussed relevant results with the patient. Diagnosis, plan of care and treatment options were also discussed in detail with the patient. Opportunity provided to ask questions and answers provided to his apparent satisfaction. Provided instructions to call our clinic with any problems, questions or concerns prior to return visit. I recommended to continue follow-up with PCP and sub-specialists. He verbalized understanding and agreed with the plan.   NCCN guidelines have been consulted  in the planning of this patient's care.  I spent a total of 30 minutes during this encounter with the patient including review of chart and  various tests results, discussions about plan of care and coordination of care plan.   Meryl Crutch, MD  06/17/2023 4:16 PM   [facility] - [facility] [address] [place] [address] Dept: [phone] Dept Fax: [phone]    CHIEF COMPLAINT/ REASON FOR VISIT:   Squamous cell carcinoma consistent with a right lingual tonsillar primary with a metastatic ipsilateral cervical node. Clinically cT2,cN1,cM0.   Current Treatment: Concurrent chemoradiation with weekly cisplatin started during the week of 05/10/2023.  INTERVAL HISTORY:    Discussed the use of AI scribe software for clinical note transcription with the patient, who gave verbal consent to proceed.   Randy Kaufman is here today for repeat clinical assessment.   He reports being able to eat by mouth, consuming nutritional drinks and ice cream, which is soothing for his throat. He also uses his feeding tube at least once a day to keep it flushed. He has been experiencing pain, but did not specify the location or nature of the pain.  The patient's white blood cell count has slightly improved, and he is scheduled for another round of chemotherapy.  The patient also reports some irritation, possibly related to radiation treatment, but denies any infection. He has been using aloe vera for relief and finds it more effective than other treatments. He also uses a mouthwash for dry mouth and a baking soda and salt solution throughout the day.  In addition to his cancer treatment, the patient has a feeding tube, which he uses at least once a day. He reports no issues with the tube.  I have reviewed the past medical history, past surgical history, social history and family history with the patient and they are unchanged from previous note.  HISTORY OF PRESENT ILLNESS:   ONCOLOGY HISTORY:   He presented to his dentist last year with c/o of a small lump  to his right neck. He was subsequently referred to Dr. Pollyann Kennedy at [facility], on 04/02/23 for further evaluation. Physical exam performed at that time noted a palpable 4 cm right level 2 neck mass. No other masses were palpated.    A biopsy of the right neck mass was collected that date (04/02/23) and showed findings consistent with metastatic squamous cell carcinoma.  There was insufficient tissue to check for p16 status.   Staging PET scan on 04/14/22 revealed hypermetabolic activity at the right lingual tonsil and right level II node. The enlarged lymph node appeared to measure approximately 2.6 cm in greatest dimension. No evidence of distant metastatic disease was seen.    He later had CT soft tissue neck at [facility].   Unknown p16 status.  Clinically cT2,cN1,cM0.  There is concern for extracapsular extension based on imaging.   Discussed his case in tumor conference on 04/21/2023. Given the size and nature of the lymph node with concern for extracapsular extension, he has a high likelihood of requiring adjuvant chemoradiation if removed surgically.  Hence consensus opinion was to proceed with concurrent chemoradiation. It was decided to not pursue another biopsy in an attempt to determine p16 status, since the treatment plan would not change.  He started radiation treatments from 05/10/2023.  Started chemotherapy with cisplatin from 05/14/2023.  Oncology History  Lingual tonsil carcinoma (  HCC)  04/20/2023 Initial Diagnosis   Lingual tonsil carcinoma (HCC)   04/22/2023 Cancer Staging   Staging form: Pharynx - HPV-Mediated Oropharynx, AJCC 8th Edition - Clinical: Stage I (cT2, cN1, cM0, p16: Unknown, HPV: Unknown) - Signed by Meryl Crutch, MD on 04/22/2023   05/14/2023 -  Chemotherapy   Patient is on Treatment Plan : HEAD/NECK Cisplatin (40) q7d         REVIEW OF SYSTEMS:   Review of Systems - Oncology  All other pertinent systems were reviewed with the patient and are  negative.  ALLERGIES: He has no known allergies.  MEDICATIONS:  Current Outpatient Medications  Medication Sig Dispense Refill   Biotin 1000 MCG tablet Take 1,000 mcg by mouth 3 (three) times daily as needed (Dry mouth).     dexamethasone (DECADRON) 4 MG tablet Take 2 tablets (8 mg) by mouth daily x 3 days starting the day after cisplatin chemotherapy. Take with food. 30 tablet 1   finasteride (PROSCAR) 5 MG tablet Take 5 mg by mouth daily. Takes 1/2 Tab to equal 2.5 mg daily     lidocaine-prilocaine (EMLA) cream Apply to affected area once 30 g 3   lidocaine (XYLOCAINE) 2 % solution Patient: Mix 1part 2% viscous lidocaine, 1part H20. Swallow 10mL of diluted mixture, before meals and at bedtime, up to QID (Patient not taking: Reported on 06/02/2023) 200 mL 3   ondansetron (ZOFRAN) 8 MG tablet Take 1 tablet (8 mg total) by mouth every 8 (eight) hours as needed for nausea or vomiting. Start on the third day after cisplatin. (Patient not taking: Reported on 06/17/2023) 30 tablet 1   pantoprazole (PROTONIX) 40 MG tablet Take 1 tablet (40 mg total) by mouth daily. (Patient not taking: Reported on 06/17/2023) 30 tablet 3   prochlorperazine (COMPAZINE) 10 MG tablet Take 1 tablet (10 mg total) by mouth every 6 (six) hours as needed (Nausea or vomiting). (Patient not taking: Reported on 06/17/2023) 30 tablet 1   tadalafil (CIALIS) 20 MG tablet Take 20 mg by mouth daily as needed. (Patient not taking: Reported on 06/02/2023)     traMADol (ULTRAM) 50 MG tablet Take 1 tablet (50 mg total) by mouth every 6 (six) hours as needed. (Patient not taking: Reported on 06/02/2023) 60 tablet 0   No current facility-administered medications for this visit.     VITALS:   Blood pressure 116/61, pulse (!) 52, temperature 98.3 F (36.8 C), temperature source Temporal, resp. rate 17, weight 193 lb 6.4 oz (87.7 kg), SpO2 100%.  Wt Readings from Last 3 Encounters:  06/17/23 193 lb 6.4 oz (87.7 kg)  06/11/23 195 lb  (88.5 kg)  06/10/23 196 lb 8 oz (89.1 kg)    Body mass index is 26.23 kg/m.    Onc Performance Status - 06/17/23 1541       ECOG Perf Status   ECOG Perf Status Fully active, able to carry on all pre-disease performance without restriction      KPS SCALE   KPS % SCORE Normal, no compliants, no evidence of disease               PHYSICAL EXAM:   Physical Exam Constitutional:      General: He is not in acute distress.    Appearance: Normal appearance.  HENT:     Head: Normocephalic and atraumatic.     Mouth/Throat:     Comments: Slight erythema in the back of the throat Eyes:     General: No scleral  icterus.    Conjunctiva/sclera: Conjunctivae normal.  Cardiovascular:     Rate and Rhythm: Normal rate and regular rhythm.     Pulses: Normal pulses.     Heart sounds: Normal heart sounds.  Pulmonary:     Effort: Pulmonary effort is normal.     Breath sounds: Normal breath sounds.  Chest:     Comments: Right-sided Port-A-Cath in place, without any evidence of infection Abdominal:     General: There is no distension.     Comments: Feeding tube in place.  No signs of infection.  Musculoskeletal:     Right lower leg: No edema.     Left lower leg: No edema.  Lymphadenopathy:     Cervical: Cervical adenopathy (firm, fixed, right sided LN, improved compared to prior) present.  Skin:    Comments: Right foot cellulitis has resolved  Neurological:     General: No focal deficit present.     Mental Status: He is alert and oriented to person, place, and time.  Psychiatric:        Mood and Affect: Mood normal.        Behavior: Behavior normal.        Thought Content: Thought content normal.       LABORATORY DATA:   I have reviewed the data as listed.  Results for orders placed or performed in visit on 06/17/23  CBC with Differential (Cancer Center Only)  Result Value Ref Range   WBC Count 3.5 (L) 4.0 - 10.5 K/uL   RBC 3.61 (L) 4.22 - 5.81 MIL/uL   Hemoglobin 11.0  (L) 13.0 - 17.0 g/dL   HCT 16.1 (L) 09.6 - 04.5 %   MCV 89.2 80.0 - 100.0 fL   MCH 30.5 26.0 - 34.0 pg   MCHC 34.2 30.0 - 36.0 g/dL   RDW 40.9 81.1 - 91.4 %   Platelet Count 97 (L) 150 - 400 K/uL   nRBC 0.0 0.0 - 0.2 %   Neutrophils Relative % 77 %   Neutro Abs 2.7 1.7 - 7.7 K/uL   Lymphocytes Relative 9 %   Lymphs Abs 0.3 (L) 0.7 - 4.0 K/uL   Monocytes Relative 12 %   Monocytes Absolute 0.4 0.1 - 1.0 K/uL   Eosinophils Relative 1 %   Eosinophils Absolute 0.0 0.0 - 0.5 K/uL   Basophils Relative 0 %   Basophils Absolute 0.0 0.0 - 0.1 K/uL   Immature Granulocytes 1 %   Abs Immature Granulocytes 0.03 0.00 - 0.07 K/uL  Basic Metabolic Panel - Cancer Center Only  Result Value Ref Range   Sodium 135 135 - 145 mmol/L   Potassium 4.1 3.5 - 5.1 mmol/L   Chloride 102 98 - 111 mmol/L   CO2 28 22 - 32 mmol/L   Glucose, Bld 92 70 - 99 mg/dL   BUN 22 8 - 23 mg/dL   Creatinine 7.82 9.56 - 1.24 mg/dL   Calcium 9.0 8.9 - 21.3 mg/dL   GFR, Estimated >08 >65 mL/min   Anion gap 5 5 - 15  Magnesium  Result Value Ref Range   Magnesium 1.8 1.7 - 2.4 mg/dL  Results for orders placed or performed in visit on 06/17/23  Rad Onc Aria Session Summary  Result Value Ref Range   Course ID [id number]    Course Start Date 04/30/2023    Session Number 29    Course First Treatment Date 05/10/2023  1:00 PM    Course Last Treatment Date 06/17/2023  2:36  PM    Course Elapsed Days 38    Reference Point ID HN dp    Reference Point Dosage Given to Date 58 Gy   Reference Point Session Dosage Given 2 Gy   Plan ID [id number]    Plan Fractions Treated to Date 29    Plan Total Fractions Prescribed 35    Plan Prescribed Dose Per Fraction 2 Gy   Plan Total Prescribed Dose 70.000000 Gy   Plan Primary Reference Point HN dp      RADIOGRAPHIC STUDIES:  I have personally reviewed the radiological images as listed and agree with the findings in the report.  VAS [place] LOWER EXTREMITY VENOUS (DVT) Result Date: 05/20/2023   Lower Venous DVT Study Patient Name:  JALEEN FINCH  Date of Exam:   05/20/2023 Medical Rec #: [id number]          Accession #:    [id number] Date of Birth: 1960/06/01           Patient Gender: M Patient Age:   32 years Exam [facility]:  [facility] Procedure:      VAS [place] LOWER EXTREMITY VENOUS (DVT) Referring Phys: Archie Patten Mane Consolo --------------------------------------------------------------------------------  Indications: Pain.  Risk Factors: Cancer Carcinoma. Comparison Study: None. Performing Technologist: Shona Simpson  Examination Guidelines: A complete evaluation includes B-mode imaging, spectral Doppler, color Doppler, and power Doppler as needed of all accessible portions of each vessel. Bilateral testing is considered an integral part of a complete examination. Limited examinations for reoccurring indications may be performed as noted. The reflux portion of the exam is performed with the patient in reverse Trendelenburg.  +---------+---------------+---------+-----------+----------+--------------+ RIGHT    CompressibilityPhasicitySpontaneityPropertiesThrombus Aging +---------+---------------+---------+-----------+----------+--------------+ CFV      Full           Yes      Yes                                 +---------+---------------+---------+-----------+----------+--------------+ SFJ      Full                                                        +---------+---------------+---------+-----------+----------+--------------+ FV Prox  Full                                                        +---------+---------------+---------+-----------+----------+--------------+ FV Mid   Full                                                        +---------+---------------+---------+-----------+----------+--------------+ FV DistalFull                                                        +---------+---------------+---------+-----------+----------+--------------+ PFV       Full                                                        +---------+---------------+---------+-----------+----------+--------------+  POP      Full           Yes      Yes                                 +---------+---------------+---------+-----------+----------+--------------+ PTV      Full                                                        +---------+---------------+---------+-----------+----------+--------------+ PERO     Full                                                        +---------+---------------+---------+-----------+----------+--------------+   +----+---------------+---------+-----------+----------+--------------+ LEFTCompressibilityPhasicitySpontaneityPropertiesThrombus Aging +----+---------------+---------+-----------+----------+--------------+ CFV Full           Yes      Yes                                 +----+---------------+---------+-----------+----------+--------------+     Summary: RIGHT: - There is no evidence of deep vein thrombosis in the lower extremity.  - No cystic structure found in the popliteal fossa.  LEFT: - No evidence of common femoral vein obstruction.   *See table(s) above for measurements and observations. Electronically signed by Heath Lark on 05/20/2023 at 4:07:29 PM.    Final     CODE STATUS:  Code Status History     Date Active Date Inactive Code Status Order ID Comments User Context   05/07/2023 1642 05/08/2023 0512 Full Code [id number]  Roanna Banning, MD [facility]   05/07/2023 1642 05/07/2023 1642 Full Code [id number]  Roanna Banning, MD [facility]    Questions for Most Recent Historical Code Status (Order [id number])     Question Answer   By: Consent: discussion documented in EHR            No orders of the defined types were placed in this encounter.    Future Appointments  Date Time Provider Department Center  06/18/2023  8:45 AM [facility] [id number] [facility] None  06/18/2023 10:00 AM [facility] INFUSION  [facility] None  06/18/2023  3:15 PM Noreene Larsson, RD [facility] None  06/21/2023  4:00 PM [facility] [id number] [facility] None  06/22/2023  4:00 PM [facility] LINAC 4 [facility] None  06/23/2023  3:30 PM [facility] [id number] [facility] None  06/24/2023  3:15 PM [facility] FLUSH [facility] None  06/24/2023  3:45 PM Kaileigh Viswanathan, MD [facility] None  06/24/2023  4:15 PM [facility] [id number] [facility] None  06/25/2023  9:15 AM [facility] [id number] [facility] None  06/25/2023 10:00 AM [facility] INFUSION [facility] None  07/02/2023  9:00 AM Tevis, Julieanne Manson, PT [facility] None  07/02/2023 10:15 AM Schinke, Karie Georges, CCC-SLP [id number]      This document was completed utilizing speech recognition software. Grammatical errors, random word insertions, pronoun errors, and incomplete sentences are an occasional consequence of this system due to software limitations, ambient noise, and hardware issues. Any formal questions or concerns about the content, text or information contained within the body of this dictation should be directly addressed to  the provider for clarification.

## 2023-06-18 ENCOUNTER — Inpatient Hospital Stay: Payer: No Typology Code available for payment source

## 2023-06-18 ENCOUNTER — Ambulatory Visit
Admission: RE | Admit: 2023-06-18 | Discharge: 2023-06-18 | Disposition: A | Discharge status: Home / Self Care | Payer: No Typology Code available for payment source | Source: Ambulatory Visit | Attending: Radiation Oncology | Admitting: Radiation Oncology

## 2023-06-18 ENCOUNTER — Inpatient Hospital Stay: Admitting: Dietician

## 2023-06-18 ENCOUNTER — Other Ambulatory Visit: Payer: Self-pay

## 2023-06-18 VITALS — BP 130/69 | HR 60 | Temp 97.9°F | Resp 17

## 2023-06-18 DIAGNOSIS — C024 Malignant neoplasm of lingual tonsil: Secondary | ICD-10-CM

## 2023-06-18 DIAGNOSIS — Z5111 Encounter for antineoplastic chemotherapy: Secondary | ICD-10-CM | POA: Diagnosis not present

## 2023-06-18 LAB — RAD ONC ARIA SESSION SUMMARY
Course Elapsed Days: 39
Plan Fractions Treated to Date: 30
Plan Prescribed Dose Per Fraction: 2 Gy
Plan Total Fractions Prescribed: 35
Plan Total Prescribed Dose: 70 Gy
Reference Point Dosage Given to Date: 60 Gy
Reference Point Session Dosage Given: 2 Gy
Session Number: 30

## 2023-06-18 MED ORDER — SODIUM CHLORIDE 0.9% FLUSH: 10.0000 mL | INTRAVENOUS | Status: DC | PRN

## 2023-06-18 MED ORDER — SODIUM CHLORIDE 0.9 % IV SOLN
150.0000 mg | Freq: Once | INTRAVENOUS | Status: AC
  Administered 2023-06-18: 150 mg via INTRAVENOUS
  Filled 2023-06-18: qty 150

## 2023-06-18 MED ORDER — HEPARIN SOD (PORK) LOCK FLUSH 100 UNIT/ML IV SOLN
500.0000 [IU] | Freq: Once | INTRAVENOUS | Status: DC | PRN
Start: 2023-06-18 — End: 2023-06-18

## 2023-06-18 MED ORDER — PALONOSETRON HCL INJECTION 0.25 MG/5ML
0.2500 mg | Freq: Once | INTRAVENOUS | Status: AC
  Administered 2023-06-18: 0.25 mg via INTRAVENOUS
  Filled 2023-06-18: qty 5

## 2023-06-18 MED ORDER — KATE FARMS STANDARD 1.4 EN LIQD: ENTERAL | Status: DC

## 2023-06-18 MED ORDER — POTASSIUM CHLORIDE IN NACL 20-0.9 MEQ/L-% IV SOLN
Freq: Once | INTRAVENOUS | Status: AC
  Filled 2023-06-18: qty 1000

## 2023-06-18 MED ORDER — MAGNESIUM SULFATE 2 GM/50ML IV SOLN
2.0000 g | Freq: Once | INTRAVENOUS | Status: AC
Start: 2023-06-18 — End: 2023-06-18
  Administered 2023-06-18: 2 g via INTRAVENOUS
  Filled 2023-06-18: qty 50

## 2023-06-18 MED ORDER — SODIUM CHLORIDE 0.9 % IV SOLN
30.0000 mg/m2 | Freq: Once | INTRAVENOUS | Status: AC
  Administered 2023-06-18: 66 mg via INTRAVENOUS
  Filled 2023-06-18: qty 66

## 2023-06-18 MED ORDER — SODIUM CHLORIDE 0.9 % IV SOLN: INTRAVENOUS | Status: DC

## 2023-06-18 MED ORDER — DEXAMETHASONE SODIUM PHOSPHATE 10 MG/ML IJ SOLN
10.0000 mg | Freq: Once | INTRAMUSCULAR | Status: AC
  Administered 2023-06-18: 10 mg via INTRAVENOUS
  Filled 2023-06-18: qty 1

## 2023-06-18 NOTE — Progress Notes (Signed)
 Nutrition Follow-up:   Pt with metastatic lingual SCC to cervical lymph node. He is receiving concurrent chemoradiation (RT start 2/3, chemo 2/7). Patient is under the care of Dr. Basilio Cairo and Dr. Arlana Pouch.    S/p PEG 1/31  Met with pt and wife in infusion. He reports increased fatigue. His throat is sore. Pt thinking about trying lidocaine rinses for this. Pt is unable to tolerate solid foods. He is drinking ~30 ounces of water and iconic protein shake (140 kcal, 30g) and [employer]. Yesterday had 3 KF 1.4, iconic shake, chicken noodle soup. Pt is using tube for [employer] and additional water. Pt reports reflux with Osmolite bolus. He took one pepcid. After reading possible side effects, decided not to use anymore. Pt says he does not have reflux with KF bolus.    Medications: reviewed   Labs: reviewed   Anthropometrics: Wt 193 lb 6.4 oz today   3/6 - 195 lb  2/28 - 198 lb 8 oz  2/21 - 205 lb 4 oz  2/14 - 206 lb 12 oz   Wts have decreased 6% (13lbs) in 4 weeks - this is severe  Estimated Energy Needs  Kcals: 2700-3000 Protein: 122-140 Fluid: >2.7 L  NUTRITION DIAGNOSIS: Unintended wt loss ongoing    INTERVENTION:  Reinforced importance of increasing calorie/protein energy intake to minimize further wt loss and support post treatment healing Encourage oral intake of soft moist foods as tolerated Given reflux with samples of Osmolite, recommend continuing with KF 1.4/equivalent   6 cartons Molli Posey 1.4/equivalent - give one and one half cartons (488 ml) via tube QID. Flush with 60 ml water before and after each bolus. Drink or give additional 830 ml water (3.5 cups) to meet hydration needs. Provides 1950 ml/day, 2730 kcal, 120 g protein, 1385 ml free water from formula (2695 ml total water with flushes)  MONITORING, EVALUATION, GOAL: wt trends, intake    NEXT VISIT: Friday March 21 during infusion

## 2023-06-18 NOTE — Progress Notes (Signed)
 Ok to run post hydration with cisplatin today per Dr. Arlana Pouch

## 2023-06-18 NOTE — Patient Instructions (Signed)
 [facility] - A DEPT OF [facility]  Discharge Instructions: Thank you for choosing [facility] to provide your oncology and hematology care.   If you have a lab appointment with the Cancer Center, please go directly to the Cancer Center and check in at the registration area.   Wear comfortable clothing and clothing appropriate for easy access to any Portacath or PICC line.   We strive to give you quality time with your provider. You may need to reschedule your appointment if you arrive late (15 or more minutes).  Arriving late affects you and other patients whose appointments are after yours.  Also, if you miss three or more appointments without notifying the office, you may be dismissed from the clinic at the provider's discretion.      For prescription refill requests, have your pharmacy contact our office and allow 72 hours for refills to be completed.    Today you received the following chemotherapy and/or immunotherapy agents cisplatin      To help prevent nausea and vomiting after your treatment, we encourage you to take your nausea medication as directed.  BELOW ARE SYMPTOMS THAT SHOULD BE REPORTED IMMEDIATELY: *FEVER GREATER THAN 100.4 F (38 C) OR HIGHER *CHILLS OR SWEATING *NAUSEA AND VOMITING THAT IS NOT CONTROLLED WITH YOUR NAUSEA MEDICATION *UNUSUAL SHORTNESS OF BREATH *UNUSUAL BRUISING OR BLEEDING *URINARY PROBLEMS (pain or burning when urinating, or frequent urination) *BOWEL PROBLEMS (unusual diarrhea, constipation, pain near the anus) TENDERNESS IN MOUTH AND THROAT WITH OR WITHOUT PRESENCE OF ULCERS (sore throat, sores in mouth, or a toothache) UNUSUAL RASH, SWELLING OR PAIN  UNUSUAL VAGINAL DISCHARGE OR ITCHING   Items with * indicate a potential emergency and should be followed up as soon as possible or go to the Emergency Department if any problems should occur.  Please show the CHEMOTHERAPY ALERT CARD or IMMUNOTHERAPY  ALERT CARD at check-in to the Emergency Department and triage nurse.  Should you have questions after your visit or need to cancel or reschedule your appointment, please contact [facility] - A DEPT OF [facility]  Dept: [phone]  and follow the prompts.  Office hours are 8:00 a.m. to 4:30 p.m. Monday - Friday. Please note that voicemails left after 4:00 p.m. may not be returned until the following business day.  We are closed weekends and major holidays. You have access to a nurse at all times for urgent questions. Please call the main number to the clinic Dept: [phone] and follow the prompts.   For any non-urgent questions, you may also contact your provider using MyChart. We now offer e-Visits for anyone 66 and older to request care online for non-urgent symptoms. For details visit [email or web address].   Also download the MyChart app! Go to the app store, search "MyChart", open the app, select [place], and log in with your MyChart username and password.

## 2023-06-18 NOTE — Progress Notes (Signed)
 Reduce cisplatin to 30mg /m2 per Dr Arlana Pouch

## 2023-06-21 ENCOUNTER — Telehealth: Payer: Self-pay

## 2023-06-21 ENCOUNTER — Ambulatory Visit
Admission: RE | Admit: 2023-06-21 | Discharge: 2023-06-21 | Disposition: A | Discharge status: Home / Self Care | Payer: No Typology Code available for payment source | Source: Ambulatory Visit | Attending: Radiation Oncology

## 2023-06-21 ENCOUNTER — Encounter (HOSPITAL_COMMUNITY): Payer: Self-pay | Admitting: Emergency Medicine

## 2023-06-21 ENCOUNTER — Ambulatory Visit
Admission: RE | Admit: 2023-06-21 | Discharge: 2023-06-21 | Disposition: A | Discharge status: Home / Self Care | Source: Ambulatory Visit | Attending: Radiation Oncology

## 2023-06-21 ENCOUNTER — Inpatient Hospital Stay

## 2023-06-21 ENCOUNTER — Emergency Department (HOSPITAL_COMMUNITY)

## 2023-06-21 ENCOUNTER — Other Ambulatory Visit: Payer: Self-pay

## 2023-06-21 ENCOUNTER — Emergency Department (HOSPITAL_BASED_OUTPATIENT_CLINIC_OR_DEPARTMENT_OTHER)

## 2023-06-21 ENCOUNTER — Emergency Department (HOSPITAL_COMMUNITY)
Admission: EM | Admit: 2023-06-21 | Discharge: 2023-06-21 | Disposition: A | Discharge status: Home / Self Care | Attending: Emergency Medicine | Admitting: Emergency Medicine

## 2023-06-21 VITALS — BP 105/66 | HR 66 | Resp 16

## 2023-06-21 DIAGNOSIS — L03115 Cellulitis of right lower limb: Secondary | ICD-10-CM | POA: Insufficient documentation

## 2023-06-21 DIAGNOSIS — M79661 Pain in right lower leg: Secondary | ICD-10-CM

## 2023-06-21 DIAGNOSIS — Z85828 Personal history of other malignant neoplasm of skin: Secondary | ICD-10-CM | POA: Insufficient documentation

## 2023-06-21 DIAGNOSIS — L0390 Cellulitis, unspecified: Secondary | ICD-10-CM

## 2023-06-21 DIAGNOSIS — C024 Malignant neoplasm of lingual tonsil: Secondary | ICD-10-CM

## 2023-06-21 DIAGNOSIS — M79671 Pain in right foot: Secondary | ICD-10-CM | POA: Diagnosis present

## 2023-06-21 HISTORY — DX: Malignant (primary) neoplasm, unspecified: C80.1

## 2023-06-21 LAB — RAD ONC ARIA SESSION SUMMARY
Course Elapsed Days: 42
Plan Fractions Treated to Date: 31
Plan Prescribed Dose Per Fraction: 2 Gy
Plan Total Fractions Prescribed: 35
Plan Total Prescribed Dose: 70 Gy
Reference Point Dosage Given to Date: 62 Gy
Reference Point Session Dosage Given: 2 Gy
Session Number: 31

## 2023-06-21 LAB — BASIC METABOLIC PANEL
Anion gap: 8 (ref 5–15)
BUN: 27 mg/dL — ABNORMAL HIGH (ref 8–23)
CO2: 24 mmol/L (ref 22–32)
Calcium: 8.7 mg/dL — ABNORMAL LOW (ref 8.9–10.3)
Chloride: 100 mmol/L (ref 98–111)
Creatinine, Ser: 0.85 mg/dL (ref 0.61–1.24)
GFR, Estimated: >60 mL/min (ref >60)
Glucose, Bld: 115 mg/dL — ABNORMAL HIGH (ref 70–99)
Potassium: 4.1 mmol/L (ref 3.5–5.1)
Sodium: 132 mmol/L — ABNORMAL LOW (ref 135–145)

## 2023-06-21 LAB — CBC WITH DIFFERENTIAL/PLATELET
Abs Immature Granulocytes: 0.01 10*3/uL (ref 0.00–0.07)
Basophils Absolute: 0 10*3/uL (ref 0.0–0.1)
Basophils Relative: 0 %
Eosinophils Absolute: 0 10*3/uL (ref 0.0–0.5)
Eosinophils Relative: 0 %
HCT: 28.8 % — ABNORMAL LOW (ref 39.0–52.0)
Hemoglobin: 9.6 g/dL — ABNORMAL LOW (ref 13.0–17.0)
Immature Granulocytes: 0 %
Lymphocytes Relative: 6 %
Lymphs Abs: 0.2 10*3/uL — ABNORMAL LOW (ref 0.7–4.0)
MCH: 30.3 pg (ref 26.0–34.0)
MCHC: 33.3 g/dL (ref 30.0–36.0)
MCV: 90.9 fL (ref 80.0–100.0)
Monocytes Absolute: 0.3 10*3/uL (ref 0.1–1.0)
Monocytes Relative: 14 %
Neutro Abs: 1.9 10*3/uL (ref 1.7–7.7)
Neutrophils Relative %: 80 %
Platelets: 115 10*3/uL — ABNORMAL LOW (ref 150–400)
RBC: 3.17 MIL/uL — ABNORMAL LOW (ref 4.22–5.81)
RDW: 12 % (ref 11.5–15.5)
Smear Review: NORMAL
WBC: 2.4 10*3/uL — ABNORMAL LOW (ref 4.0–10.5)
nRBC: 0 % (ref 0.0–0.2)

## 2023-06-21 LAB — I-STAT CG4 LACTIC ACID, ED: Lactic Acid, Venous: 0.5 mmol/L (ref 0.5–1.9)

## 2023-06-21 MED ORDER — DOXYCYCLINE HYCLATE 100 MG PO TABS
100.0000 mg | ORAL_TABLET | Freq: Once | ORAL | Status: AC
  Administered 2023-06-21: 100 mg via ORAL
  Filled 2023-06-21: qty 1

## 2023-06-21 MED ORDER — SODIUM CHLORIDE 0.9 % IV BOLUS
500.0000 mL | Freq: Once | INTRAVENOUS | Status: AC
  Administered 2023-06-21: 500 mL via INTRAVENOUS

## 2023-06-21 MED ORDER — DOXYCYCLINE HYCLATE 100 MG PO CAPS: 100.0000 mg | ORAL_CAPSULE | Freq: Two times a day (BID) | ORAL | 0 refills | Status: DC

## 2023-06-21 MED ORDER — FILGRASTIM-SNDZ 300 MCG/0.5ML IJ SOSY
300.0000 ug | PREFILLED_SYRINGE | Freq: Once | INTRAMUSCULAR | Status: AC
  Administered 2023-06-21: 300 ug via SUBCUTANEOUS
  Filled 2023-06-21: qty 0.5

## 2023-06-21 MED ORDER — HYDROCODONE-ACETAMINOPHEN 5-325 MG PO TABS: 1.0000 | ORAL_TABLET | ORAL | 0 refills | Status: DC | PRN

## 2023-06-21 MED ORDER — HEPARIN SOD (PORK) LOCK FLUSH 100 UNIT/ML IV SOLN
500.0000 [IU] | Freq: Once | INTRAVENOUS | Status: AC
  Administered 2023-06-21: 500 [IU]
  Filled 2023-06-21: qty 5

## 2023-06-21 NOTE — ED Provider Notes (Signed)
 [place] AT [facility] Provider Note   CSN: [id number] Arrival date & time: 06/21/23  1658     History  Chief Complaint  Patient presents with   Foot Pain    Randy Kaufman is a 63 y.o. male.  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  He is currently undergoing chemo and radiation therapy.  He presents with right foot pain and swelling.  He said he had similar symptoms in February.  He had an ultrasound done at that time which was negative for DVT.  He was seen by his oncologist February 13 and was started on Bactrim and tramadol.  He said ultimately it got better.  Over the last few days its gotten more swollen and painful again.  He denies any known fevers.  No chest pain or swelling.  Is mostly localized to his right foot.  He saw his doctor today and was told to come back to the ED for imaging.       Home Medications Prior to Admission medications   Medication Sig Start Date End Date Taking? Authorizing Provider  doxycycline (VIBRAMYCIN) 100 MG capsule Take 1 capsule (100 mg total) by mouth 2 (two) times daily. 06/21/23  Yes Rolan Bucco, MD  HYDROcodone-acetaminophen (NORCO/VICODIN) 5-325 MG tablet Take 1-2 tablets by mouth every 4 (four) hours as needed. 06/21/23  Yes Rolan Bucco, MD  Biotin 1000 MCG tablet Take 1,000 mcg by mouth 3 (three) times daily as needed (Dry mouth).    [provider]  dexamethasone (DECADRON) 4 MG tablet Take 2 tablets (8 mg) by mouth daily x 3 days starting the day after cisplatin chemotherapy. Take with food. 04/30/23   Pasam, Archie Patten, MD  finasteride (PROSCAR) 5 MG tablet Take 5 mg by mouth daily. Takes 1/2 Tab to equal 2.5 mg daily    [provider]  lidocaine (XYLOCAINE) 2 % solution Patient: Mix 1part 2% viscous lidocaine, 1part H20. Swallow 10mL of diluted mixture, before meals and at bedtime, up to QID Patient not taking:  Reported on 06/02/2023 05/17/23   Lonie Peak, MD  lidocaine-prilocaine (EMLA) cream Apply to affected area once 04/30/23   Pasam, Archie Patten, MD  Nutritional Supplements (FEEDING SUPPLEMENT, KATE FARMS STANDARD ENT 1.4,) LIQD liquid 6 cartons Molli Posey 1.4/equivalent - give one and one half cartons (488 ml) via tube QID. Flush with 60 ml water before and after each bolus. Drink or give additional 830 ml water (3.5 cups) to meet hydration needs. Provides 1950 ml/day, 2730 kcal, 120 g protein, 1385 ml free water from formula (2695 ml total water with flushes). Meets 100% DRI 06/18/23   Lonie Peak, MD  ondansetron (ZOFRAN) 8 MG tablet Take 1 tablet (8 mg total) by mouth every 8 (eight) hours as needed for nausea or vomiting. Start on the third day after cisplatin. Patient not taking: Reported on 06/17/2023 04/30/23   Pasam, Archie Patten, MD  pantoprazole (PROTONIX) 40 MG tablet Take 1 tablet (40 mg total) by mouth daily. Patient not taking: Reported on 06/17/2023 06/11/23   Pasam, Archie Patten, MD  prochlorperazine (COMPAZINE) 10 MG tablet Take 1 tablet (10 mg total) by mouth every 6 (six) hours as needed (Nausea or vomiting). Patient not taking: Reported on 06/17/2023 04/30/23   Pasam, Archie Patten, MD  tadalafil (CIALIS) 20 MG tablet Take 20 mg by mouth daily as needed. Patient not taking: Reported on 06/02/2023 01/18/23   [provider]  traMADol (ULTRAM) 50 MG tablet Take 1 tablet (50 mg total) by mouth every 6 (six) hours as needed. Patient not taking: Reported on 06/02/2023 05/20/23   Meryl Crutch, MD      Allergies    Patient has no known allergies.    Review of Systems   Review of Systems  Constitutional:  Negative for fever.  Respiratory:  Negative for shortness of breath.   Cardiovascular:  Negative for leg swelling.  Gastrointestinal:  Negative for nausea and vomiting.  Musculoskeletal:  Positive for arthralgias and joint swelling. Negative for back pain and neck pain.  Skin:  Negative for wound.   Neurological:  Negative for weakness, numbness and headaches.    Physical Exam Updated Vital Signs BP (!) 111/59   Pulse (!) 58   Temp 98.6 F (37 C)   Resp 18   SpO2 100%  Physical Exam Constitutional:      Appearance: He is well-developed.  HENT:     Head: Normocephalic and atraumatic.  Eyes:     Pupils: Pupils are equal, round, and reactive to light.  Cardiovascular:     Rate and Rhythm: Normal rate and regular rhythm.     Heart sounds: Normal heart sounds.  Pulmonary:     Effort: Pulmonary effort is normal. No respiratory distress.     Breath sounds: Normal breath sounds. No wheezing or rales.  Chest:     Chest wall: No tenderness.  Abdominal:     General: Bowel sounds are normal.     Palpations: Abdomen is soft.     Tenderness: There is no abdominal tenderness. There is no guarding or rebound.  Musculoskeletal:        General: Normal range of motion.     Cervical back: Normal range of motion and neck supple.     Comments: Patient has some mild diffuse swelling of the right foot.  There are some generalized tenderness to the foot.  There is some erythema around the midfoot area.  There is no involvement of the ankle or lower leg.  No calf tenderness.  Pedal pulses are intact.  No wounds are noted.  Lymphadenopathy:     Cervical: No cervical adenopathy.  Skin:    General: Skin is warm and dry.     Findings: No rash.  Neurological:     Mental Status: He is alert and oriented to person, place, and time.     ED Results / Procedures / Treatments   Labs (all labs ordered are listed, but only abnormal results are displayed) Labs Reviewed  BASIC METABOLIC PANEL - Abnormal; Notable for the following components:      Result Value   Sodium 132 (*)    Glucose, Bld 115 (*)    BUN 27 (*)    Calcium 8.7 (*)    All other components within normal limits  CBC WITH DIFFERENTIAL/PLATELET - Abnormal; Notable for the following components:   WBC 2.4 (*)    RBC 3.17 (*)     Hemoglobin 9.6 (*)    HCT 28.8 (*)    Platelets 115 (*)    Lymphs Abs 0.2 (*)    All other components within normal limits  I-STAT CG4 LACTIC ACID, ED    EKG None  Radiology VAS [place] LOWER EXTREMITY VENOUS (DVT) (7a-7p) Result Date: 06/21/2023  Lower Venous DVT Study Patient Name:  Randy Kaufman  Date of Exam:   06/21/2023 Medical Rec #: [id number]  Accession #:    [id number] Date of Birth: 1960/06/17           Patient Gender: M Patient Age:   66 years Exam Location:  [facility] Procedure:      VAS [place] LOWER EXTREMITY VENOUS (DVT) Referring Phys: Jorgia Manthei --------------------------------------------------------------------------------  Indications: Pain.  Risk Factors: Cancer Carcinoma. Comparison Study: No significant changes seen since previous exam 05/20/23. Performing Technologist: Shona Simpson  Examination Guidelines: A complete evaluation includes B-mode imaging, spectral Doppler, color Doppler, and power Doppler as needed of all accessible portions of each vessel. Bilateral testing is considered an integral part of a complete examination. Limited examinations for reoccurring indications may be performed as noted. The reflux portion of the exam is performed with the patient in reverse Trendelenburg.  +---------+---------------+---------+-----------+----------+--------------+ RIGHT    CompressibilityPhasicitySpontaneityPropertiesThrombus Aging +---------+---------------+---------+-----------+----------+--------------+ CFV      Full           Yes      Yes                                 +---------+---------------+---------+-----------+----------+--------------+ SFJ      Full                                                        +---------+---------------+---------+-----------+----------+--------------+ FV Prox  Full                                                        +---------+---------------+---------+-----------+----------+--------------+ FV Mid    Full                                                        +---------+---------------+---------+-----------+----------+--------------+ FV DistalFull                                                        +---------+---------------+---------+-----------+----------+--------------+ PFV      Full                                                        +---------+---------------+---------+-----------+----------+--------------+ POP      Full           Yes      Yes                                 +---------+---------------+---------+-----------+----------+--------------+ PTV      Full                                                        +---------+---------------+---------+-----------+----------+--------------+  PERO     Full                                                        +---------+---------------+---------+-----------+----------+--------------+   +----+---------------+---------+-----------+----------+--------------+ LEFTCompressibilityPhasicitySpontaneityPropertiesThrombus Aging +----+---------------+---------+-----------+----------+--------------+ CFV Full           Yes      Yes                                 +----+---------------+---------+-----------+----------+--------------+     Summary: RIGHT: - There is no evidence of deep vein thrombosis in the lower extremity.  - No cystic structure found in the popliteal fossa.  LEFT: - No evidence of common femoral vein obstruction.   *See table(s) above for measurements and observations.    Preliminary    DG Foot Complete Right Result Date: 06/21/2023 CLINICAL DATA:  Right foot pain EXAM: RIGHT FOOT COMPLETE - 3+ VIEW COMPARISON:  None Available. FINDINGS: There is no evidence of fracture or dislocation. There is no evidence of arthropathy or other focal bone abnormality. Soft tissues are unremarkable. IMPRESSION: Negative. Electronically Signed   By: Minerva Fester M.D.   On: 06/21/2023 19:14     Procedures Procedures    Medications Ordered in ED Medications  doxycycline (VIBRA-TABS) tablet 100 mg (has no administration in time range)  sodium chloride 0.9 % bolus 500 mL (500 mLs Intravenous New Bag/Given 06/21/23 1854)    ED Course/ Medical Decision Making/ A&P                                 Medical Decision Making Amount and/or Complexity of Data Reviewed Labs: ordered. Radiology: ordered.  Risk Prescription drug management.   Patient is a 63 year old who presents with swelling and pain to his right foot.  Ultrasound was performed which shows no evidence of DVT.  X-rays were performed which were interpreted by me and confirmed by the radiologist to show no fractures or other bony injuries.  No evidence of osteomyelitis.  He does not have other history of joint issues that would be more concerning for gout or other inflammatory arthritis.  Suspect he has cellulitis.  The foot is red and warm and swollen.  There is no suggestions of systemic illness.  He is otherwise well-appearing.  No signs of sepsis.  Labs are reviewed.  He has some mild pancytopenia likely from his ongoing cancer treatments.  Will go ahead and start him on doxycycline.  Was also given this prescription for a small amount of hydrocodone.  He has an appointment in 3 days to follow-up with his oncologist.  Return precautions were given.  Final Clinical Impression(s) / ED Diagnoses Final diagnoses:  Cellulitis, unspecified cellulitis site    Rx / DC Orders ED Discharge Orders          Ordered    doxycycline (VIBRAMYCIN) 100 MG capsule  2 times daily        06/21/23 1948    HYDROcodone-acetaminophen (NORCO/VICODIN) 5-325 MG tablet  Every 4 hours PRN        06/21/23 1948              Rolan Bucco, MD 06/21/23 1951

## 2023-06-21 NOTE — Progress Notes (Signed)
 Lower extremity venous duplex completed. Please see CV Procedures for preliminary results.  Shona Simpson, RVT 06/21/23 7:27 PM

## 2023-06-21 NOTE — Patient Instructions (Signed)
 Filgrastim Injection What is this medication? FILGRASTIM (fil GRA stim) lowers the risk of infection in people who are receiving chemotherapy. It works by Systems analyst make more white blood cells, which protects your body from infection. It may also be used to help people who have been exposed to high doses of radiation. It can be used to help prepare your body before a stem cell transplant. It works by helping your bone marrow make and release stem cells into the blood. This medicine may be used for other purposes; ask your health care provider or pharmacist if you have questions. COMMON BRAND NAME(S): Neupogen, Nivestym, Nypozi, Releuko, Zarxio What should I tell my care team before I take this medication? They need to know if you have any of these conditions: History of blood diseases, such as sickle cell anemia Kidney disease Recent or ongoing radiation An unusual or allergic reaction to filgrastim, pegfilgrastim, latex, rubber, other medications, foods, dyes, or preservatives Pregnant or trying to get pregnant Breast-feeding How should I use this medication? This medication is injected under the skin or into a vein. It is usually given by your care team in a hospital or clinic setting. It may be given at home. If you get this medication at home, you will be taught how to prepare and give it. Use exactly as directed. Take it as directed on the prescription label at the same time every day. Keep taking it unless your care team tells you to stop. It is important that you put your used needles and syringes in a special sharps container. Do not put them in a trash can. If you do not have a sharps container, call your pharmacist or care team to get one. This medication comes with INSTRUCTIONS FOR USE. Ask your pharmacist for directions on how to use this medication. Read the information carefully. Talk to your pharmacist or care team if you have questions. Talk to your care team about the use of  this medication in children. While it may be prescribed for children for selected conditions, precautions do apply. Overdosage: If you think you have taken too much of this medicine contact a poison control center or emergency room at once. NOTE: This medicine is only for you. Do not share this medicine with others. What if I miss a dose? It is important not to miss any doses. Talk to your care team about what to do if you miss a dose. What may interact with this medication? Medications that may cause a release of neutrophils, such as lithium This list may not describe all possible interactions. Give your health care provider a list of all the medicines, herbs, non-prescription drugs, or dietary supplements you use. Also tell them if you smoke, drink alcohol, or use illegal drugs. Some items may interact with your medicine. What should I watch for while using this medication? Your condition will be monitored carefully while you are receiving this medication. You may need bloodwork while taking this medication. Talk to your care team about your risk of cancer. You may be more at risk for certain types of cancer if you take this medication. What side effects may I notice from receiving this medication? Side effects that you should report to your care team as soon as possible: Allergic reactions--skin rash, itching, hives, swelling of the face, lips, tongue, or throat Capillary leak syndrome--stomach or muscle pain, unusual weakness or fatigue, feeling faint or lightheaded, decrease in the amount of urine, swelling of the ankles, hands,  or feet, trouble breathing High white blood cell level--fever, fatigue, trouble breathing, night sweats, change in vision, weight loss Inflammation of the aorta--fever, fatigue, back, chest, or stomach pain, severe headache Kidney injury (glomerulonephritis)--decrease in the amount of urine, red or dark brown urine, foamy or bubbly urine, swelling of the ankles, hands,  or feet Shortness of breath or trouble breathing Spleen injury--pain in upper left stomach or shoulder Unusual bruising or bleeding Side effects that usually do not require medical attention (report to your care team if they continue or are bothersome): Back pain Bone pain Fatigue Fever Headache Nausea This list may not describe all possible side effects. Call your doctor for medical advice about side effects. You may report side effects to FDA at 1-800-FDA-1088. Where should I keep my medication? Keep out of the reach of children and pets. Keep this medication in the original packaging until you are ready to take it. Protect from light. See product for storage information. Each product may have different instructions. Get rid of any unused medication after the expiration date. To get rid of medications that are no longer needed or have expired: Take the medication to a medications take-back program. Check with your pharmacy or law enforcement to find a location. If you cannot return the medication, ask your pharmacist or care team how to get rid of this medication safely. NOTE: This sheet is a summary. It may not cover all possible information. If you have questions about this medicine, talk to your doctor, pharmacist, or health care provider.  2024 Elsevier/Gold Standard (2021-08-14 00:00:00)

## 2023-06-21 NOTE — Discharge Instructions (Addendum)
 Follow-up with your oncologist on Thursday as scheduled.  Return to the emergency room if you have any worsening symptoms.

## 2023-06-21 NOTE — ED Triage Notes (Signed)
 Patient presents due to right foot pain which his MD believes could be a blood clot. They are requesting ultrasound. Patient's last chemo was Friday and  last radiation was today.

## 2023-06-21 NOTE — Telephone Encounter (Signed)
 Patient called and left VM on [facility] nurse phone c/o foot pain with swelling and redness. Reported that he was unable to walk. Dr. Arlana Pouch notified immediately and he called the patient immediately. Dr. Arlana Pouch reported that he advised patient to go to he ED.

## 2023-06-22 ENCOUNTER — Other Ambulatory Visit: Payer: Self-pay

## 2023-06-22 ENCOUNTER — Inpatient Hospital Stay

## 2023-06-22 ENCOUNTER — Other Ambulatory Visit: Payer: Self-pay | Admitting: Oncology

## 2023-06-22 ENCOUNTER — Ambulatory Visit
Admission: RE | Admit: 2023-06-22 | Discharge: 2023-06-22 | Disposition: A | Discharge status: Home / Self Care | Payer: No Typology Code available for payment source | Source: Ambulatory Visit | Attending: Radiation Oncology

## 2023-06-22 VITALS — BP 96/54 | HR 66 | Temp 100.0°F

## 2023-06-22 DIAGNOSIS — G893 Neoplasm related pain (acute) (chronic): Secondary | ICD-10-CM

## 2023-06-22 DIAGNOSIS — Z5111 Encounter for antineoplastic chemotherapy: Secondary | ICD-10-CM | POA: Diagnosis not present

## 2023-06-22 DIAGNOSIS — C024 Malignant neoplasm of lingual tonsil: Secondary | ICD-10-CM

## 2023-06-22 LAB — RAD ONC ARIA SESSION SUMMARY
Course Elapsed Days: 43
Plan Fractions Treated to Date: 32
Plan Prescribed Dose Per Fraction: 2 Gy
Plan Total Fractions Prescribed: 35
Plan Total Prescribed Dose: 70 Gy
Reference Point Dosage Given to Date: 64 Gy
Reference Point Session Dosage Given: 2 Gy
Session Number: 32

## 2023-06-22 MED ORDER — HYDROCODONE-ACETAMINOPHEN 5-325 MG PO TABS: 1.0000 | ORAL_TABLET | Freq: Four times a day (QID) | ORAL | 0 refills | Status: DC | PRN

## 2023-06-22 MED ORDER — FILGRASTIM-SNDZ 300 MCG/0.5ML IJ SOSY
300.0000 ug | PREFILLED_SYRINGE | Freq: Once | INTRAMUSCULAR | Status: AC
  Administered 2023-06-22: 300 ug via SUBCUTANEOUS

## 2023-06-22 NOTE — Progress Notes (Signed)
 Pt has fever of 100. Pulse was 130 to start with but came down to 60's.  O2 sat good.  Pt having pain in R foot & seen in ED last eve ? Infection & script was given for ATB & pain but unable to get pain med filled b/c of pharmacy question.  Discussed above with Dr Arlana Pouch & pt encouraged to drink more fluids.  He states that he is nauseated b/c of pain & can't eat/drink well.  Suggested he use his feeding tube & add more water b/t feedings.  To call if fever goes up more especially 100.5 or higher or other signs of infection.

## 2023-06-23 ENCOUNTER — Other Ambulatory Visit: Payer: Self-pay

## 2023-06-23 ENCOUNTER — Ambulatory Visit
Admission: RE | Admit: 2023-06-23 | Discharge: 2023-06-23 | Disposition: A | Discharge status: Home / Self Care | Payer: No Typology Code available for payment source | Source: Ambulatory Visit | Attending: Radiation Oncology

## 2023-06-23 ENCOUNTER — Encounter: Payer: Self-pay | Admitting: Oncology

## 2023-06-23 DIAGNOSIS — Z5111 Encounter for antineoplastic chemotherapy: Secondary | ICD-10-CM | POA: Diagnosis not present

## 2023-06-23 LAB — RAD ONC ARIA SESSION SUMMARY
Course Elapsed Days: 44
Plan Fractions Treated to Date: 33
Plan Prescribed Dose Per Fraction: 2 Gy
Plan Total Fractions Prescribed: 35
Plan Total Prescribed Dose: 70 Gy
Reference Point Dosage Given to Date: 66 Gy
Reference Point Session Dosage Given: 2 Gy
Session Number: 33

## 2023-06-24 ENCOUNTER — Ambulatory Visit
Admission: RE | Admit: 2023-06-24 | Discharge: 2023-06-24 | Disposition: A | Discharge status: Home / Self Care | Payer: No Typology Code available for payment source | Source: Ambulatory Visit | Attending: Radiation Oncology

## 2023-06-24 ENCOUNTER — Inpatient Hospital Stay

## 2023-06-24 ENCOUNTER — Inpatient Hospital Stay: Admitting: Oncology

## 2023-06-24 ENCOUNTER — Other Ambulatory Visit: Payer: Self-pay

## 2023-06-24 ENCOUNTER — Inpatient Hospital Stay: Payer: No Typology Code available for payment source

## 2023-06-24 ENCOUNTER — Encounter: Payer: Self-pay | Admitting: Oncology

## 2023-06-24 ENCOUNTER — Inpatient Hospital Stay: Payer: No Typology Code available for payment source | Admitting: Oncology

## 2023-06-24 VITALS — BP 118/62 | HR 59 | Temp 98.2°F | Resp 17 | Ht 72.0 in | Wt 191.5 lb

## 2023-06-24 DIAGNOSIS — D701 Agranulocytosis secondary to cancer chemotherapy: Secondary | ICD-10-CM

## 2023-06-24 DIAGNOSIS — L03115 Cellulitis of right lower limb: Secondary | ICD-10-CM | POA: Diagnosis not present

## 2023-06-24 DIAGNOSIS — C024 Malignant neoplasm of lingual tonsil: Secondary | ICD-10-CM

## 2023-06-24 DIAGNOSIS — D6959 Other secondary thrombocytopenia: Secondary | ICD-10-CM | POA: Diagnosis not present

## 2023-06-24 DIAGNOSIS — Z95828 Presence of other vascular implants and grafts: Secondary | ICD-10-CM

## 2023-06-24 DIAGNOSIS — M10072 Idiopathic gout, left ankle and foot: Secondary | ICD-10-CM

## 2023-06-24 DIAGNOSIS — T451X5A Adverse effect of antineoplastic and immunosuppressive drugs, initial encounter: Secondary | ICD-10-CM

## 2023-06-24 DIAGNOSIS — Z5111 Encounter for antineoplastic chemotherapy: Secondary | ICD-10-CM | POA: Diagnosis not present

## 2023-06-24 DIAGNOSIS — M109 Gout, unspecified: Secondary | ICD-10-CM | POA: Insufficient documentation

## 2023-06-24 LAB — RAD ONC ARIA SESSION SUMMARY
Course Elapsed Days: 45
Plan Fractions Treated to Date: 34
Plan Prescribed Dose Per Fraction: 2 Gy
Plan Total Fractions Prescribed: 35
Plan Total Prescribed Dose: 70 Gy
Reference Point Dosage Given to Date: 68 Gy
Reference Point Session Dosage Given: 2 Gy
Session Number: 34

## 2023-06-24 LAB — CBC WITH DIFFERENTIAL (CANCER CENTER ONLY)
Abs Immature Granulocytes: 0.08 10*3/uL — ABNORMAL HIGH (ref 0.00–0.07)
Basophils Absolute: 0 10*3/uL (ref 0.0–0.1)
Basophils Relative: 0 %
Eosinophils Absolute: 0 10*3/uL (ref 0.0–0.5)
Eosinophils Relative: 0 %
HCT: 27.2 % — ABNORMAL LOW (ref 39.0–52.0)
Hemoglobin: 9.4 g/dL — ABNORMAL LOW (ref 13.0–17.0)
Immature Granulocytes: 2 %
Lymphocytes Relative: 4 %
Lymphs Abs: 0.2 10*3/uL — ABNORMAL LOW (ref 0.7–4.0)
MCH: 30.7 pg (ref 26.0–34.0)
MCHC: 34.6 g/dL (ref 30.0–36.0)
MCV: 88.9 fL (ref 80.0–100.0)
Monocytes Absolute: 0.7 10*3/uL (ref 0.1–1.0)
Monocytes Relative: 16 %
Neutro Abs: 3.4 10*3/uL (ref 1.7–7.7)
Neutrophils Relative %: 78 %
Platelet Count: 158 10*3/uL (ref 150–400)
RBC: 3.06 MIL/uL — ABNORMAL LOW (ref 4.22–5.81)
RDW: 12.3 % (ref 11.5–15.5)
WBC Count: 4.3 10*3/uL (ref 4.0–10.5)
nRBC: 0 % (ref 0.0–0.2)

## 2023-06-24 LAB — BASIC METABOLIC PANEL - CANCER CENTER ONLY
Anion gap: 6 (ref 5–15)
BUN: 21 mg/dL (ref 8–23)
CO2: 29 mmol/L (ref 22–32)
Calcium: 8.8 mg/dL — ABNORMAL LOW (ref 8.9–10.3)
Chloride: 97 mmol/L — ABNORMAL LOW (ref 98–111)
Creatinine: 0.92 mg/dL (ref 0.61–1.24)
GFR, Estimated: >60 mL/min (ref >60)
Glucose, Bld: 114 mg/dL — ABNORMAL HIGH (ref 70–99)
Potassium: 3.9 mmol/L (ref 3.5–5.1)
Sodium: 132 mmol/L — ABNORMAL LOW (ref 135–145)

## 2023-06-24 LAB — MAGNESIUM: Magnesium: 1.5 mg/dL — ABNORMAL LOW (ref 1.7–2.4)

## 2023-06-24 MED ORDER — HEPARIN SOD (PORK) LOCK FLUSH 100 UNIT/ML IV SOLN
500.0000 [IU] | Freq: Once | INTRAVENOUS | Status: AC
  Administered 2023-06-24: 500 [IU]

## 2023-06-24 MED ORDER — SODIUM CHLORIDE 0.9% FLUSH
10.0000 mL | Freq: Once | INTRAVENOUS | Status: AC
  Administered 2023-06-24: 10 mL

## 2023-06-24 MED ORDER — PREDNISONE 20 MG PO TABS: ORAL_TABLET | ORAL | 1 refills | Status: DC

## 2023-06-24 MED FILL — Fosaprepitant Dimeglumine For IV Infusion 150 MG (Base Eq): INTRAVENOUS | Qty: 5 | Status: AC

## 2023-06-24 NOTE — Assessment & Plan Note (Addendum)
 White count better at 4300 with ANC of 3400.  We can proceed with cycle 7 of cisplatin tomorrow but we will continue dose reduced cisplatin at 30 mg/m.

## 2023-06-24 NOTE — Assessment & Plan Note (Addendum)
 Dose reduced cisplatin previously as mentioned above.  Platelet count is currently normal

## 2023-06-24 NOTE — Progress Notes (Signed)
 Patient seen by Dr. Archie Patten Pasam today  Vitals are within treatment parameters:Yes   Labs are within treatment parameters: Yes   Treatment plan has been signed: Yes   Per physician team, Patient is ready for treatment. Please note the following modifications: Patient will receive extra Magnesium

## 2023-06-24 NOTE — Assessment & Plan Note (Signed)
 Magnesium decreased at 1.5 today.  Likely from chemotherapy.  We will add additional 2 g of mag sulfate to standard post cisplatin fluids.

## 2023-06-24 NOTE — Assessment & Plan Note (Addendum)
 Ultrasound of the right lower extremity on 05/20/2023 showed no evidence of DVT.   He did have signs of cellulitis in the right foot.  Started him on empiric antibiotic course with Bactrim DS tablet twice daily for 10 days.  Cellulitis resolved with this intervention.  Started having pain and redness of the right foot again from 06/21/2023.  He was directed to the ED and ultrasound showed no signs of DVT.  X-rays were negative for any fractures.  He was started on doxycycline course.

## 2023-06-24 NOTE — Assessment & Plan Note (Signed)
 He presents with severe pain and discoloration in both feet, initially starting in the right foot and later involving the left. The right foot was initially red and later developed a purple discoloration, with similar changes in the left foot. The pain is severe, especially in the toes, and worsens as analgesics wear off. Discoloration fluctuates, improving with rest. Previous evaluations, including x-ray and ultrasound, ruled out a thrombus. Differential diagnosis includes gout and cellulitis, but presentation is atypical for cellulitis.  A possible gout flare is suspected, given symptoms and lack of response to antibiotics. Prednisone is considered for its efficacy in reducing inflammation and pain associated with gout. - Initiate prednisone 40 mg daily until symptoms improve, then taper. - Continue doxycycline as prescribed to complete the course. - Seek emergent evaluation if symptoms worsen to ensure adequate circulation.

## 2023-06-24 NOTE — Assessment & Plan Note (Addendum)
-   Please review oncology history for additional details and timeline of events.  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 Unknown p16 status.  Clinically cT2,cN1,cM0.  There is concern for extracapsular extension based on imaging.  Discussed his case in tumor conference on 04/21/2023. Given the size and nature of the lymph node with concern for extracapsular extension, he has a high likelihood of requiring adjuvant chemoradiation if removed surgically.  Hence consensus opinion is to proceed with concurrent chemoradiation.  It was decided to not pursue another biopsy in an attempt to determine p16 status, since the treatment plan would not change.  -Previously I discussed tumor board recommendations with the patient.  He was agreeable to proceeding with concurrent chemoradiation. We have discussed about role of cisplatin being a radiosensitizer in the treatment of head and neck cancer.  We have discussed about the curative intent of chemoradiation for this patient. Patient was willing to proceed with weekly cisplatin.  Cisplatin will be given at 40 mg/m IV dose weekly during the course of radiation.  He started radiation from 05/10/2023.  Started chemotherapy with cisplatin from 05/14/2023.  Has been tolerating treatments well without any major side effects.  -Labs today revealed normal white count of 4300 with ANC of 3400, platelet count low normal at 158,000.  Hemoglobin 9.4.  Because of cytopenias, we started dose reducing cisplatin to 30 mg/m starting from cycle 5 onwards.  We will proceed with cycle 7 at 30 mg/m tomorrow, 06/25/2023.    This will be the last of the planned chemotherapy.  We expect blood counts to continue to improve over the next few weeks.  Going forward we will continue surveillance as per NCCN guidelines.

## 2023-06-24 NOTE — Progress Notes (Signed)
 [facility]  ONCOLOGY CLINIC PROGRESS NOTE   Patient Care Team: Caffie Damme, MD as PCP - General (Family Medicine) Lonie Peak, MD as Attending Physician (Radiation Oncology) Serena Colonel, MD as Consulting Physician (Otolaryngology) Malmfelt, Lise Auer, RN as Oncology Nurse Navigator Meryl Crutch, MD as Consulting Physician (Oncology)  PATIENT NAME: Randy Kaufman   MR#: [id number] DOB: 02-07-1961  Date of visit: 06/24/2023   ASSESSMENT & PLAN:   SHALAMAR CRAYS is a 63 y.o.  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 Unknown p16 status.   Lingual tonsil carcinoma (HCC) - Please review oncology history for additional details and timeline of events.  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 Unknown p16 status.  Clinically cT2,cN1,cM0.  There is concern for extracapsular extension based on imaging.  Discussed his case in tumor conference on 04/21/2023. Given the size and nature of the lymph node with concern for extracapsular extension, he has a high likelihood of requiring adjuvant chemoradiation if removed surgically.  Hence consensus opinion is to proceed with concurrent chemoradiation.  It was decided to not pursue another biopsy in an attempt to determine p16 status, since the treatment plan would not change.  -Previously I discussed tumor board recommendations with the patient.  He was agreeable to proceeding with concurrent chemoradiation. We have discussed about role of cisplatin being a radiosensitizer in the treatment of head and neck cancer.  We have discussed about the curative intent of chemoradiation for this patient. Patient was willing to proceed with weekly cisplatin.   Cisplatin will be given at 40 mg/m IV dose weekly during the course of radiation.  He started radiation from 05/10/2023.  Started chemotherapy with cisplatin from 05/14/2023.  Has been tolerating treatments well without any major side effects.  -Labs today revealed normal white count of 4300 with ANC of 3400, platelet count low normal at 158,000.  Hemoglobin 9.4.  Because of cytopenias, we started dose reducing cisplatin to 30 mg/m starting from cycle 5 onwards.  We will proceed with cycle 7 at 30 mg/m tomorrow, 06/25/2023.    This will be the last of the planned chemotherapy.  We expect blood counts to continue to improve over the next few weeks.  Going forward we will continue surveillance as per NCCN guidelines.   Leukopenia due to antineoplastic chemotherapy (HCC) White count better at 4300 with ANC of 3400.  We can proceed with cycle 7 of cisplatin tomorrow but we will continue dose reduced cisplatin at 30 mg/m.   Chemotherapy-induced thrombocytopenia Dose reduced cisplatin previously as mentioned above.  Platelet count is currently normal  Cellulitis of right foot Ultrasound of the right lower extremity on 05/20/2023 showed no evidence of DVT.   He did have signs of cellulitis in the right foot.  Started him on empiric antibiotic course with Bactrim DS tablet twice daily for 10 days.  Cellulitis resolved with this intervention.  Started having pain and redness of the right foot again from 06/21/2023.  He was directed to the ED and ultrasound showed no signs of DVT.  X-rays were negative for any fractures.  He was started on doxycycline course.  Gout involving toe of left foot He presents with severe pain and discoloration in both feet, initially starting in the right foot and  later involving the left. The right foot was initially red and later developed a purple discoloration, with similar changes in the left foot. The pain is severe, especially in the toes, and worsens as analgesics wear  off. Discoloration fluctuates, improving with rest. Previous evaluations, including x-ray and ultrasound, ruled out a thrombus. Differential diagnosis includes gout and cellulitis, but presentation is atypical for cellulitis.  A possible gout flare is suspected, given symptoms and lack of response to antibiotics. Prednisone is considered for its efficacy in reducing inflammation and pain associated with gout. - Initiate prednisone 40 mg daily until symptoms improve, then taper. - Continue doxycycline as prescribed to complete the course. - Seek emergent evaluation if symptoms worsen to ensure adequate circulation.  Hypomagnesemia Magnesium decreased at 1.5 today.  Likely from chemotherapy.  We will add additional 2 g of mag sulfate to standard post cisplatin fluids.  RTC in 1 week for evaluation of foot issues.  I reviewed lab results and outside records for this visit and discussed relevant results with the patient. Diagnosis, plan of care and treatment options were also discussed in detail with the patient. Opportunity provided to ask questions and answers provided to his apparent satisfaction. Provided instructions to call our clinic with any problems, questions or concerns prior to return visit. I recommended to continue follow-up with PCP and sub-specialists. He verbalized understanding and agreed with the plan.   NCCN guidelines have been consulted in the planning of this patient's care.  I spent a total of 45 minutes during this encounter with the patient including review of chart and various tests results, discussions about plan of care and coordination of care plan.   Meryl Crutch, MD  06/24/2023 4:38 PM  [facility] - A DEPT [facility] [address] [place] [address] Dept: [phone] Dept Fax: [phone]    CHIEF COMPLAINT/ REASON FOR VISIT:   Squamous cell carcinoma consistent with a right lingual  tonsillar primary with a metastatic ipsilateral cervical node. Clinically cT2,cN1,cM0.   Current Treatment: Concurrent chemoradiation with weekly cisplatin started during the week of 05/10/2023.  INTERVAL HISTORY:    Discussed the use of AI scribe software for clinical note transcription with the patient, who gave verbal consent to proceed.   Randy Kaufman is here today for repeat clinical assessment.   He presents with bilateral foot pain and discoloration.   Bilateral foot pain and discoloration began on the right foot and progressed to both feet. The discoloration started as white and turned red, with toes occasionally appearing dark or purple. Pain is significant, especially on the left side now, and is localized to the top of the foot, sensitive to touch, and worsened by pressure.  In the emergency room, x-ray and ultrasound were normal, excluding a blood clot. Blood tests were performed, and he was started on doxycycline, taking two tablets per day since Monday evening.  The discoloration and pain fluctuate, with intermittent improvement in color. He experiences coldness in the feet, and pain increases as pain medication wears off. He uses a wheelchair due to pain and a walker at home. No history of heart or circulation problems, and no tingling or numbness in the feet. Sensation to touch is intact. He does not smoke.  I have reviewed the past medical history, past surgical history, social history and family history with the patient and they are unchanged from previous note.  HISTORY OF PRESENT ILLNESS:  ONCOLOGY HISTORY:   He presented to his dentist last year with c/o of a small lump to his right neck. He was subsequently referred to Dr. Pollyann Kennedy at [facility], on 04/02/23 for further evaluation. Physical exam performed at that time noted a palpable 4 cm right level 2 neck mass. No other masses were palpated.    A biopsy of the right neck mass was collected that date (04/02/23)  and showed findings consistent with metastatic squamous cell carcinoma.  There was insufficient tissue to check for p16 status.   Staging PET scan on 04/14/22 revealed hypermetabolic activity at the right lingual tonsil and right level II node. The enlarged lymph node appeared to measure approximately 2.6 cm in greatest dimension. No evidence of distant metastatic disease was seen.    He later had CT soft tissue neck at [facility].   Unknown p16 status.  Clinically cT2,cN1,cM0.  There is concern for extracapsular extension based on imaging.   Discussed his case in tumor conference on 04/21/2023. Given the size and nature of the lymph node with concern for extracapsular extension, he has a high likelihood of requiring adjuvant chemoradiation if removed surgically.  Hence consensus opinion was to proceed with concurrent chemoradiation. It was decided to not pursue another biopsy in an attempt to determine p16 status, since the treatment plan would not change.  He started radiation treatments from 05/10/2023.  Started chemotherapy with cisplatin from 05/14/2023.  Oncology History  Lingual tonsil carcinoma (HCC)  04/20/2023 Initial Diagnosis   Lingual tonsil carcinoma (HCC)   04/22/2023 Cancer Staging   Staging form: Pharynx - HPV-Mediated Oropharynx, AJCC 8th Edition - Clinical: Stage I (cT2, cN1, cM0, p16: Unknown, HPV: Unknown) - Signed by Meryl Crutch, MD on 04/22/2023   05/14/2023 -  Chemotherapy   Patient is on Treatment Plan : HEAD/NECK Cisplatin (40) q7d         REVIEW OF SYSTEMS:   Review of Systems - Oncology  All other pertinent systems were reviewed with the patient and are negative.  ALLERGIES: He has no known allergies.  MEDICATIONS:  Current Outpatient Medications  Medication Sig Dispense Refill   Biotin 1000 MCG tablet Take 1,000 mcg by mouth 3 (three) times daily as needed (Dry mouth).     doxycycline (VIBRAMYCIN) 100 MG capsule Take 1 capsule (100 mg total) by  mouth 2 (two) times daily. 20 capsule 0   finasteride (PROSCAR) 5 MG tablet Take 5 mg by mouth daily. Takes 1/2 Tab to equal 2.5 mg daily     HYDROcodone-acetaminophen (NORCO/VICODIN) 5-325 MG tablet Take 1 tablet by mouth every 6 (six) hours as needed. 60 tablet 0   lidocaine-prilocaine (EMLA) cream Apply to affected area once 30 g 3   Nutritional Supplements (FEEDING SUPPLEMENT, KATE FARMS STANDARD ENT 1.4,) LIQD liquid 6 cartons Molli Posey 1.4/equivalent - give one and one half cartons (488 ml) via tube QID. Flush with 60 ml water before and after each bolus. Drink or give additional 830 ml water (3.5 cups) to meet hydration needs. Provides 1950 ml/day, 2730 kcal, 120 g protein, 1385 ml free water from formula (2695 ml total water with flushes). Meets 100% DRI     ondansetron (ZOFRAN) 8 MG tablet Take 1 tablet (8 mg total) by mouth every 8 (eight) hours as needed for nausea or vomiting. Start on the third day after cisplatin. 30 tablet 1   predniSONE (DELTASONE) 20 MG tablet Take 2 tablets by mouth daily until further instructions 40 tablet  1   lidocaine (XYLOCAINE) 2 % solution Patient: Mix 1part 2% viscous lidocaine, 1part H20. Swallow 10mL of diluted mixture, before meals and at bedtime, up to QID (Patient not taking: Reported on 06/24/2023) 200 mL 3   pantoprazole (PROTONIX) 40 MG tablet Take 1 tablet (40 mg total) by mouth daily. (Patient not taking: Reported on 06/24/2023) 30 tablet 3   prochlorperazine (COMPAZINE) 10 MG tablet Take 1 tablet (10 mg total) by mouth every 6 (six) hours as needed (Nausea or vomiting). (Patient not taking: Reported on 06/10/2023) 30 tablet 1   tadalafil (CIALIS) 20 MG tablet Take 20 mg by mouth daily as needed. (Patient not taking: Reported on 06/24/2023)     traMADol (ULTRAM) 50 MG tablet Take 1 tablet (50 mg total) by mouth every 6 (six) hours as needed. (Patient not taking: Reported on 06/24/2023) 60 tablet 0   No current facility-administered medications for  this visit.     VITALS:   Blood pressure 118/62, pulse (!) 59, temperature 98.2 F (36.8 C), temperature source Temporal, resp. rate 17, height 6' (1.829 m), weight 191 lb 8 oz (86.9 kg), SpO2 100%.  Wt Readings from Last 3 Encounters:  06/24/23 191 lb 8 oz (86.9 kg)  06/17/23 193 lb 6.4 oz (87.7 kg)  06/11/23 195 lb (88.5 kg)    Body mass index is 25.97 kg/m.    Onc Performance Status - 06/24/23 1538       ECOG Perf Status   ECOG Perf Status Fully active, able to carry on all pre-disease performance without restriction      KPS SCALE   KPS % SCORE Cares for self, unable to carry on normal activity or to do active work               PHYSICAL EXAM:   Physical Exam Constitutional:      General: He is not in acute distress.    Appearance: Normal appearance.  HENT:     Head: Normocephalic and atraumatic.     Mouth/Throat:     Comments: Slight erythema in the back of the throat Eyes:     General: No scleral icterus.    Conjunctiva/sclera: Conjunctivae normal.  Cardiovascular:     Rate and Rhythm: Normal rate and regular rhythm.     Pulses: Normal pulses.     Heart sounds: Normal heart sounds.  Pulmonary:     Effort: Pulmonary effort is normal.     Breath sounds: Normal breath sounds.  Chest:     Comments: Right-sided Port-A-Cath in place, without any evidence of infection Abdominal:     General: There is no distension.     Comments: Feeding tube in place.  No signs of infection.  Musculoskeletal:     Comments: Erythema and tenderness in the first left metatarsophalangeal joint.   Mild erythema noted on the dorsal aspect of bilateral feet, blanchable.  Tenderness to touch on the left side.  Fluctuating purple discoloration of the toes on the right side, improved with warmth  Distal pulses intact bilaterally  Lymphadenopathy:     Cervical: Cervical adenopathy (firm, fixed, right sided LN, improved compared to prior) present.  Neurological:     General:  No focal deficit present.     Mental Status: He is alert and oriented to person, place, and time.  Psychiatric:        Mood and Affect: Mood normal.        Behavior: Behavior normal.  Thought Content: Thought content normal.      LABORATORY DATA:   I have reviewed the data as listed.  Results for orders placed or performed in visit on 06/24/23  CBC with Differential (Cancer Center Only)  Result Value Ref Range   WBC Count 4.3 4.0 - 10.5 K/uL   RBC 3.06 (L) 4.22 - 5.81 MIL/uL   Hemoglobin 9.4 (L) 13.0 - 17.0 g/dL   HCT 40.9 (L) 81.1 - 91.4 %   MCV 88.9 80.0 - 100.0 fL   MCH 30.7 26.0 - 34.0 pg   MCHC 34.6 30.0 - 36.0 g/dL   RDW 78.2 95.6 - 21.3 %   Platelet Count 158 150 - 400 K/uL   nRBC 0.0 0.0 - 0.2 %   Neutrophils Relative % 78 %   Neutro Abs 3.4 1.7 - 7.7 K/uL   Lymphocytes Relative 4 %   Lymphs Abs 0.2 (L) 0.7 - 4.0 K/uL   Monocytes Relative 16 %   Monocytes Absolute 0.7 0.1 - 1.0 K/uL   Eosinophils Relative 0 %   Eosinophils Absolute 0.0 0.0 - 0.5 K/uL   Basophils Relative 0 %   Basophils Absolute 0.0 0.0 - 0.1 K/uL   Immature Granulocytes 2 %   Abs Immature Granulocytes 0.08 (H) 0.00 - 0.07 K/uL  Basic Metabolic Panel - Cancer Center Only  Result Value Ref Range   Sodium 132 (L) 135 - 145 mmol/L   Potassium 3.9 3.5 - 5.1 mmol/L   Chloride 97 (L) 98 - 111 mmol/L   CO2 29 22 - 32 mmol/L   Glucose, Bld 114 (H) 70 - 99 mg/dL   BUN 21 8 - 23 mg/dL   Creatinine 0.86 5.78 - 1.24 mg/dL   Calcium 8.8 (L) 8.9 - 10.3 mg/dL   GFR, Estimated >46 >96 mL/min   Anion gap 6 5 - 15  Magnesium  Result Value Ref Range   Magnesium 1.5 (L) 1.7 - 2.4 mg/dL  Results for orders placed or performed in visit on 06/24/23  Rad Onc Aria Session Summary  Result Value Ref Range   Course ID [id number]    Course Start Date 04/30/2023    Session Number 34    Course First Treatment Date 05/10/2023  1:00 PM    Course Last Treatment Date 06/24/2023  2:47 PM    Course Elapsed Days 45     Reference Point ID HN dp    Reference Point Dosage Given to Date 68 Gy   Reference Point Session Dosage Given 2 Gy   Plan ID [id number]    Plan Fractions Treated to Date 34    Plan Total Fractions Prescribed 35    Plan Prescribed Dose Per Fraction 2 Gy   Plan Total Prescribed Dose 70.000000 Gy   Plan Primary Reference Point HN dp      RADIOGRAPHIC STUDIES:  I have personally reviewed the radiological images as listed and agree with the findings in the report.  VAS [place] LOWER EXTREMITY VENOUS (DVT) (7a-7p) Result Date: 06/22/2023  Lower Venous DVT Study Patient Name:  Randy Kaufman  Date of Exam:   06/21/2023 Medical Rec #: [id number]          Accession #:    [id number] Date of Birth: 1960/07/05           Patient Gender: M Patient Age:   39 years Exam Location:  [facility] Procedure:      VAS [place] LOWER EXTREMITY VENOUS (DVT) Referring Phys: MELANIE BELFI --------------------------------------------------------------------------------  Indications: Pain.  Risk Factors: Cancer Carcinoma. Comparison Study: No significant changes seen since previous exam 05/20/23. Performing Technologist: Shona Simpson  Examination Guidelines: A complete evaluation includes B-mode imaging, spectral Doppler, color Doppler, and power Doppler as needed of all accessible portions of each vessel. Bilateral testing is considered an integral part of a complete examination. Limited examinations for reoccurring indications may be performed as noted. The reflux portion of the exam is performed with the patient in reverse Trendelenburg.  +---------+---------------+---------+-----------+----------+--------------+ RIGHT    CompressibilityPhasicitySpontaneityPropertiesThrombus Aging +---------+---------------+---------+-----------+----------+--------------+ CFV      Full           Yes      Yes                                 +---------+---------------+---------+-----------+----------+--------------+ SFJ      Full                                                         +---------+---------------+---------+-----------+----------+--------------+ FV Prox  Full                                                        +---------+---------------+---------+-----------+----------+--------------+ FV Mid   Full                                                        +---------+---------------+---------+-----------+----------+--------------+ FV DistalFull                                                        +---------+---------------+---------+-----------+----------+--------------+ PFV      Full                                                        +---------+---------------+---------+-----------+----------+--------------+ POP      Full           Yes      Yes                                 +---------+---------------+---------+-----------+----------+--------------+ PTV      Full                                                        +---------+---------------+---------+-----------+----------+--------------+ PERO     Full                                                        +---------+---------------+---------+-----------+----------+--------------+   +----+---------------+---------+-----------+----------+--------------+  LEFTCompressibilityPhasicitySpontaneityPropertiesThrombus Aging +----+---------------+---------+-----------+----------+--------------+ CFV Full           Yes      Yes                                 +----+---------------+---------+-----------+----------+--------------+     Summary: RIGHT: - There is no evidence of deep vein thrombosis in the lower extremity.  - No cystic structure found in the popliteal fossa.  LEFT: - No evidence of common femoral vein obstruction.   *See table(s) above for measurements and observations. Electronically signed by Lemar Livings MD on 06/22/2023 at 3:53:32 PM.    Final    DG Foot Complete Right Result Date:  06/21/2023 CLINICAL DATA:  Right foot pain EXAM: RIGHT FOOT COMPLETE - 3+ VIEW COMPARISON:  None Available. FINDINGS: There is no evidence of fracture or dislocation. There is no evidence of arthropathy or other focal bone abnormality. Soft tissues are unremarkable. IMPRESSION: Negative. Electronically Signed   By: Minerva Fester M.D.   On: 06/21/2023 19:14    CODE STATUS:  Code Status History     Date Active Date Inactive Code Status Order ID Comments User Context   05/07/2023 1642 05/08/2023 0512 Full Code [id number]  Roanna Banning, MD [facility]   05/07/2023 1642 05/07/2023 1642 Full Code [id number]  Roanna Banning, MD [facility]    Questions for Most Recent Historical Code Status (Order [id number])     Question Answer   By: Consent: discussion documented in EHR            No orders of the defined types were placed in this encounter.    Future Appointments  Date Time Provider Department Center  06/25/2023  9:15 AM [facility] [id number] [facility] None  06/25/2023 10:00 AM [facility] INFUSION [facility] None  07/01/2023 10:30 AM [facility] LAB [facility] None  07/01/2023 11:00 AM Lathaniel Legate, MD [facility] None  07/02/2023  9:00 AM Walcott, Kathryne Hitch, PT [facility] None  07/02/2023 10:15 AM Barron Alvine, CCC-SLP [facility]  07/13/2023  8:00 AM Erven Colla, PA-C [facility] None      This document was completed utilizing speech recognition software. Grammatical errors, random word insertions, pronoun errors, and incomplete sentences are an occasional consequence of this system due to software limitations, ambient noise, and hardware issues. Any formal questions or concerns about the content, text or information contained within the body of this dictation should be directly addressed to the provider for clarification.

## 2023-06-25 ENCOUNTER — Ambulatory Visit
Admission: RE | Admit: 2023-06-25 | Discharge: 2023-06-25 | Disposition: A | Discharge status: Home / Self Care | Payer: No Typology Code available for payment source | Source: Ambulatory Visit | Attending: Radiation Oncology | Admitting: Radiation Oncology

## 2023-06-25 ENCOUNTER — Ambulatory Visit: Admitting: Dietician

## 2023-06-25 ENCOUNTER — Telehealth: Payer: Self-pay | Admitting: Oncology

## 2023-06-25 ENCOUNTER — Other Ambulatory Visit: Payer: Self-pay | Admitting: Oncology

## 2023-06-25 ENCOUNTER — Inpatient Hospital Stay: Payer: No Typology Code available for payment source

## 2023-06-25 ENCOUNTER — Other Ambulatory Visit: Payer: Self-pay

## 2023-06-25 VITALS — BP 109/57 | HR 49 | Temp 98.7°F | Resp 16

## 2023-06-25 DIAGNOSIS — C024 Malignant neoplasm of lingual tonsil: Secondary | ICD-10-CM

## 2023-06-25 DIAGNOSIS — Z5111 Encounter for antineoplastic chemotherapy: Secondary | ICD-10-CM | POA: Diagnosis not present

## 2023-06-25 LAB — RAD ONC ARIA SESSION SUMMARY
Course Elapsed Days: 46
Plan Fractions Treated to Date: 35
Plan Prescribed Dose Per Fraction: 2 Gy
Plan Total Fractions Prescribed: 35
Plan Total Prescribed Dose: 70 Gy
Reference Point Dosage Given to Date: 70 Gy
Reference Point Session Dosage Given: 2 Gy
Session Number: 35

## 2023-06-25 MED ORDER — PALONOSETRON HCL INJECTION 0.25 MG/5ML
0.2500 mg | Freq: Once | INTRAVENOUS | Status: AC
  Administered 2023-06-25: 0.25 mg via INTRAVENOUS
  Filled 2023-06-25: qty 5

## 2023-06-25 MED ORDER — DEXAMETHASONE SODIUM PHOSPHATE 10 MG/ML IJ SOLN
10.0000 mg | Freq: Once | INTRAMUSCULAR | Status: AC
  Administered 2023-06-25: 10 mg via INTRAVENOUS
  Filled 2023-06-25: qty 1

## 2023-06-25 MED ORDER — MAGNESIUM SULFATE 4 GM/100ML IV SOLN
4.0000 g | Freq: Once | INTRAVENOUS | Status: AC
  Administered 2023-06-25: 4 g via INTRAVENOUS
  Filled 2023-06-25: qty 100

## 2023-06-25 MED ORDER — HEPARIN SOD (PORK) LOCK FLUSH 100 UNIT/ML IV SOLN
500.0000 [IU] | Freq: Once | INTRAVENOUS | Status: AC | PRN
  Administered 2023-06-25: 500 [IU]

## 2023-06-25 MED ORDER — SODIUM CHLORIDE 0.9 % IV SOLN: INTRAVENOUS | Status: DC

## 2023-06-25 MED ORDER — SODIUM CHLORIDE 0.9 % IV SOLN
150.0000 mg | Freq: Once | INTRAVENOUS | Status: AC
  Administered 2023-06-25: 150 mg via INTRAVENOUS
  Filled 2023-06-25: qty 150

## 2023-06-25 MED ORDER — CISPLATIN CHEMO INJECTION 100MG/100ML
30.0000 mg/m2 | Freq: Once | INTRAVENOUS | Status: AC
  Administered 2023-06-25: 66 mg via INTRAVENOUS
  Filled 2023-06-25: qty 66

## 2023-06-25 MED ORDER — SODIUM CHLORIDE 0.9% FLUSH
10.0000 mL | INTRAVENOUS | Status: DC | PRN
  Administered 2023-06-25: 10 mL

## 2023-06-25 MED ORDER — POTASSIUM CHLORIDE IN NACL 20-0.9 MEQ/L-% IV SOLN
Freq: Once | INTRAVENOUS | Status: AC
Start: 2023-06-25 — End: 2023-06-25
  Filled 2023-06-25: qty 1000

## 2023-06-25 NOTE — Progress Notes (Signed)
 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 He is receiving concurrent chemoradiation (Final RT/chemo 3/21). Patient is under the care of Dr. Basilio Cairo and Dr. Arlana Pouch.    S/p PEG 1/31  Met with pt and wife in infusion. Pt appears pale and is sleeping at visit. Wife reports recurrent cellulitis, now in both feet. She reports pt has been in severe pain the last 3-4 days. Pain is better controlled today. Pt is not eating orally. He is tolerating tube feeds. Giving 5 Molli Posey 1.4/day.    Medications: reviewed   Labs: Na 132, glucose 114  Anthropometrics: Wt 191 lb 8 oz today   3/14 - 193 lb 6.4 oz  3/6 - 195 lb  2/28 - 198 lb 8 oz  2/21 - 205 lb 4 oz  2/14 - 206 lb 12 oz   Estimated Energy Needs  Kcals: 2700-3000 Protein: 122-140 Fluid: >2.7 L  NUTRITION DIAGNOSIS: Unintended wt loss continues   INTERVENTION:  Continue KF 1.4 via tube, recommend increasing to goal (6 cartons) to support post treatment + cellulitis healing   MONITORING, EVALUATION, GOAL: wt trends, intake   NEXT VISIT: Tuesday April 8

## 2023-06-25 NOTE — Progress Notes (Signed)
 Oncology Nurse Navigator Documentation   Met with Randy Kaufman after final RT to offer support and to celebrate end of radiation treatment.   Provided verbal post-RT guidance: Importance of keeping all follow-up appts, especially those with Nutrition and SLP. Importance of protecting treatment area from sun. Continuation of Sonafine application 2-3 times daily, application of antibiotic ointment to areas of raw skin; when supply of Sonafine exhausted transition to OTC lotion with vitamin E.  Explained my role as navigator will continue for several more months, encouraged him to call me with needs/concerns.    Hedda Slade RN, BSN, OCN Head & Neck Oncology Nurse Navigator [facility] Phone # [phone]  Fax # [phone]

## 2023-06-25 NOTE — Progress Notes (Signed)
 Continue cisplatin 30mg /m2 per Dr. Arlana Pouch.  Drusilla Kanner, PharmD, [facility]

## 2023-06-25 NOTE — Patient Instructions (Signed)
 [facility] - A DEPT OF [facility]  Discharge Instructions: Thank you for choosing [facility] to provide your oncology and hematology care.   If you have a lab appointment with the Cancer Center, please go directly to the Cancer Center and check in at the registration area.   Wear comfortable clothing and clothing appropriate for easy access to any Portacath or PICC line.   We strive to give you quality time with your provider. You may need to reschedule your appointment if you arrive late (15 or more minutes).  Arriving late affects you and other patients whose appointments are after yours.  Also, if you miss three or more appointments without notifying the office, you may be dismissed from the clinic at the provider's discretion.      For prescription refill requests, have your pharmacy contact our office and allow 72 hours for refills to be completed.    Today you received the following chemotherapy and/or immunotherapy agents cisplatin      To help prevent nausea and vomiting after your treatment, we encourage you to take your nausea medication as directed.  BELOW ARE SYMPTOMS THAT SHOULD BE REPORTED IMMEDIATELY: *FEVER GREATER THAN 100.4 F (38 C) OR HIGHER *CHILLS OR SWEATING *NAUSEA AND VOMITING THAT IS NOT CONTROLLED WITH YOUR NAUSEA MEDICATION *UNUSUAL SHORTNESS OF BREATH *UNUSUAL BRUISING OR BLEEDING *URINARY PROBLEMS (pain or burning when urinating, or frequent urination) *BOWEL PROBLEMS (unusual diarrhea, constipation, pain near the anus) TENDERNESS IN MOUTH AND THROAT WITH OR WITHOUT PRESENCE OF ULCERS (sore throat, sores in mouth, or a toothache) UNUSUAL RASH, SWELLING OR PAIN  UNUSUAL VAGINAL DISCHARGE OR ITCHING   Items with * indicate a potential emergency and should be followed up as soon as possible or go to the Emergency Department if any problems should occur.  Please show the CHEMOTHERAPY ALERT CARD or IMMUNOTHERAPY  ALERT CARD at check-in to the Emergency Department and triage nurse.  Should you have questions after your visit or need to cancel or reschedule your appointment, please contact [facility] - A DEPT OF [facility]  Dept: [phone]  and follow the prompts.  Office hours are 8:00 a.m. to 4:30 p.m. Monday - Friday. Please note that voicemails left after 4:00 p.m. may not be returned until the following business day.  We are closed weekends and major holidays. You have access to a nurse at all times for urgent questions. Please call the main number to the clinic Dept: [phone] and follow the prompts.   For any non-urgent questions, you may also contact your provider using MyChart. We now offer e-Visits for anyone 66 and older to request care online for non-urgent symptoms. For details visit [email or web address].   Also download the MyChart app! Go to the app store, search "MyChart", open the app, select [place], and log in with your MyChart username and password.

## 2023-06-27 ENCOUNTER — Other Ambulatory Visit: Payer: Self-pay

## 2023-06-28 ENCOUNTER — Inpatient Hospital Stay

## 2023-06-28 VITALS — BP 118/72 | HR 56 | Temp 98.2°F | Resp 16

## 2023-06-28 DIAGNOSIS — C024 Malignant neoplasm of lingual tonsil: Secondary | ICD-10-CM

## 2023-06-28 DIAGNOSIS — Z5111 Encounter for antineoplastic chemotherapy: Secondary | ICD-10-CM | POA: Diagnosis not present

## 2023-06-28 MED ORDER — SODIUM CHLORIDE 0.9 % IV SOLN: Freq: Once | INTRAVENOUS | Status: AC

## 2023-06-28 NOTE — Radiation Completion Notes (Signed)
 Patient Name: Randy Kaufman, Randy Kaufman MRN: [id number] Date of Birth: [birth year] Referring Physician: Serena Colonel, M.D. Date of Service: 2023-06-28 Radiation Oncologist: Lonie Peak, M.D. 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, M=cM0 Intent: Curative     ==========DELIVERED PLANS==========  First Treatment Date: 2023-05-10 Last Treatment Date: 2023-06-25   Plan Name: HN_R_Tonsil Site: Tonsil, Right Technique: IMRT Mode: Photon Dose Per Fraction: 2 Gy Prescribed Dose (Delivered / Prescribed): 70 Gy / 70 Gy Prescribed Fxs (Delivered / Prescribed): 35 / 35     ==========ON TREATMENT VISIT DATES========== 2023-05-10, 2023-05-17, 2023-05-24, 2023-05-31, 2023-06-08, 2023-06-15, 2023-06-21     ==========UPCOMING VISITS========== 07/13/2023 [facility] NUT 45 Noreene Larsson, [place]  07/13/2023 [facility] ONC FOLLOW UP 20 Ammie Ferrier  07/02/2023 [facility] LYMPHEDEMA EVAL Waynette Buttery, PT  07/02/2023 [facility] TREATMENT Barron Alvine, CCC-SLP  07/01/2023 [facility] EST PT 15 Pasam, Avinash, MD  07/01/2023 [facility] LAB [facility]  06/30/2023 [facility] INFUSION 2HR30MIN (150) [facility] INF[facility]  06/28/2023 [facility] INFUSION 2HR30MIN (150) [facility] INF[facility]        ==========APPENDIX - ON TREATMENT VISIT NOTES==========   See weekly On Treatment Notes in Epic for details in the Media tab (listed as Progress notes on the On Treatment Visit Dates listed above).

## 2023-06-28 NOTE — Patient Instructions (Signed)

## 2023-06-29 ENCOUNTER — Telehealth: Payer: Self-pay | Admitting: Oncology

## 2023-06-29 NOTE — Telephone Encounter (Signed)
 Patient canceled appt

## 2023-06-30 ENCOUNTER — Inpatient Hospital Stay

## 2023-06-30 ENCOUNTER — Telehealth: Payer: Self-pay | Admitting: Oncology

## 2023-06-30 NOTE — Telephone Encounter (Signed)
 We worked [employer] in for IVF scheduled on tomorrow following his MD visit. Ansel requested this change.

## 2023-06-30 NOTE — Therapy (Signed)
 OUTPATIENT PHYSICAL THERAPY HEAD AND NECK POST RADIATION FOLLOW UP   Patient Name: Randy Kaufman MRN: [id number] DOB:[birth year], 63 y.o., male Today's Date: 07/02/2023  END OF SESSION:  PT End of Session - 07/02/23 0900     Visit Number 1    Number of Visits 1    Date for PT Re-Evaluation 07/02/23    Authorization Type [employer]    Authorization Time Period none needed    PT Start Time 0902    PT Stop Time 0954    PT Time Calculation (min) 52 min    Activity Tolerance Patient tolerated treatment well    Behavior During Therapy [facility] for tasks assessed/performed             Past Medical History:  Diagnosis Date   Cancer (HCC)    neck and throat   Past Surgical History:  Procedure Laterality Date   CHOLECYSTECTOMY     HERNIA REPAIR     IR GASTROSTOMY TUBE MOD SED  05/07/2023   IR IMAGING GUIDED PORT INSERTION  05/07/2023   IR NASO G TUBE PLC W/FL W/RAD  05/07/2023   Patient Active Problem List   Diagnosis Date Noted   Gout involving toe of left foot 06/24/2023   Hypomagnesemia 06/24/2023   Leukopenia due to antineoplastic chemotherapy (HCC) 06/10/2023   Chemotherapy-induced thrombocytopenia 06/10/2023   Cellulitis of right foot 05/20/2023   Port-A-Cath in place 05/13/2023   Encounter for antineoplastic chemotherapy 04/22/2023   Lingual tonsil carcinoma (HCC) 04/20/2023     REFERRING PROVIDER: Dr. Basilio Kaufman  REFERRING DIAG:Right Lingual Tonsilar squamos cell carcinoma  THERAPY DIAG:  Malignant neoplasm of lingual tonsil (HCC)  Abnormal posture  Right tonsillar squamous cell carcinoma (HCC)  Rationale for Evaluation and Treatment: Rehabilitation  ONSET DATE: 04/15/2023  SUBJECTIVE:                                                                                                                                                                                           SUBJECTIVE STATEMENT: The main problem I have had has been with my feet. I was in a WC for a few  weeks. No pain in neck, and throat pain is better. I see Randy Kaufman for speech today. Pt has not noticed any swelling or stiffness in his neck.  The lump is still there, but I do think it is better. 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 Hx: He presented to his dentist last year with c/o a small lump to his right neck.  He was referred to Dr. Pollyann Kaufman.04/02/23 Dr. Pollyann Kaufman noted a palpable 4 cm right level 2 neck mass. No other masses were palpated. He collected a biopsy that day that showed findings consistent with metastatic SCC, p 16 status not obtained due to not enough material. 04/15/23 PET revealing hypermetabolic activity at the right lingual tonsil and right level II node. The enlarged lymph appeared to measure 4-5 cm in greatest dimension. No evidence of metastatic disease was seen. 04/20/23 Consult with Dr. Basilio Kaufman and 04/22/23 consult with Dr. Arlana Kaufman. He will receive chemotherapy with radiation. Treatment plan: He will receive 35 fractions of radiation to his Right tonsil and bilateral neck with weekly chemotherapy which started on 2/3 and will complete 3/21. Pretreatment procedures: 05/07/23 PEG/PAC . He is seeing SLP. He has a hx of gout in his foot.   PATIENT GOALS:  Reassess how my recovery is going related to neck ROM, cervical pain, fatigue, and swelling.  PAIN:  Are you having pain? No, not in neck. Some in feet, left with gout, Right with cellulitis  PRECAUTIONS: Recent radiation, chemotherapy,Head and neck lymphedema risk,    OBJECTIVE:   POSTURE:  Forward head and rounded shoulders posture  30 SEC SIT TO STAND:  Not performed secondary to pain in his feet/ gout/cellulitis   SHOULDER AROM:   WFL   CERVICAL ROM:   Active ROM AROM   Flexion WNL  Extension WNL  Right lateral flexion WNL  Left lateral flexion WNL  Right rotation Dec 15%  Left rotation Dec  10%   (Blank rows = not tested)  Lump still noted Right lateral neck just below jaw; improved per pt. Skin well healed since radiation with 1 small area of pink scar noted on right lateral neck  LYMPHEDEMA ASSESSMENT:    Circumference in cm  4 cm superior to sternal notch around neck 39.7  6 cm superior to sternal notch around neck 39.5  8 cm superior to sternal notch around neck 39.3  R lateral nostril from base of nose to medial tragus   L lateral nostril from base of nose to medial tragus   R corner of mouth to where ear lobe meets face   L corner of mouth to where ear lobe meets face         (Blank rows=not tested)  CURRENT/PAST TREATMENTS:  Surgery type/date: NA  Chemotherapy: Yes, Cisplatin  Radiation:Yes, concurrent with chemo, final treatment 06/25/2023  OTHER SYMPTOMS: Pain No Fibrosis No Pitting edema No Infections No Decreased scar mobility No  PATIENT EDUCATION:  Education details: Cervical ROM and doing daily as a check to be sure he doesn't become limited from radiation, proper posture, towel roll for sitting, towel roll in pillow for supporting neck, physical activity starting slowly, rec. bike if he is unable to walk due to gout Person educated: Patient and Spouse Education method: Explanation, Demonstration, and Handouts Education comprehension: verbalized understanding and returned demonstration  HOME EXERCISE PROGRAM: Pt was educated in proper sitting and sleeping posture. He was educated in cervical ROM exercises in all planes x 5 ea. To perform 2x-3x/s daily especially if he notices restriction.  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 He received 35 fractions of radiation to his Right tonsil and bilateral neck with weekly chemotherapy with Cisplatin which started on 2/3 and was completed on 3/27. He presented today for evaluation.He is  doing very well with cervical ROM,  with only mild limitation right greater than left rotation. There is no visible swelling other than the lump at his right lateral neck that was treated and which he indicates has improved. He was educated in proper posture, and the importance of doing cervical ROM exercises to be sure that he doesn't tighten up as the effects of radiation can linger. We also discussed the importance of starting back to some gentle exercise when he is able. Presently he is limited by gout on the left foot and cellulitis in the right foot.We reviewed lymphedema and they were educated  about  the NLN if he desires further information. We discussed the importance of contacting his MD quickly if he notices any swelling so that treatment may start right away. He is doing very well presently, and there is no further need for therapy at this time.  Pt will benefit from skilled therapeutic intervention to improve on the following deficits: decreased activity tolerance, decreased knowledge of condition, decreased ROM, and postural dysfunction  PT treatment/interventions: ADL/Self care home management, 97110-Therapeutic exercises, 97535- Self Care, and Patient/Family education     GOALS: Goals reviewed with patient? Yes  LONG TERM GOALS:  (STG=LTG)  GOALS Name Target Date  Goal status  1 Pt will demonstrate  cervical ROM WNL and will not demonstrate any signs or symptoms of lymphedema.  07/02/2023 MET  2 Pt will be educated in proper sitting, standing and sleeping posture 07/02/2023 MET  3 Pt will be educated in HEP for cervical ROM 07/02/2023 MET       PLAN:  PT FREQUENCY/DURATION: no further needs identified  PLAN FOR NEXT SESSION: No further needs identified PHYSICAL THERAPY DISCHARGE SUMMARY  Visits from Start of Care: 1  Current functional level related to goals / functional outcomes: Achieved goals   Remaining deficits: Minor deficit in Right greater than left  rotation   Education / Equipment: HEP   Patient agrees to discharge. Patient goals were met. Patient is being discharged due to meeting the stated rehab goals.  Alvira Monday, PT 07/02/23 11:38 PM  [facility]  [address], [address]  [place] [address]  [phone]     Home exercise Program Continue doing the exercises you were given until you feel like you can do them without feeling any tightness at the end. It is best to do them for several months after completion of radiation since the effects of radiation continue past completion.   Walking Program Studies show that 30 minutes of walking per day (fast enough to elevate your heart rate) can significantly reduce the risk of a cancer recurrence. If you can't walk due to other medical reasons, we encourage you to find another activity you could do (like a stationary bike or water exercise).  Posture After treatment for head and neck cancer, people frequently sit with rounded shoulders and forward head posture because the front of the neck has become tight and it feels better. If you sit like this, you can become very tight and have pain in sitting or standing with good posture. Try to be aware of your posture and sit and stand up tall to heal properly.  Follow up PT: Please let you doctor know as soon as possible if you develop any swelling in your face or neck in the future. Lymphedema (swelling) can occur months after completion of radiation. The sooner you can let the doctor know, the sooner they can refer you back to PT. It is much  easier to treat the swelling early on.   Waynette Buttery, PT 07/02/2023, 11:08 PM

## 2023-07-01 ENCOUNTER — Inpatient Hospital Stay

## 2023-07-01 ENCOUNTER — Inpatient Hospital Stay (HOSPITAL_BASED_OUTPATIENT_CLINIC_OR_DEPARTMENT_OTHER): Admitting: Oncology

## 2023-07-01 ENCOUNTER — Encounter: Payer: Self-pay | Admitting: Oncology

## 2023-07-01 ENCOUNTER — Other Ambulatory Visit: Payer: Self-pay | Admitting: Oncology

## 2023-07-01 ENCOUNTER — Other Ambulatory Visit: Payer: Self-pay

## 2023-07-01 ENCOUNTER — Telehealth: Payer: Self-pay | Admitting: Oncology

## 2023-07-01 DIAGNOSIS — C024 Malignant neoplasm of lingual tonsil: Secondary | ICD-10-CM | POA: Diagnosis not present

## 2023-07-01 DIAGNOSIS — M10072 Idiopathic gout, left ankle and foot: Secondary | ICD-10-CM | POA: Diagnosis not present

## 2023-07-01 DIAGNOSIS — Z5111 Encounter for antineoplastic chemotherapy: Secondary | ICD-10-CM | POA: Diagnosis not present

## 2023-07-01 DIAGNOSIS — L03115 Cellulitis of right lower limb: Secondary | ICD-10-CM

## 2023-07-01 LAB — CMP (CANCER CENTER ONLY)
ALT: 16 U/L (ref 0–44)
AST: 17 U/L (ref 15–41)
Albumin: 3.3 g/dL — ABNORMAL LOW (ref 3.5–5.0)
Alkaline Phosphatase: 54 U/L (ref 38–126)
Anion gap: 5 (ref 5–15)
BUN: 28 mg/dL — ABNORMAL HIGH (ref 8–23)
CO2: 28 mmol/L (ref 22–32)
Calcium: 8.8 mg/dL — ABNORMAL LOW (ref 8.9–10.3)
Chloride: 101 mmol/L (ref 98–111)
Creatinine: 1.02 mg/dL (ref 0.61–1.24)
GFR, Estimated: >60 mL/min (ref >60)
Glucose, Bld: 103 mg/dL — ABNORMAL HIGH (ref 70–99)
Potassium: 4.3 mmol/L (ref 3.5–5.1)
Sodium: 134 mmol/L — ABNORMAL LOW (ref 135–145)
Total Bilirubin: 0.4 mg/dL (ref 0.0–1.2)
Total Protein: 6.2 g/dL — ABNORMAL LOW (ref 6.5–8.1)

## 2023-07-01 LAB — CBC WITH DIFFERENTIAL (CANCER CENTER ONLY)
Abs Immature Granulocytes: 0.22 10*3/uL — ABNORMAL HIGH (ref 0.00–0.07)
Basophils Absolute: 0 10*3/uL (ref 0.0–0.1)
Basophils Relative: 1 %
Eosinophils Absolute: 0 10*3/uL (ref 0.0–0.5)
Eosinophils Relative: 0 %
HCT: 27 % — ABNORMAL LOW (ref 39.0–52.0)
Hemoglobin: 9.3 g/dL — ABNORMAL LOW (ref 13.0–17.0)
Immature Granulocytes: 5 %
Lymphocytes Relative: 4 %
Lymphs Abs: 0.2 10*3/uL — ABNORMAL LOW (ref 0.7–4.0)
MCH: 31.2 pg (ref 26.0–34.0)
MCHC: 34.4 g/dL (ref 30.0–36.0)
MCV: 90.6 fL (ref 80.0–100.0)
Monocytes Absolute: 0.5 10*3/uL (ref 0.1–1.0)
Monocytes Relative: 11 %
Neutro Abs: 3.5 10*3/uL (ref 1.7–7.7)
Neutrophils Relative %: 79 %
Platelet Count: 312 10*3/uL (ref 150–400)
RBC: 2.98 MIL/uL — ABNORMAL LOW (ref 4.22–5.81)
RDW: 13.2 % (ref 11.5–15.5)
WBC Count: 4.3 10*3/uL (ref 4.0–10.5)
nRBC: 0 % (ref 0.0–0.2)

## 2023-07-01 LAB — MAGNESIUM: Magnesium: 1.5 mg/dL — ABNORMAL LOW (ref 1.7–2.4)

## 2023-07-01 MED ORDER — HEPARIN SOD (PORK) LOCK FLUSH 100 UNIT/ML IV SOLN
500.0000 [IU] | Freq: Once | INTRAVENOUS | Status: AC
  Administered 2023-07-01: 500 [IU] via INTRAVENOUS

## 2023-07-01 MED ORDER — SODIUM CHLORIDE 0.9% FLUSH
10.0000 mL | Freq: Once | INTRAVENOUS | Status: AC
  Administered 2023-07-01: 10 mL via INTRAVENOUS

## 2023-07-01 MED ORDER — MAGNESIUM OXIDE -MG SUPPLEMENT 400 (240 MG) MG PO TABS: 400.0000 mg | ORAL_TABLET | Freq: Every day | ORAL | 1 refills | Status: DC

## 2023-07-01 MED ORDER — SODIUM CHLORIDE 0.9 % IV SOLN: Freq: Once | INTRAVENOUS | Status: AC

## 2023-07-01 NOTE — Patient Instructions (Signed)

## 2023-07-01 NOTE — Assessment & Plan Note (Addendum)
-   Please review oncology history for additional details and timeline of events.  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 Unknown p16 status.  Clinically cT2,cN1,cM0.  There is concern for extracapsular extension based on imaging.  Discussed his case in tumor conference on 04/21/2023. Given the size and nature of the lymph node with concern for extracapsular extension, he has a high likelihood of requiring adjuvant chemoradiation if removed surgically.  Hence consensus opinion is to proceed with concurrent chemoradiation.  It was decided to not pursue another biopsy in an attempt to determine p16 status, since the treatment plan would not change.  -Previously I discussed tumor board recommendations with the patient.  He was agreeable to proceeding with concurrent chemoradiation. We have discussed about role of cisplatin being a radiosensitizer in the treatment of head and neck cancer.  We have discussed about the curative intent of chemoradiation for this patient. Patient was willing to proceed with weekly cisplatin.  Cisplatin will be given at 40 mg/m IV dose weekly during the course of radiation.  He started radiation from 05/10/2023.  Started chemotherapy with cisplatin from 05/14/2023 and completed dose #7 on 06/25/2023.  Because of cytopenias, we started dose reducing cisplatin to 30 mg/m starting from cycle 5 onwards.  He is recovering well from chemoradiation related side effects.  Going forward we will continue surveillance as per NCCN guidelines.

## 2023-07-01 NOTE — Assessment & Plan Note (Signed)
 Magnesium decreased at 1.5 today.  Likely from chemotherapy.  Started him on magnesium oxide 400 mg daily.  Will recheck this on return visit.

## 2023-07-01 NOTE — Telephone Encounter (Signed)
 Marland Kitchen

## 2023-07-01 NOTE — Progress Notes (Signed)
 [facility]  ONCOLOGY CLINIC PROGRESS NOTE   Patient Care Team: Caffie Damme, MD as PCP - General (Family Medicine) Lonie Peak, MD as Attending Physician (Radiation Oncology) Serena Colonel, MD as Consulting Physician (Otolaryngology) Malmfelt, Lise Auer, RN as Oncology Nurse Navigator Meryl Crutch, MD as Consulting Physician (Oncology)  PATIENT NAME: Randy Kaufman   MR#: [id number] DOB: 1960/11/21  Date of visit: 07/01/2023   ASSESSMENT & PLAN:   Randy Kaufman is a 63 y.o.  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 Unknown p16 status.   Lingual tonsil carcinoma (HCC) - Please review oncology history for additional details and timeline of events.  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 Unknown p16 status.  Clinically cT2,cN1,cM0.  There is concern for extracapsular extension based on imaging.  Discussed his case in tumor conference on 04/21/2023. Given the size and nature of the lymph node with concern for extracapsular extension, he has a high likelihood of requiring adjuvant chemoradiation if removed surgically.  Hence consensus opinion is to proceed with concurrent chemoradiation.  It was decided to not pursue another biopsy in an attempt to determine p16 status, since the treatment plan would not change.  -Previously I discussed tumor board recommendations with the patient.  He was agreeable to proceeding with concurrent chemoradiation. We have discussed about role of cisplatin being a radiosensitizer in the treatment of head and neck cancer.  We have discussed about the curative intent of chemoradiation for this patient. Patient was willing to proceed with weekly cisplatin.   Cisplatin will be given at 40 mg/m IV dose weekly during the course of radiation.  He started radiation from 05/10/2023.  Started chemotherapy with cisplatin from 05/14/2023 and completed dose #7 on 06/25/2023.  Because of cytopenias, we started dose reducing cisplatin to 30 mg/m starting from cycle 5 onwards.  He is recovering well from chemoradiation related side effects.  Going forward we will continue surveillance as per NCCN guidelines.   Gout involving toe of left foot On 06/24/2023, he presented with severe pain and discoloration in both feet, initially starting in the right foot and later involving the left. The pain was severe, especially in the toes, and worsens as analgesics wear off.   Clinical picture was consistent with gout in the left foot.  Gout in the left foot has improved with prednisone. The foot is no longer discolored, and he reports improved mobility. Redness has decreased, and circulation is unimpaired. Currently on 40 mg of prednisone daily and using black cherry juice adjunctively.  - Reduce prednisone to 30 mg daily for one week, then to 20 mg daily if improvement continues.  Will plan to slowly taper prednisone dose.  Cellulitis of right foot Ultrasound of the right lower extremity on 05/20/2023 showed no evidence of DVT.   He did have signs of cellulitis in the right foot.  Started him on empiric antibiotic course with Bactrim DS tablet twice daily for 10 days.  Cellulitis resolved with this intervention.  Started having pain and redness of the right foot again from 06/21/2023.  He was directed to the ED and ultrasound showed no signs of DVT.  X-rays were negative for any fractures.  He was started on doxycycline course.  He completed course yesterday.  Hypomagnesemia Magnesium  decreased at 1.5 today.  Likely from chemotherapy.  Started him on magnesium oxide 400 mg daily.  Will recheck this on return visit.   I reviewed lab results and outside records for this  visit and discussed relevant results with the patient. Diagnosis, plan of care and treatment options were also discussed in detail with the patient. Opportunity provided to ask questions and answers provided to his apparent satisfaction. Provided instructions to call our clinic with any problems, questions or concerns prior to return visit. I recommended to continue follow-up with PCP and sub-specialists. He verbalized understanding and agreed with the plan.   NCCN guidelines have been consulted in the planning of this patient's care.  I spent a total of 40 minutes during this encounter with the patient including review of chart and various tests results, discussions about plan of care and coordination of care plan.   Meryl Crutch, MD  07/01/2023 2:42 PM   [facility] - [facility] [address] [place] [address] Dept: [phone] Dept Fax: [phone]    CHIEF COMPLAINT/ REASON FOR VISIT:   Squamous cell carcinoma consistent with a right lingual tonsillar primary with a metastatic ipsilateral cervical node. Clinically cT2,cN1,cM0.   Current Treatment: Concurrent chemoradiation with weekly cisplatin started during the week of 05/10/2023.  INTERVAL HISTORY:    Discussed the use of AI scribe software for clinical note transcription with the patient, who gave verbal consent to proceed.   Randy Kaufman is here today for repeat clinical assessment.   He presents with improved symptoms after a week of prednisone treatment. The gout, which was previously causing the foot to turn purple and become painful, has improved to the point where the patient can "hobble around." The patient has been taking 40 mg of prednisone daily in the morning and has also been drinking black cherry juice.  In addition to the gout, the patient has been experiencing issues with feeding through a tube. The patient has vomited twice in  the past week after feeding, even after reducing the portion size by half. The patient has taken nausea medication on one occasion, which prevented vomiting that day.  The patient also reports a sore throat and increased saliva production, which is likely due to healing from a recent treatment. The patient has been using a baking soda salt spit to aid in recovery.   I have reviewed the past medical history, past surgical history, social history and family history with the patient and they are unchanged from previous note.  HISTORY OF PRESENT ILLNESS:   ONCOLOGY HISTORY:   He presented to his dentist last year with c/o of a small lump to his right neck. He was subsequently referred to Dr. Pollyann Kennedy at [facility], on 04/02/23 for further evaluation. Physical exam performed at that time noted a palpable 4 cm right level 2 neck mass. No other masses were palpated.    A biopsy of the right neck mass was collected that date (04/02/23) and showed findings consistent with metastatic squamous cell carcinoma.  There was insufficient tissue to check for p16 status.   Staging PET scan on 04/14/22 revealed hypermetabolic activity at the right lingual tonsil and right level II node. The enlarged lymph node appeared to measure approximately 2.6 cm in greatest dimension. No evidence of distant metastatic disease was seen.    He later had CT soft tissue neck at [facility].  Unknown p16 status.  Clinically cT2,cN1,cM0.  There is concern for extracapsular extension based on imaging.   Discussed his case in tumor conference on 04/21/2023. Given the size and nature of the lymph node with concern for extracapsular extension, he has a high likelihood of requiring adjuvant chemoradiation if removed surgically.  Hence consensus opinion was to proceed with concurrent chemoradiation. It was decided to not pursue another biopsy in an attempt to determine p16 status, since the treatment plan would not  change.  He started radiation treatments from 05/10/2023.  Started chemotherapy with cisplatin from 05/14/2023.  Oncology History  Lingual tonsil carcinoma (HCC)  04/20/2023 Initial Diagnosis   Lingual tonsil carcinoma (HCC)   04/22/2023 Cancer Staging   Staging form: Pharynx - HPV-Mediated Oropharynx, AJCC 8th Edition - Clinical: Stage I (cT2, cN1, cM0, p16: Unknown, HPV: Unknown) - Signed by Meryl Crutch, MD on 04/22/2023   05/14/2023 -  Chemotherapy   Patient is on Treatment Plan : HEAD/NECK Cisplatin (40) q7d         REVIEW OF SYSTEMS:   Review of Systems - Oncology  All other pertinent systems were reviewed with the patient and are negative.  ALLERGIES: He has no known allergies.  MEDICATIONS:  Current Outpatient Medications  Medication Sig Dispense Refill   magnesium oxide (MAG-OX) 400 (240 Mg) MG tablet Take 1 tablet (400 mg total) by mouth daily. 30 tablet 1   Biotin 1000 MCG tablet Take 1,000 mcg by mouth 3 (three) times daily as needed (Dry mouth).     doxycycline (VIBRAMYCIN) 100 MG capsule Take 1 capsule (100 mg total) by mouth 2 (two) times daily. 20 capsule 0   finasteride (PROSCAR) 5 MG tablet Take 5 mg by mouth daily. Takes 1/2 Tab to equal 2.5 mg daily     HYDROcodone-acetaminophen (NORCO/VICODIN) 5-325 MG tablet Take 1 tablet by mouth every 6 (six) hours as needed. 60 tablet 0   lidocaine (XYLOCAINE) 2 % solution Patient: Mix 1part 2% viscous lidocaine, 1part H20. Swallow 10mL of diluted mixture, before meals and at bedtime, up to QID (Patient not taking: Reported on 06/24/2023) 200 mL 3   lidocaine-prilocaine (EMLA) cream Apply to affected area once 30 g 3   Nutritional Supplements (FEEDING SUPPLEMENT, KATE FARMS STANDARD ENT 1.4,) LIQD liquid 6 cartons Molli Posey 1.4/equivalent - give one and one half cartons (488 ml) via tube QID. Flush with 60 ml water before and after each bolus. Drink or give additional 830 ml water (3.5 cups) to meet hydration needs.  Provides 1950 ml/day, 2730 kcal, 120 g protein, 1385 ml free water from formula (2695 ml total water with flushes). Meets 100% DRI     ondansetron (ZOFRAN) 8 MG tablet Take 1 tablet (8 mg total) by mouth every 8 (eight) hours as needed for nausea or vomiting. Start on the third day after cisplatin. 30 tablet 1   pantoprazole (PROTONIX) 40 MG tablet Take 1 tablet (40 mg total) by mouth daily. (Patient not taking: Reported on 06/24/2023) 30 tablet 3   predniSONE (DELTASONE) 20 MG tablet Take 2 tablets by mouth daily until further instructions 40 tablet 1   prochlorperazine (COMPAZINE) 10 MG tablet Take 1 tablet (10 mg total) by mouth every 6 (six) hours as needed (Nausea or vomiting). (Patient not taking: Reported on 06/10/2023) 30 tablet 1   tadalafil (CIALIS) 20 MG tablet Take 20 mg by mouth daily as needed. (Patient not taking: Reported on 06/24/2023)     traMADol (ULTRAM) 50 MG tablet  Take 1 tablet (50 mg total) by mouth every 6 (six) hours as needed. (Patient not taking: Reported on 06/24/2023) 60 tablet 0   No current facility-administered medications for this visit.     VITALS:   There were no vitals taken for this visit.  Wt Readings from Last 3 Encounters:  07/01/23 190 lb 11.2 oz (86.5 kg)  06/24/23 191 lb 8 oz (86.9 kg)  06/17/23 193 lb 6.4 oz (87.7 kg)    There is no height or weight on file to calculate BMI.    Onc Performance Status - 07/01/23 1400       ECOG Perf Status   ECOG Perf Status Restricted in physically strenuous activity but ambulatory and able to carry out work of a light or sedentary nature, e.g., light house work, office work      KPS SCALE   KPS % SCORE Normal activity with effort, some s/s of disease                PHYSICAL EXAM:   Physical Exam Constitutional:      General: He is not in acute distress.    Appearance: Normal appearance.  HENT:     Head: Normocephalic and atraumatic.     Mouth/Throat:     Comments: Slight erythema in the back  of the throat Eyes:     General: No scleral icterus.    Conjunctiva/sclera: Conjunctivae normal.  Cardiovascular:     Rate and Rhythm: Normal rate and regular rhythm.     Pulses: Normal pulses.     Heart sounds: Normal heart sounds.  Pulmonary:     Effort: Pulmonary effort is normal.     Breath sounds: Normal breath sounds.  Chest:     Comments: Right-sided Port-A-Cath in place, without any evidence of infection Abdominal:     General: There is no distension.     Comments: Feeding tube in place.  No signs of infection.  Musculoskeletal:     Comments: Erythema and tenderness in the first left metatarsophalangeal joint has resolved.  Erythema has resolved on both feet.  No purple discoloration now.  Distal pulses intact bilaterally  Lymphadenopathy:     Cervical: Cervical adenopathy (firm, fixed, right sided LN, improved compared to prior) present.  Neurological:     General: No focal deficit present.     Mental Status: He is alert and oriented to person, place, and time.  Psychiatric:        Mood and Affect: Mood normal.        Behavior: Behavior normal.        Thought Content: Thought content normal.      LABORATORY DATA:   I have reviewed the data as listed.  Results for orders placed or performed in visit on 07/01/23  Magnesium  Result Value Ref Range   Magnesium 1.5 (L) 1.7 - 2.4 mg/dL  CMP (Cancer Center only)  Result Value Ref Range   Sodium 134 (L) 135 - 145 mmol/L   Potassium 4.3 3.5 - 5.1 mmol/L   Chloride 101 98 - 111 mmol/L   CO2 28 22 - 32 mmol/L   Glucose, Bld 103 (H) 70 - 99 mg/dL   BUN 28 (H) 8 - 23 mg/dL   Creatinine 4.09 8.11 - 1.24 mg/dL   Calcium 8.8 (L) 8.9 - 10.3 mg/dL   Total Protein 6.2 (L) 6.5 - 8.1 g/dL   Albumin 3.3 (L) 3.5 - 5.0 g/dL   AST 17 15 - 41 U/L  ALT 16 0 - 44 U/L   Alkaline Phosphatase 54 38 - 126 U/L   Total Bilirubin 0.4 0.0 - 1.2 mg/dL   GFR, Estimated >16 >10 mL/min   Anion gap 5 5 - 15  CBC with Differential (Cancer  Center Only)  Result Value Ref Range   WBC Count 4.3 4.0 - 10.5 K/uL   RBC 2.98 (L) 4.22 - 5.81 MIL/uL   Hemoglobin 9.3 (L) 13.0 - 17.0 g/dL   HCT 96.0 (L) 45.4 - 09.8 %   MCV 90.6 80.0 - 100.0 fL   MCH 31.2 26.0 - 34.0 pg   MCHC 34.4 30.0 - 36.0 g/dL   RDW 11.9 14.7 - 82.9 %   Platelet Count 312 150 - 400 K/uL   nRBC 0.0 0.0 - 0.2 %   Neutrophils Relative % 79 %   Neutro Abs 3.5 1.7 - 7.7 K/uL   Lymphocytes Relative 4 %   Lymphs Abs 0.2 (L) 0.7 - 4.0 K/uL   Monocytes Relative 11 %   Monocytes Absolute 0.5 0.1 - 1.0 K/uL   Eosinophils Relative 0 %   Eosinophils Absolute 0.0 0.0 - 0.5 K/uL   Basophils Relative 1 %   Basophils Absolute 0.0 0.0 - 0.1 K/uL   Immature Granulocytes 5 %   Abs Immature Granulocytes 0.22 (H) 0.00 - 0.07 K/uL      RADIOGRAPHIC STUDIES:  I have personally reviewed the radiological images as listed and agree with the findings in the report.  VAS [place] LOWER EXTREMITY VENOUS (DVT) (7a-7p) Result Date: 06/22/2023  Lower Venous DVT Study Patient Name:  MEYER ARORA  Date of Exam:   06/21/2023 Medical Rec #: [id number]          Accession #:    [id number] Date of Birth: 1960/04/13           Patient Gender: M Patient Age:   5 years Exam Location:  [facility] Procedure:      VAS [place] LOWER EXTREMITY VENOUS (DVT) Referring Phys: MELANIE BELFI --------------------------------------------------------------------------------  Indications: Pain.  Risk Factors: Cancer Carcinoma. Comparison Study: No significant changes seen since previous exam 05/20/23. Performing Technologist: Shona Simpson  Examination Guidelines: A complete evaluation includes B-mode imaging, spectral Doppler, color Doppler, and power Doppler as needed of all accessible portions of each vessel. Bilateral testing is considered an integral part of a complete examination. Limited examinations for reoccurring indications may be performed as noted. The reflux portion of the exam is performed with the  patient in reverse Trendelenburg.  +---------+---------------+---------+-----------+----------+--------------+ RIGHT    CompressibilityPhasicitySpontaneityPropertiesThrombus Aging +---------+---------------+---------+-----------+----------+--------------+ CFV      Full           Yes      Yes                                 +---------+---------------+---------+-----------+----------+--------------+ SFJ      Full                                                        +---------+---------------+---------+-----------+----------+--------------+ FV Prox  Full                                                        +---------+---------------+---------+-----------+----------+--------------+  FV Mid   Full                                                        +---------+---------------+---------+-----------+----------+--------------+ FV DistalFull                                                        +---------+---------------+---------+-----------+----------+--------------+ PFV      Full                                                        +---------+---------------+---------+-----------+----------+--------------+ POP      Full           Yes      Yes                                 +---------+---------------+---------+-----------+----------+--------------+ PTV      Full                                                        +---------+---------------+---------+-----------+----------+--------------+ PERO     Full                                                        +---------+---------------+---------+-----------+----------+--------------+   +----+---------------+---------+-----------+----------+--------------+ LEFTCompressibilityPhasicitySpontaneityPropertiesThrombus Aging +----+---------------+---------+-----------+----------+--------------+ CFV Full           Yes      Yes                                  +----+---------------+---------+-----------+----------+--------------+     Summary: RIGHT: - There is no evidence of deep vein thrombosis in the lower extremity.  - No cystic structure found in the popliteal fossa.  LEFT: - No evidence of common femoral vein obstruction.   *See table(s) above for measurements and observations. Electronically signed by Lemar Livings MD on 06/22/2023 at 3:53:32 PM.    Final    DG Foot Complete Right Result Date: 06/21/2023 CLINICAL DATA:  Right foot pain EXAM: RIGHT FOOT COMPLETE - 3+ VIEW COMPARISON:  None Available. FINDINGS: There is no evidence of fracture or dislocation. There is no evidence of arthropathy or other focal bone abnormality. Soft tissues are unremarkable. IMPRESSION: Negative. Electronically Signed   By: Minerva Fester M.D.   On: 06/21/2023 19:14    CODE STATUS:  Code Status History     Date Active Date Inactive Code Status Order ID Comments User Context   05/07/2023 1642 05/08/2023 0512 Full Code [id number]  Roanna Banning, MD [facility]   05/07/2023 1642 05/07/2023 1642 Full Code [id number]  Roanna Banning, MD [facility]    Questions for Most Recent Historical Code Status (Order [id number])     Question Answer   By: Consent: discussion documented in EHR            Orders Placed This Encounter  Procedures   CBC with Differential (Cancer Center Only)    Standing Status:   Future    Expected Date:   07/15/2023    Expiration Date:   06/30/2024   CMP (Cancer Center only)    Standing Status:   Future    Expected Date:   07/15/2023    Expiration Date:   06/30/2024   Magnesium    Standing Status:   Future    Expected Date:   07/15/2023    Expiration Date:   06/30/2024     Future Appointments  Date Time Provider Department Center  07/02/2023  9:00 AM Waynette Buttery, PT [facility] None  07/02/2023 10:15 AM Barron Alvine, CCC-SLP [facility]  07/13/2023  8:00 AM Erven Colla, PA-C [facility] None  07/13/2023  9:00 AM Noreene Larsson, RD [facility] None   07/15/2023 11:00 AM [facility] FLUSH [facility] None  07/15/2023 11:30 AM Carleigh Buccieri, Archie Patten, MD [facility] None      This document was completed utilizing speech recognition software. Grammatical errors, random word insertions, pronoun errors, and incomplete sentences are an occasional consequence of this system due to software limitations, ambient noise, and hardware issues. Any formal questions or concerns about the content, text or information contained within the body of this dictation should be directly addressed to the provider for clarification.

## 2023-07-01 NOTE — Assessment & Plan Note (Addendum)
 Ultrasound of the right lower extremity on 05/20/2023 showed no evidence of DVT.   He did have signs of cellulitis in the right foot.  Started him on empiric antibiotic course with Bactrim DS tablet twice daily for 10 days.  Cellulitis resolved with this intervention.  Started having pain and redness of the right foot again from 06/21/2023.  He was directed to the ED and ultrasound showed no signs of DVT.  X-rays were negative for any fractures.  He was started on doxycycline course.  He completed course yesterday.

## 2023-07-01 NOTE — Assessment & Plan Note (Addendum)
 On 06/24/2023, he presented with severe pain and discoloration in both feet, initially starting in the right foot and later involving the left. The pain was severe, especially in the toes, and worsens as analgesics wear off.   Clinical picture was consistent with gout in the left foot.  Gout in the left foot has improved with prednisone. The foot is no longer discolored, and he reports improved mobility. Redness has decreased, and circulation is unimpaired. Currently on 40 mg of prednisone daily and using black cherry juice adjunctively.  - Reduce prednisone to 30 mg daily for one week, then to 20 mg daily if improvement continues.  Will plan to slowly taper prednisone dose.

## 2023-07-02 ENCOUNTER — Ambulatory Visit: Payer: No Typology Code available for payment source | Attending: Radiation Oncology

## 2023-07-02 ENCOUNTER — Ambulatory Visit: Attending: Radiation Oncology

## 2023-07-02 ENCOUNTER — Other Ambulatory Visit: Payer: Self-pay

## 2023-07-02 DIAGNOSIS — R1310 Dysphagia, unspecified: Secondary | ICD-10-CM | POA: Diagnosis present

## 2023-07-02 DIAGNOSIS — C024 Malignant neoplasm of lingual tonsil: Secondary | ICD-10-CM | POA: Insufficient documentation

## 2023-07-02 DIAGNOSIS — R293 Abnormal posture: Secondary | ICD-10-CM | POA: Diagnosis present

## 2023-07-02 DIAGNOSIS — C099 Malignant neoplasm of tonsil, unspecified: Secondary | ICD-10-CM | POA: Insufficient documentation

## 2023-07-02 NOTE — Therapy (Signed)
 OUTPATIENT SPEECH LANGUAGE PATHOLOGY ONCOLOGY TREATMENT   Patient Name: Randy Kaufman MRN: [id number] DOB:05/13/1960, 63 y.o., male Today's Date: 07/02/2023  PCP: Caffie Damme REFERRING PROVIDER: Lonie Peak  END OF SESSION:  End of Session - 07/02/23 1047     Visit Number 2    Number of Visits 7    Date for SLP Re-Evaluation 08/25/23    SLP Start Time 1018    SLP Stop Time  1047    SLP Time Calculation (min) 29 min    Activity Tolerance Patient tolerated treatment well              Past Medical History:  Diagnosis Date   Cancer (HCC)    neck and throat   Past Surgical History:  Procedure Laterality Date   CHOLECYSTECTOMY     HERNIA REPAIR     IR GASTROSTOMY TUBE MOD SED  05/07/2023   IR IMAGING GUIDED PORT INSERTION  05/07/2023   IR NASO G TUBE PLC W/FL W/RAD  05/07/2023   Patient Active Problem List   Diagnosis Date Noted   Gout involving toe of left foot 06/24/2023   Hypomagnesemia 06/24/2023   Leukopenia due to antineoplastic chemotherapy (HCC) 06/10/2023   Chemotherapy-induced thrombocytopenia 06/10/2023   Cellulitis of right foot 05/20/2023   Port-A-Cath in place 05/13/2023   Encounter for antineoplastic chemotherapy 04/22/2023   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 Hx: He presented to his dentist last year with c/o a small lump to his right neck. He was referred to Dr. Pollyann Kennedy.04/02/23 Dr. Pollyann Kennedy noted a palpable 4 cm right level 2 neck mass. No other masses were palpated. He collected a  biopsy that day that showed findings consistent with metastatic SCC, p 16 status not obtained due to not enough material. 04/15/23 PET revealing hypermetabolic activity at the right lingual tonsil and right level II node. The enlarged lymph appeared to measure 4-5 cm in greatest dimension. No evidence of metastatic disease was seen. 04/20/23 Consult with Dr. Basilio Cairo and 04/22/23 consult with Dr. Arlana Pouch. He will receive chemotherapy with radiation.  Treatment plan:  He will receive 35 fractions of radiation to his Right tonsil and bilateral neck with weekly chemotherapy which started on 2/3 and will complete 3/21. Pretreatment procedures: 05/07/23 PEG/PAC  PAIN:  Are you having pain?  Yes: NPRS scale: 5/10 Pain location: lt foot Pain description: sore Aggravating factors: elevation Relieving factors: banging it into something  FALLS: Has patient fallen in last 6 months?  No   PATIENT GOALS: Maintain WNL swallowing  OBJECTIVE:  Note: Objective measures were completed at Evaluation unless otherwise noted.  TREATMENT DATE:   07/02/23: Pt is having water by mouth now- one week from last day of rad tx. Is going to try chicken noodle soup today - SLP suggested blended up. Education today about food journal, eating food that is tolerated. Pt performed HEP with independence. He told SLP rationale for HEP with independence.  05/27/23 (eval): Research states the risk for dysphagia increases due to radiation and/or chemotherapy treatment due to a variety of factors, so SLP educated the pt about the possibility of reduced/limited ability for PO intake during rad tx. SLP also educated pt regarding possible changes to swallowing musculature after rad tx, and why adherence to dysphagia HEP provided today and PO consumption was necessary to inhibit muscle fibrosis following rad tx and to mitigate  muscle disuse atrophy. SLP informed pt why this would be detrimental to their swallowing status and to their pulmonary health. Pt demonstrated understanding of these things to SLP. SLP encouraged pt to safely eat and drink as deep into their radiation/chemotherapy as possible to provide the best possible long-term swallowing outcome for pt.  SLP then developed an individualized HEP for pt involving oral and pharyngeal strengthening and ROM and pt was instructed how to perform these exercises, including SLP demonstration. After SLP demonstration, pt return demonstrated each exercise. SLP ensured pt performance was correct prior to educating pt on next exercise. Pt required usual demo cues faded to modified independent to perform HEP. Pt was instructed to complete this program 6-7 days/week, at least 20 reps a day until 6 months after his or her last day of rad tx, and then x2 a week after that, indefinitely. Among other modifications for days when pt cannot functionally swallow, SLP also suggested pt to perform only non-swallowing tasks on the handout/HEP, and if necessary to cycle through the swallowing portion so the full program of exercises can be completed instead of fatiguing on one of the swallowing exercises and being unable to perform the other swallowing exercises. SLP instructed that swallowing exercises should then be added back into the regimen as pt is able to do so.    PATIENT EDUCATION: Education details:  see "treatment today" Person educated: Patient and Spouse Education method: Explanation Education comprehension: verbalized understanding   ASSESSMENT:  CLINICAL IMPRESSION: Patient is a 63 y.o. M who was seen today for assessment of swallowing after undergoing chemoradiation therapy. Today pt drank thin liquids without overt s/s oral or pharyngeal difficulty. At this time pt swallowing is deemed WNL/WFL with these POs. No oral or overt s/sx pharyngeal deficits, including aspiration  were observed. There are no overt s/s aspiration PNA observed by SLP nor any reported by pt at this time. Data indicate that pt's swallow ability will likely decrease over the course of radiation/chemoradiation therapy and could very well decline over time following the conclusion of that therapy due to muscle disuse atrophy and/or muscle fibrosis. Pt will cont to need to be seen by SLP in order to assess safety of PO intake, assess the need for recommending any objective swallow assessment, and ensuring pt is correctly completing the individualized HEP.  OBJECTIVE IMPAIRMENTS: include dysphagia. These impairments are limiting patient from safety when swallowing. Factors affecting potential to achieve goals and functional outcome are  none . Patient will benefit from skilled SLP services to address above impairments and improve overall function.  REHAB POTENTIAL: Excellent   GOALS: Goals reviewed with patient? Generally, yes   SHORT TERM GOALS: Target: 3rd total session   1. Pt will  compelte HEP with modified independence in 2 sessions Baseline: 07/02/23 Goal status: INITIAL   2.  pt will tell SLP why pt is completing HEP with modified independence Baseline:  Goal status: met   3.  pt will describe 3 overt s/s aspiration PNA with modified independence Baseline:  Goal status: INITIAL   4.  pt will tell SLP how a food journal could ease return to a more normalized diet Baseline:  Goal status: met     LONG TERM GOALS: Target: 7th total session   1.  pt will complete HEP with independence over two visits Baseline:  Goal status: INITIAL   2.  pt will describe how to modify HEP over time, and the timeline associated with reduction in HEP frequency with modified independence over two sessions Baseline:  Goal status: INITIAL   PLAN:   SLP FREQUENCY:  once approx every 4 weeks   SLP DURATION:  7 sessions   PLANNED INTERVENTIONS: Aspiration precaution training, Pharyngeal  strengthening exercises, Diet toleration management , Trials of upgraded texture/liquids, SLP instruction and feedback, Compensatory strategies, and Patient/family education  [facility], CCC-SLP 07/02/2023, 10:48 AM

## 2023-07-03 ENCOUNTER — Other Ambulatory Visit: Payer: Self-pay

## 2023-07-08 NOTE — Progress Notes (Addendum)
   Randy Kaufman is here for a routine follow-up appointment today with Modesto Charon PA-C for surveillance of the right tonsil.  Treatment Completion Date: 2023-06-25  Pain issues, if any: He reports pain in right foot. He states that he has cellulitis and gout flare up. Using a feeding tube?: Yes, with no issues. Weight changes, if any:  Wt Readings from Last 3 Encounters: 07/13/2023      184.4 lb  07/01/23 190 lb 11.2 oz (86.5 kg)  06/24/23 191 lb 8 oz (86.9 kg)  06/17/23 193 lb 6.4 oz (87.7 kg)   Swallowing issues, if any: Denies  Smoking or chewing tobacco? Denies Using fluoride toothpaste daily? Yes Last ENT visit was on: December 2024 Other notable issues, if any: None  BP 121/68 (BP Location: Left Arm, Patient Position: Sitting, Cuff Size: Normal)   Pulse 66   Temp 97.7 F (36.5 C)   Resp 18   Ht 6' (1.829 m)   Wt 182 lb 6.4 oz (82.7 kg)   SpO2 100%   BMI 24.74 kg/m

## 2023-07-12 NOTE — Progress Notes (Signed)
 Radiation Oncology         [phone] ________________________________  Name: Randy Kaufman MRN: [id number]  Date: 07/13/2023  DOB: [birth year]  Follow-Up Visit Note  CC: Caffie Damme, MD  Caffie Damme, MD  Diagnosis and Prior Radiotherapy:    No diagnosis found.  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   CHIEF COMPLAINT:  Here for follow-up and surveillance of oropharyngeal cancer  Narrative:  The patient returns today for routine follow-up.  He completed his treatment on 06/25/2023.   ***                    ALLERGIES:  has no known allergies.  Meds: Current Outpatient Medications  Medication Sig Dispense Refill   Biotin 1000 MCG tablet Take 1,000 mcg by mouth 3 (three) times daily as needed (Dry mouth).     doxycycline (VIBRAMYCIN) 100 MG capsule Take 1 capsule (100 mg total) by mouth 2 (two) times daily. 20 capsule 0   finasteride (PROSCAR) 5 MG tablet Take 5 mg by mouth daily. Takes 1/2 Tab to equal 2.5 mg daily     HYDROcodone-acetaminophen (NORCO/VICODIN) 5-325 MG tablet Take 1 tablet by mouth every 6 (six) hours as needed. 60 tablet 0   lidocaine (XYLOCAINE) 2 % solution Patient: Mix 1part 2% viscous lidocaine, 1part H20. Swallow 10mL of diluted mixture, before meals and at bedtime, up to QID (Patient not taking: Reported on 06/24/2023) 200 mL 3   lidocaine-prilocaine (EMLA) cream Apply to affected area once 30 g 3   magnesium oxide (MAG-OX) 400 (240 Mg) MG tablet Take 1 tablet (400 mg total) by mouth daily. 30 tablet 1   Nutritional Supplements  (FEEDING SUPPLEMENT, [employer] STANDARD ENT 1.4,) LIQD liquid 6 cartons Molli Posey 1.4/equivalent - give one and one half cartons (488 ml) via tube QID. Flush with 60 ml water before and after each bolus. Drink or give additional 830 ml water (3.5 cups) to meet hydration needs. Provides 1950 ml/day, 2730 kcal, 120 g protein, 1385 ml free water from formula (2695 ml total water with flushes). Meets 100% DRI     ondansetron (ZOFRAN) 8 MG tablet Take 1 tablet (8 mg total) by mouth every 8 (eight) hours as needed for nausea or vomiting. Start on the third day after cisplatin. 30 tablet 1   pantoprazole (PROTONIX) 40 MG tablet Take 1 tablet (40 mg total) by mouth daily. (Patient not taking: Reported on 06/24/2023) 30 tablet 3   predniSONE (DELTASONE) 20 MG tablet Take 2 tablets by mouth daily until further instructions 40 tablet 1   prochlorperazine (COMPAZINE) 10 MG tablet Take 1 tablet (10 mg total) by mouth every 6 (six) hours as needed (Nausea or vomiting). (Patient not taking: Reported on 06/10/2023) 30 tablet 1   tadalafil (CIALIS) 20 MG tablet Take 20 mg by mouth daily as needed. (Patient not taking: Reported on 06/24/2023)     traMADol (ULTRAM) 50 MG tablet Take 1 tablet (50 mg total) by mouth every 6 (six) hours as needed. (Patient not taking: Reported on 06/24/2023) 60 tablet 0   No current facility-administered medications for this  encounter.    Physical Findings: The patient is in no acute distress. Patient is alert and oriented. Wt Readings from Last 3 Encounters:  07/01/23 190 lb 11.2 oz (86.5 kg)  06/24/23 191 lb 8 oz (86.9 kg)  06/17/23 193 lb 6.4 oz (87.7 kg)    vitals were not taken for this visit. .  General: Alert and oriented, in no acute distress HEENT: Head is normocephalic. Extraocular movements are intact. Oropharynx is notable for *** Neck: Neck is notable for *** Skin: Skin in treatment fields shows satisfactory healing *** Heart: Regular in rate and rhythm with no murmurs,  rubs, or gallops. Chest: Clear to auscultation bilaterally, with no rhonchi, wheezes, or rales. Abdomen: Soft, nontender, nondistended, with no rigidity or guarding. Extremities: No cyanosis or edema. Lymphatics: see Neck Exam Psychiatric: Judgment and insight are intact. Affect is appropriate.   Lab Findings: Lab Results  Component Value Date   WBC 4.3 07/01/2023   HGB 9.3 (L) 07/01/2023   HCT 27.0 (L) 07/01/2023   MCV 90.6 07/01/2023   PLT 312 07/01/2023    Lab Results  Component Value Date   TSH 0.822 04/22/2023    Radiographic Findings: VAS [place] LOWER EXTREMITY VENOUS (DVT) (7a-7p) Result Date: 06/22/2023  Lower Venous DVT Study Patient Name:  MADDEN GARRON  Date of Exam:   06/21/2023 Medical Rec #: [id number]          Accession #:    [id number] Date of Birth: [birth year]           Patient Gender: M Patient Age:   63 years Exam Location:  [facility] Procedure:      VAS [place] LOWER EXTREMITY VENOUS (DVT) Referring Phys: MELANIE BELFI --------------------------------------------------------------------------------  Indications: Pain.  Risk Factors: Cancer Carcinoma. Comparison Study: No significant changes seen since previous exam 05/20/23. Performing Technologist: Shona Simpson  Examination Guidelines: A complete evaluation includes B-mode imaging, spectral Doppler, color Doppler, and power Doppler as needed of all accessible portions of each vessel. Bilateral testing is considered an integral part of a complete examination. Limited examinations for reoccurring indications may be performed as noted. The reflux portion of the exam is performed with the patient in reverse Trendelenburg.  +---------+---------------+---------+-----------+----------+--------------+ RIGHT    CompressibilityPhasicitySpontaneityPropertiesThrombus Aging +---------+---------------+---------+-----------+----------+--------------+ CFV      Full           Yes      Yes                                  +---------+---------------+---------+-----------+----------+--------------+ SFJ      Full                                                        +---------+---------------+---------+-----------+----------+--------------+ FV Prox  Full                                                        +---------+---------------+---------+-----------+----------+--------------+ FV Mid   Full                                                        +---------+---------------+---------+-----------+----------+--------------+  FV DistalFull                                                        +---------+---------------+---------+-----------+----------+--------------+ PFV      Full                                                        +---------+---------------+---------+-----------+----------+--------------+ POP      Full           Yes      Yes                                 +---------+---------------+---------+-----------+----------+--------------+ PTV      Full                                                        +---------+---------------+---------+-----------+----------+--------------+ PERO     Full                                                        +---------+---------------+---------+-----------+----------+--------------+   +----+---------------+---------+-----------+----------+--------------+ LEFTCompressibilityPhasicitySpontaneityPropertiesThrombus Aging +----+---------------+---------+-----------+----------+--------------+ CFV Full           Yes      Yes                                 +----+---------------+---------+-----------+----------+--------------+     Summary: RIGHT: - There is no evidence of deep vein thrombosis in the lower extremity.  - No cystic structure found in the popliteal fossa.  LEFT: - No evidence of common femoral vein obstruction.   *See table(s) above for measurements and observations. Electronically signed by Lemar Livings MD  on 06/22/2023 at 3:53:32 PM.    Final    DG Foot Complete Right Result Date: 06/21/2023 CLINICAL DATA:  Right foot pain EXAM: RIGHT FOOT COMPLETE - 3+ VIEW COMPARISON:  None Available. FINDINGS: There is no evidence of fracture or dislocation. There is no evidence of arthropathy or other focal bone abnormality. Soft tissues are unremarkable. IMPRESSION: Negative. Electronically Signed   By: Minerva Fester M.D.   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 He is scheduled to see nutrition later today. PEG tube: ***  3) Risk Factors: The patient has been educated about risk factors including alcohol and tobacco abuse; they understand that avoidance of alcohol and tobacco is important to prevent recurrences as well as other cancers  4) Swallowing: ***. He is scheduled to see SLP on 07/30/2023.   5) Dental: Encouraged to continue regular followup with dentistry,  and dental hygiene including fluoride rinses. ***  6) Thyroid function:  Lab Results  Component Value Date   TSH 0.822 04/22/2023    7) Other: ***  8) PET scan in 2.5 months with an appointment visit to follow. Patient will follow-up with Dr. Arlana Pouch on 07/15/2023.   On date of service, in total, I spent *** minutes on this encounter. Patient was seen in person. _____________________________________    Bryan Lemma, PA-C

## 2023-07-13 ENCOUNTER — Inpatient Hospital Stay: Attending: Oncology | Admitting: Dietician

## 2023-07-13 ENCOUNTER — Encounter: Payer: Self-pay | Admitting: Radiology

## 2023-07-13 ENCOUNTER — Ambulatory Visit
Admission: RE | Admit: 2023-07-13 | Discharge: 2023-07-13 | Disposition: A | Discharge status: Home / Self Care | Source: Ambulatory Visit | Attending: Radiology | Admitting: Radiology

## 2023-07-13 VITALS — BP 121/68 | HR 66 | Temp 97.7°F | Resp 18 | Ht 72.0 in | Wt 182.4 lb

## 2023-07-13 DIAGNOSIS — Z923 Personal history of irradiation: Secondary | ICD-10-CM | POA: Insufficient documentation

## 2023-07-13 DIAGNOSIS — Z5111 Encounter for antineoplastic chemotherapy: Secondary | ICD-10-CM | POA: Insufficient documentation

## 2023-07-13 DIAGNOSIS — R200 Anesthesia of skin: Secondary | ICD-10-CM | POA: Insufficient documentation

## 2023-07-13 DIAGNOSIS — Z7952 Long term (current) use of systemic steroids: Secondary | ICD-10-CM | POA: Diagnosis not present

## 2023-07-13 DIAGNOSIS — L03116 Cellulitis of left lower limb: Secondary | ICD-10-CM | POA: Insufficient documentation

## 2023-07-13 DIAGNOSIS — Z79899 Other long term (current) drug therapy: Secondary | ICD-10-CM | POA: Diagnosis not present

## 2023-07-13 DIAGNOSIS — R202 Paresthesia of skin: Secondary | ICD-10-CM | POA: Insufficient documentation

## 2023-07-13 DIAGNOSIS — C024 Malignant neoplasm of lingual tonsil: Secondary | ICD-10-CM | POA: Insufficient documentation

## 2023-07-13 DIAGNOSIS — Z792 Long term (current) use of antibiotics: Secondary | ICD-10-CM | POA: Insufficient documentation

## 2023-07-13 DIAGNOSIS — M10072 Idiopathic gout, left ankle and foot: Secondary | ICD-10-CM | POA: Insufficient documentation

## 2023-07-13 DIAGNOSIS — R599 Enlarged lymph nodes, unspecified: Secondary | ICD-10-CM | POA: Insufficient documentation

## 2023-07-13 DIAGNOSIS — L03115 Cellulitis of right lower limb: Secondary | ICD-10-CM | POA: Insufficient documentation

## 2023-07-13 DIAGNOSIS — E860 Dehydration: Secondary | ICD-10-CM | POA: Insufficient documentation

## 2023-07-13 MED ORDER — SULFAMETHOXAZOLE-TRIMETHOPRIM 800-160 MG PO TABS: 1.0000 | ORAL_TABLET | Freq: Two times a day (BID) | ORAL | 0 refills | Status: DC

## 2023-07-13 NOTE — Progress Notes (Signed)
 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 He is receiving concurrent chemoradiation (Final RT/chemo 3/21). Patient is under the care of Dr. Basilio Cairo and Dr. Arlana Pouch.    S/p PEG 1/31  Met with patient in radiation clinic after follow-up with PA-C. Patient in wheelchair today. Reports recurrent cellulitis of foot. Patient tolerating small amounts of soft foods by mouth. Does well with eggs and grits. He tried ice cream a few weeks ago, however this did not taste good. Tried cake for his birthday over weekend. This was horrible. He is relying on tube. Recalls 3-4 [facility] via tube. He adds whole milk to speed up flow of formula. Patient tolerating well. Patient reports completing swallowing exercises at every feeding.   Medications: reviewed   Labs: no new labs   Anthropometrics: Wt 182 lb 6.4 oz today decreased ~5% in 2 weeks which is severe  3/21 - 191 lb 8 oz 3/14 - 193 lb 6.4 oz  3/6 - 195 lb  2/28 - 198 lb 8 oz  2/21 - 205 lb 4 oz  2/14 - 206 lb 12 oz   Estimated Energy Needs  Kcals: 2700-3000 Protein: 122-140 Fluid: >2.7 L  NUTRITION DIAGNOSIS: Unintended wt loss continues    INTERVENTION:  Encourage daily po trials of soft moist foods Continue HEP as prescribed per SLP Continue [employer] 1.4 - recommend increasing to 5 cartons to minimize further wt loss Will continue working with patient to assess readiness to wean from tube Educated on [facility] support group    MONITORING, EVALUATION, GOAL: wt trends, intake, TF   NEXT VISIT: Tuesday April 29 via telephone

## 2023-07-14 ENCOUNTER — Other Ambulatory Visit: Payer: Self-pay

## 2023-07-14 ENCOUNTER — Other Ambulatory Visit: Payer: Self-pay | Admitting: Radiation Oncology

## 2023-07-14 ENCOUNTER — Other Ambulatory Visit: Payer: Self-pay | Admitting: Radiology

## 2023-07-14 DIAGNOSIS — L03115 Cellulitis of right lower limb: Secondary | ICD-10-CM

## 2023-07-14 DIAGNOSIS — R2242 Localized swelling, mass and lump, left lower limb: Secondary | ICD-10-CM

## 2023-07-14 NOTE — Addendum Note (Signed)
 Encounter addended by: Erven Colla, PA-C on: 07/14/2023 4:03 PM  Actions taken: Clinical Note Signed

## 2023-07-15 ENCOUNTER — Ambulatory Visit (HOSPITAL_COMMUNITY)

## 2023-07-15 ENCOUNTER — Inpatient Hospital Stay (HOSPITAL_BASED_OUTPATIENT_CLINIC_OR_DEPARTMENT_OTHER): Admitting: Oncology

## 2023-07-15 ENCOUNTER — Inpatient Hospital Stay

## 2023-07-15 DIAGNOSIS — R599 Enlarged lymph nodes, unspecified: Secondary | ICD-10-CM | POA: Diagnosis not present

## 2023-07-15 DIAGNOSIS — Z95828 Presence of other vascular implants and grafts: Secondary | ICD-10-CM

## 2023-07-15 DIAGNOSIS — E860 Dehydration: Secondary | ICD-10-CM | POA: Diagnosis not present

## 2023-07-15 DIAGNOSIS — M10072 Idiopathic gout, left ankle and foot: Secondary | ICD-10-CM | POA: Diagnosis not present

## 2023-07-15 DIAGNOSIS — Z7952 Long term (current) use of systemic steroids: Secondary | ICD-10-CM | POA: Diagnosis not present

## 2023-07-15 DIAGNOSIS — Z792 Long term (current) use of antibiotics: Secondary | ICD-10-CM | POA: Diagnosis not present

## 2023-07-15 DIAGNOSIS — R202 Paresthesia of skin: Secondary | ICD-10-CM | POA: Diagnosis not present

## 2023-07-15 DIAGNOSIS — L03115 Cellulitis of right lower limb: Secondary | ICD-10-CM | POA: Diagnosis not present

## 2023-07-15 DIAGNOSIS — Z5111 Encounter for antineoplastic chemotherapy: Secondary | ICD-10-CM | POA: Diagnosis present

## 2023-07-15 DIAGNOSIS — G893 Neoplasm related pain (acute) (chronic): Secondary | ICD-10-CM | POA: Diagnosis not present

## 2023-07-15 DIAGNOSIS — Z923 Personal history of irradiation: Secondary | ICD-10-CM | POA: Diagnosis not present

## 2023-07-15 DIAGNOSIS — Z79899 Other long term (current) drug therapy: Secondary | ICD-10-CM | POA: Diagnosis not present

## 2023-07-15 DIAGNOSIS — C024 Malignant neoplasm of lingual tonsil: Secondary | ICD-10-CM

## 2023-07-15 DIAGNOSIS — L03119 Cellulitis of unspecified part of limb: Secondary | ICD-10-CM | POA: Diagnosis not present

## 2023-07-15 DIAGNOSIS — L03116 Cellulitis of left lower limb: Secondary | ICD-10-CM | POA: Diagnosis not present

## 2023-07-15 DIAGNOSIS — R200 Anesthesia of skin: Secondary | ICD-10-CM | POA: Diagnosis not present

## 2023-07-15 LAB — CMP (CANCER CENTER ONLY)
ALT: 16 U/L (ref 0–44)
AST: 16 U/L (ref 15–41)
Albumin: 4 g/dL (ref 3.5–5.0)
Alkaline Phosphatase: 53 U/L (ref 38–126)
Anion gap: 6 (ref 5–15)
BUN: 30 mg/dL — ABNORMAL HIGH (ref 8–23)
CO2: 27 mmol/L (ref 22–32)
Calcium: 9.6 mg/dL (ref 8.9–10.3)
Chloride: 102 mmol/L (ref 98–111)
Creatinine: 1.54 mg/dL — ABNORMAL HIGH (ref 0.61–1.24)
GFR, Estimated: 50 mL/min — ABNORMAL LOW (ref >60)
Glucose, Bld: 114 mg/dL — ABNORMAL HIGH (ref 70–99)
Potassium: 4.6 mmol/L (ref 3.5–5.1)
Sodium: 135 mmol/L (ref 135–145)
Total Bilirubin: 0.5 mg/dL (ref 0.0–1.2)
Total Protein: 7.2 g/dL (ref 6.5–8.1)

## 2023-07-15 LAB — CBC WITH DIFFERENTIAL (CANCER CENTER ONLY)
Abs Immature Granulocytes: 0.04 10*3/uL (ref 0.00–0.07)
Basophils Absolute: 0 10*3/uL (ref 0.0–0.1)
Basophils Relative: 0 %
Eosinophils Absolute: 0 10*3/uL (ref 0.0–0.5)
Eosinophils Relative: 0 %
HCT: 27.9 % — ABNORMAL LOW (ref 39.0–52.0)
Hemoglobin: 9.8 g/dL — ABNORMAL LOW (ref 13.0–17.0)
Immature Granulocytes: 1 %
Lymphocytes Relative: 2 %
Lymphs Abs: 0.1 10*3/uL — ABNORMAL LOW (ref 0.7–4.0)
MCH: 32 pg (ref 26.0–34.0)
MCHC: 35.1 g/dL (ref 30.0–36.0)
MCV: 91.2 fL (ref 80.0–100.0)
Monocytes Absolute: 0.3 10*3/uL (ref 0.1–1.0)
Monocytes Relative: 5 %
Neutro Abs: 5.3 10*3/uL (ref 1.7–7.7)
Neutrophils Relative %: 92 %
Platelet Count: 153 10*3/uL (ref 150–400)
RBC: 3.06 MIL/uL — ABNORMAL LOW (ref 4.22–5.81)
RDW: 15.8 % — ABNORMAL HIGH (ref 11.5–15.5)
WBC Count: 5.8 10*3/uL (ref 4.0–10.5)
nRBC: 0 % (ref 0.0–0.2)

## 2023-07-15 LAB — MAGNESIUM: Magnesium: 2.1 mg/dL (ref 1.7–2.4)

## 2023-07-15 MED ORDER — HYDROCODONE-ACETAMINOPHEN 5-325 MG PO TABS: 1.0000 | ORAL_TABLET | Freq: Four times a day (QID) | ORAL | 0 refills | Status: DC | PRN

## 2023-07-15 MED ORDER — SODIUM CHLORIDE 0.9% FLUSH
10.0000 mL | Freq: Once | INTRAVENOUS | Status: AC
  Administered 2023-07-15: 10 mL

## 2023-07-15 MED ORDER — HEPARIN SOD (PORK) LOCK FLUSH 100 UNIT/ML IV SOLN
500.0000 [IU] | Freq: Once | INTRAVENOUS | Status: AC
  Administered 2023-07-15: 500 [IU]

## 2023-07-16 NOTE — Progress Notes (Signed)
 [facility]  ONCOLOGY CLINIC PROGRESS NOTE   Patient Care Team: Bertrum Brodie, MD as PCP - General (Family Medicine) Colie Dawes, MD as Attending Physician (Radiation Oncology) Janita Mellow, MD as Consulting Physician (Otolaryngology) Malmfelt, Nancyann Aye, RN as Oncology Nurse Navigator Arlo Berber, MD as Consulting Physician (Oncology)  PATIENT NAME: Randy Kaufman   MR#: [id number] DOB: [birth year]  Date of visit: 07/15/2023   ASSESSMENT & PLAN:   Randy Kaufman is a 63 y.o.  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 Unknown p16 status.   Lingual tonsil carcinoma (HCC) - Please review oncology history for additional details and timeline of events.  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 Unknown p16 status.  Clinically cT2,cN1,cM0.  There is concern for extracapsular extension based on imaging.  Discussed his case in tumor conference on 04/21/2023. Given the size and nature of the lymph node with concern for extracapsular extension, he has a high likelihood of requiring adjuvant chemoradiation if removed surgically.  Hence consensus opinion is to proceed with concurrent chemoradiation.  It was decided to not pursue another biopsy in an attempt to determine p16 status, since the treatment plan would not change.  -Previously I discussed tumor board recommendations with the patient.  He was agreeable to proceeding with concurrent chemoradiation. We have discussed about role of cisplatin being a radiosensitizer in the treatment of head and neck cancer.  We have discussed about the curative intent of chemoradiation for this patient. Patient was willing to proceed with weekly cisplatin.   Cisplatin will be given at 40 mg/m IV dose weekly during the course of radiation.  He started radiation from 05/10/2023.  Started chemotherapy with cisplatin from 05/14/2023 and completed dose #7 on 06/25/2023.  Because of cytopenias, we started dose reducing cisplatin to 30 mg/m starting from cycle 5 onwards.  He is recovering well from chemoradiation related side effects.  Going forward we will continue surveillance as per NCCN guidelines.   Cellulitis of right foot Ultrasound of the right lower extremity on 05/20/2023 showed no evidence of DVT.   He did have signs of cellulitis in the right foot.  Started him on empiric antibiotic course with Bactrim DS tablet twice daily for 10 days.  Cellulitis resolved with this intervention.  Started having pain and redness of the right foot again from 06/21/2023.  He was directed to the ED and ultrasound showed no signs of DVT.  X-rays were negative for any fractures.  He was started on doxycycline course.  He completed course.   Recurrent cellulitis of the left foot with persistent erythema and edema. Currently on Bactrim (sulfamethoxazole) twice daily again, started earlier this week.    He is also on prednisone, tapered to 20 mg once daily.  Infectious etiology suspected; referral to infectious disease specialists planned for further evaluation. Denies systemic symptoms such as fever, chills, or night sweats. Discussed potential use of low-dose antibiotics as maintenance therapy to prevent recurrence, pending infectious disease evaluation. No evidence of circulatory issues; pulses are normal. Pain managed with hydrocodone at night.  - Refer to infectious disease specialists for further evaluation and management.  - Continue Bactrim (sulfamethoxazole) twice daily to complete course.  - Taper prednisone to half a tablet daily for one week, then quarter tablet  for another week, and then discontinue.  - Encourage hydration to improve renal  function.  Gout Gout with residual nodules on the left foot. Current cellulitis episode may be related to or exacerbated by underlying gout. Re-establish care with primary care physician for further gout management. - Re-establish care with primary care physician for gout management.  Dehydration Signs of dehydration with elevated creatinine level of 1.5 mg/dL. Reports drinking 48 to 64 ounces of water daily; advised to increase fluid intake to at least 64 to 70 ounces per day to improve hydration and renal function. - Increase fluid intake to at least 64 to 70 ounces per day.    I reviewed lab results and outside records for this visit and discussed relevant results with the patient. Diagnosis, plan of care and treatment options were also discussed in detail with the patient. Opportunity provided to ask questions and answers provided to his apparent satisfaction. Provided instructions to call our clinic with any problems, questions or concerns prior to return visit. I recommended to continue follow-up with PCP and sub-specialists. He verbalized understanding and agreed with the plan.   NCCN guidelines have been consulted in the planning of this patient's care.  I spent a total of 40 minutes during this encounter with the patient including review of chart and various tests results, discussions about plan of care and coordination of care plan.   Rowen Wilmer, MD   [facility] - A DEPT OF [facility] [address] [place] [address] Dept: [phone] Dept Fax: [phone]    CHIEF COMPLAINT/ REASON FOR VISIT:   Squamous cell carcinoma consistent with a right lingual tonsillar primary with a metastatic ipsilateral cervical node. Clinically cT2,cN1,cM0.   Current Treatment: Concurrent chemoradiation with weekly cisplatin started during the week of 05/10/2023.  INTERVAL HISTORY:    Discussed the use of AI scribe  software for clinical note transcription with the patient, who gave verbal consent to proceed.  History of Present Illness Randy Kaufman is a 63 year old male with recu male with recurrent gout, cellulitis and leg swelling who presents with worsening symptoms in the left foot.  He experiences worsening symptoms of gout and swelling in the left foot, with redness and a persistent knot. Previously localized to the toe, the gout has now spread. He started antibiotics the evening before yesterday, taking them twice daily, but feels a previous antibiotic was more effective. No fevers, chills, or night sweats.  Pain typically occurs at night and is alleviated by hydrocodone, which helps with discomfort from the covers and aids in sleep. He has been able to walk better today and uses alternating cold and heat packs to manage swelling.  He reports occasional tingling and numbness in his fingers, described as a passing sensation. He eats soft solid foods without difficulty and denies nausea or vomiting.  He is currently taking Bactrim (sulfamethoxazole) twice a day, prednisone 20 mg once a day, and magnesium supplements. He uses hydrocodone for pain management at night. He mentions increased activity, such as mowing the grass, on the day symptoms worsened. He drinks 48 to 64 ounces of water daily, especially after protein tube feedings, to maintain hydration.   I have reviewed the past medical history, past surgical history, social history and family history with the patient and they are unchanged from previous note.  HISTORY OF PRESENT ILLNESS:   ONCOLOGY HISTORY:   He presented to his dentist last year with  c/o of a small lump to his right neck. He was subsequently referred to Dr. Donalee Fruits at [facility], on 04/02/23 for further evaluation. Physical exam performed at that time noted a palpable 4 cm right level 2 neck mass. No other masses were palpated.    A biopsy of the right neck mass was collected that date  (04/02/23) and showed findings consistent with metastatic squamous cell carcinoma.  There was insufficient tissue to check for p16 status.   Staging PET scan on 04/14/22 revealed hypermetabolic activity at the right lingual tonsil and right level II node. The enlarged lymph node appeared to measure approximately 2.6 cm in greatest dimension. No evidence of distant metastatic disease was seen.    He later had CT soft [facility] neck at [facility].   Unknown p16 status.  Clinically cT2,cN1,cM0.  There is concern for extracapsular extension based on imaging.   Discussed his case in tumor conference on 04/21/2023. Given the size and nature of the lymph node with concern for extracapsular extension, he has a high likelihood of requiring adjuvant chemoradiation if removed surgically.  Hence consensus opinion was to proceed with concurrent chemoradiation. It was decided to not pursue another biopsy in an attempt to determine p16 status, since the treatment plan would not change.  He started radiation treatments from 05/10/2023.  Started chemotherapy with cisplatin from 05/14/2023.  Oncology History  Lingual tonsil carcinoma (HCC)  04/20/2023 Initial Diagnosis   Lingual tonsil carcinoma (HCC)   04/22/2023 Cancer Staging   Staging form: Pharynx - HPV-Mediated Oropharynx, AJCC 8th Edition - Clinical: Stage I (cT2, cN1, cM0, p16: Unknown, HPV: Unknown) - Signed by Arlo Berber, MD on 04/22/2023   05/14/2023 -  Chemotherapy   Patient is on Treatment Plan : HEAD/NECK Cisplatin (40) q7d         REVIEW OF SYSTEMS:   Review of Systems - Oncology  All other pertinent systems were reviewed with the patient and are negative.  ALLERGIES: He has no known allergies.  MEDICATIONS:  Current Outpatient Medications  Medication Sig Dispense Refill   finasteride (PROSCAR) 5 MG tablet Take 5 mg by mouth daily. Takes 1/2 Tab to equal 2.5 mg daily     lidocaine-prilocaine (EMLA) cream Apply to affected area  once 30 g 3   Nutritional Supplements (FEEDING SUPPLEMENT, KATE [employer]ARMS STANDARD ENT 1.4,) LIQD liquid 6 cartons Johny Nap 1.4/equivalent - give one and one half cartons (488 ml) via tube QID. Flush with 60 ml water before and after each bolus. Drink or give additional 830 ml water (3.5 cups) to meet hydration needs. Provides 1950 ml/day, 2730 kcal, 120 g protein, 1385 ml free water from formula (2695 ml total water with flushes). Meets 100% DRI     predniSONE (DELTASONE) 20 MG tablet Take 2 tablets by mouth daily until further instructions 40 tablet 1   prochlorperazine (COMPAZINE) 10 MG tablet Take 1 tablet (10 mg total) by mouth every 6 (six) hours as needed (Nausea or vomiting). 30 tablet 1   sulfamethoxazole-trimethoprim (BACTRIM DS) 800-160 MG tablet Take 1 tablet by mouth 2 (two) times daily. 20 tablet 0   Biotin 1000 MCG tablet Take 1,000 mcg by mouth 3 (three) times daily as needed (Dry mouth). (Patient not taking: Reported on 07/15/2023)     HYDROcodone-acetaminophen (NORCO/VICODIN) 5-325 MG tablet Take 1 tablet by mouth every 6 (six) hours as needed. 60 tablet 0   lidocaine (XYLOCAINE) 2 % solution Patient: Mix 1part 2% viscous lidocaine, 1part H20.  Swallow 10mL of diluted mixture, before meals and at bedtime, up to QID (Patient not taking: Reported on 07/15/2023) 200 mL 3   magnesium oxide (MAG-OX) 400 (240 Mg) MG tablet Take 1 tablet (400 mg total) by mouth daily. (Patient not taking: Reported on 07/15/2023) 30 tablet 1   ondansetron (ZOFRAN) 8 MG tablet Take 1 tablet (8 mg total) by mouth every 8 (eight) hours as needed for nausea or vomiting. Start on the third day after cisplatin. (Patient not taking: Reported on 07/15/2023) 30 tablet 1   pantoprazole (PROTONIX) 40 MG tablet Take 1 tablet (40 mg total) by mouth daily. (Patient not taking: Reported on 07/15/2023) 30 tablet 3   tadalafil (CIALIS) 20 MG tablet Take 20 mg by mouth daily as needed. (Patient not taking: Reported on 07/15/2023)      traMADol (ULTRAM) 50 MG tablet Take 1 tablet (50 mg total) by mouth every 6 (six) hours as needed. (Patient not taking: Reported on 07/15/2023) 60 tablet 0   No current facility-administered medications for this visit.     VITALS:   There were no vitals taken for this visit.  Wt Readings from Last 3 Encounters:  07/13/23 182 lb 6.4 oz (82.7 kg)  07/01/23 190 lb 11.2 oz (86.5 kg)  06/24/23 191 lb 8 oz (86.9 kg)    There is no height or weight on file to calculate BMI.    Onc Performance Status - 07/15/23 1159       ECOG Perf Status   ECOG Perf Status Fully active, able to carry on all pre-disease performance without restriction      KPS SCALE   KPS % SCORE Normal activity with effort, some s/s of disease                PHYSICAL EXAM:   Physical Exam Constitutional:      General: He is not in acute distress.    Appearance: Normal appearance.  HENT:     Head: Normocephalic and atraumatic.     Mouth/Throat:     Comments: Slight erythema in the back of the throat Eyes:     General: No scleral icterus.    Conjunctiva/sclera: Conjunctivae normal.  Cardiovascular:     Rate and Rhythm: Normal rate and regular rhythm.     Pulses: Normal pulses.     Heart sounds: Normal heart sounds.  Pulmonary:     Effort: Pulmonary effort is normal.     Breath sounds: Normal breath sounds.  Chest:     Comments: Right-sided Port-A-Cath in place, without any evidence of infection Abdominal:     General: There is no distension.     Comments: Feeding tube in place.  No signs of infection.  Musculoskeletal:     Comments: Erythema resolved in the right foot.    Currently has erythema involving the left foot with some swelling around the ankle joint.  No tenderness like before.   No purple discoloration now.  Distal pulses intact bilaterally  Lymphadenopathy:     Cervical: Cervical adenopathy (firm, fixed, right sided LN, improved compared to prior) present.  Neurological:      General: No focal deficit present.     Mental Status: He is alert and oriented to person, place, and time.  Psychiatric:        Mood and Affect: Mood normal.        Behavior: Behavior normal.        Thought Content: Thought content normal.  LABORATORY DATA:   I have reviewed the data as listed.  Results for orders placed or performed in visit on 07/15/23  CBC with Differential (Cancer Center Only)  Result Value Ref Range   WBC Count 5.8 4.0 - 10.5 K/uL   RBC 3.06 (L) 4.22 - 5.81 MIL/uL   Hemoglobin 9.8 (L) 13.0 - 17.0 g/dL   HCT 16.1 (L) 09.6 - 04.5 %   MCV 91.2 80.0 - 100.0 fL   MCH 32.0 26.0 - 34.0 pg   MCHC 35.1 30.0 - 36.0 g/dL   RDW 40.9 (H) 81.1 - 91.4 %   Platelet Count 153 150 - 400 K/uL   nRBC 0.0 0.0 - 0.2 %   Neutrophils Relative % 92 %   Neutro Abs 5.3 1.7 - 7.7 K/uL   Lymphocytes Relative 2 %   Lymphs Abs 0.1 (L) 0.7 - 4.0 K/uL   Monocytes Relative 5 %   Monocytes Absolute 0.3 0.1 - 1.0 K/uL   Eosinophils Relative 0 %   Eosinophils Absolute 0.0 0.0 - 0.5 K/uL   Basophils Relative 0 %   Basophils Absolute 0.0 0.0 - 0.1 K/uL   Immature Granulocytes 1 %   Abs Immature Granulocytes 0.04 0.00 - 0.07 K/uL  CMP (Cancer Center only)  Result Value Ref Range   Sodium 135 135 - 145 mmol/L   Potassium 4.6 3.5 - 5.1 mmol/L   Chloride 102 98 - 111 mmol/L   CO2 27 22 - 32 mmol/L   Glucose, Bld 114 (H) 70 - 99 mg/dL   BUN 30 (H) 8 - 23 mg/dL   Creatinine 7.82 (H) 9.56 - 1.24 mg/dL   Calcium 9.6 8.9 - 21.3 mg/dL   Total Protein 7.2 6.5 - 8.1 g/dL   Albumin 4.0 3.5 - 5.0 g/dL   AST 16 15 - 41 U/L   ALT 16 0 - 44 U/L   Alkaline Phosphatase 53 38 - 126 U/L   Total Bilirubin 0.5 0.0 - 1.2 mg/dL   GFR, Estimated 50 (L) >60 mL/min   Anion gap 6 5 - 15  Magnesium  Result Value Ref Range   Magnesium 2.1 1.7 - 2.4 mg/dL      RADIOGRAPHIC STUDIES:  I have personally reviewed the radiological images as listed and agree with the findings in the report.  VAS US   LOWER EXTREMITY VENOUS (DVT) (7a-7p) Result Date: 06/22/2023  Lower Venous DVT Study Patient Name:  LEAN FAYSON  Date of Exam:   06/21/2023 Medical Rec #: [id number]          Accession #:    [id number] Date of Birth: [birth year]           Patient Gender: M Patient Age:   33 years Exam [facility]  [facility] Procedure:      VAS US  LOWER EXTREMITY VENOUS (DVT) Referring Phys: MELANIE BELFI --------------------------------------------------------------------------------  Indications: Pain.  Risk Factors: Cancer Carcinoma. Comparison Study: No significant changes seen since previous exam 05/20/23. Performing Technologist: Estanislao Heimlich  Examination Guidelines: A complete evaluation includes B-mode imaging, spectral Doppler, color Doppler, and power Doppler as needed of all accessible portions of each vessel. Bilateral testing is considered an integral part of a complete examination. Limited examinations for reoccurring indications may be performed as noted. The reflux portion of the exam is performed with the patient in reverse Trendelenburg.  +---------+---------------+---------+-----------+----------+--------------+ RIGHT    CompressibilityPhasicitySpontaneityPropertiesThrombus Aging +---------+---------------+---------+-----------+----------+--------------+ CFV      Full  Yes      Yes                                 +---------+---------------+---------+-----------+----------+--------------+ SFJ      Full                                                        +---------+---------------+---------+-----------+----------+--------------+ FV Prox  Full                                                        +---------+---------------+---------+-----------+----------+--------------+ FV Mid   Full                                                        +---------+---------------+---------+-----------+----------+--------------+ FV DistalFull                                                         +---------+---------------+---------+-----------+----------+--------------+ PFV      Full                                                        +---------+---------------+---------+-----------+----------+--------------+ POP      Full           Yes      Yes                                 +---------+---------------+---------+-----------+----------+--------------+ PTV      Full                                                        +---------+---------------+---------+-----------+----------+--------------+ PERO     Full                                                        +---------+---------------+---------+-----------+----------+--------------+   +----+---------------+---------+-----------+----------+--------------+ LEFTCompressibilityPhasicitySpontaneityPropertiesThrombus Aging +----+---------------+---------+-----------+----------+--------------+ CFV Full           Yes      Yes                                 +----+---------------+---------+-----------+----------+--------------+     Summary: RIGHT: - There is no evidence of deep vein  thrombosis in the lower extremity.  - No cystic structure found in the popliteal fossa.  LEFT: - No evidence of common femoral vein obstruction.   *See table(s) above for measurements and observations. Electronically signed by Angela Kell MD on 06/22/2023 at 3:53:32 PM.    Final    DG Foot Complete Right Result Date: 06/21/2023 CLINICAL DATA:  Right foot pain EXAM: RIGHT FOOT COMPLETE - 3+ VIEW COMPARISON:  None Available. FINDINGS: There is no evidence of fracture or dislocation. There is no evidence of arthropathy or other focal bone abnormality. Soft tissues are unremarkable. IMPRESSION: Negative. Electronically Signed   By: Rozell Cornet M.D.   On: 06/21/2023 19:14    CODE STATUS:  Code Status History     Date Active Date Inactive Code Status Order ID Comments User Context   05/07/2023 1642  05/08/2023 0512 Full Code [id number]  Art Largo, MD [facility]   05/07/2023 1642 05/07/2023 1642 Full Code [id number]  Art Largo, MD [facility]    Questions for Most Recent Historical Code Status (Order [id number])     Question Answer   By: Consent: discussion documented in EHR            Orders Placed This Encounter  Procedures   CBC with Differential (Cancer Center Only)    Standing Status:   Future    Expected Date:   08/12/2023    Expiration Date:   07/14/2024   Basic Metabolic Panel - Cancer Center Only    Standing Status:   Future    Expected Date:   08/12/2023    Expiration Date:   07/14/2024   Magnesium    Standing Status:   Future    Expected Date:   08/12/2023    Expiration Date:   07/14/2024   Ambulatory referral to Infectious Disease    Referral Priority:   Urgent    Referral Type:   Consultation    Referral Reason:   Specialty Services Required    Referred to Provider:   Lina Render, MD    Requested Specialty:   Infectious Diseases    Number of Visits Requested:   1     Future Appointments  Date Time Provider Department Center  07/30/2023 10:15 AM Lilia Reges CCC-SLP [facility]  08/03/2023  2:30 PM Woody Heading, RD [facility] None  08/12/2023 11:15 AM [facility] FLUSH [facility] None  08/12/2023 11:45 AM Teneisha Gignac, Gale Jude, MD [facility] None  10/01/2023  2:00 PM Colie Dawes, MD [facility] None      This document was completed utilizing speech recognition software. Grammatical errors, random word insertions, pronoun errors, and incomplete sentences are an occasional consequence of this system due to software limitations, ambient noise, and hardware issues. Any formal questions or concerns about the content, text or information contained within the body of this dictation should be directly addressed to the provider for clarification.

## 2023-07-19 ENCOUNTER — Encounter: Payer: Self-pay | Admitting: Oncology

## 2023-07-20 ENCOUNTER — Encounter: Payer: Self-pay | Admitting: Oncology

## 2023-07-20 NOTE — Assessment & Plan Note (Signed)
 Ultrasound of the right lower extremity on 05/20/2023 showed no evidence of DVT.   He did have signs of cellulitis in the right foot.  Started him on empiric antibiotic course with Bactrim DS tablet twice daily for 10 days.  Cellulitis resolved with this intervention.  Started having pain and redness of the right foot again from 06/21/2023.  He was directed to the ED and ultrasound showed no signs of DVT.  X-rays were negative for any fractures.  He was started on doxycycline course.  He completed course.   Recurrent cellulitis of the left foot with persistent erythema and edema. Currently on Bactrim (sulfamethoxazole) twice daily again, started earlier this week.    He is also on prednisone, tapered to 20 mg once daily.  Infectious etiology suspected; referral to infectious disease specialists planned for further evaluation. Denies systemic symptoms such as fever, chills, or night sweats. Discussed potential use of low-dose antibiotics as maintenance therapy to prevent recurrence, pending infectious disease evaluation. No evidence of circulatory issues; pulses are normal. Pain managed with hydrocodone at night.  - Refer to infectious disease specialists for further evaluation and management.  - Continue Bactrim (sulfamethoxazole) twice daily to complete course.  - Taper prednisone to half a tablet daily for one week, then quarter tablet for another week, and then discontinue.  - Encourage hydration to improve renal function.

## 2023-07-20 NOTE — Assessment & Plan Note (Signed)
-   Please review oncology history for additional details and timeline of events.  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 Unknown p16 status.  Clinically cT2,cN1,cM0.  There is concern for extracapsular extension based on imaging.  Discussed his case in tumor conference on 04/21/2023. Given the size and nature of the lymph node with concern for extracapsular extension, he has a high likelihood of requiring adjuvant chemoradiation if removed surgically.  Hence consensus opinion is to proceed with concurrent chemoradiation.  It was decided to not pursue another biopsy in an attempt to determine p16 status, since the treatment plan would not change.  -Previously I discussed tumor board recommendations with the patient.  He was agreeable to proceeding with concurrent chemoradiation. We have discussed about role of cisplatin being a radiosensitizer in the treatment of head and neck cancer.  We have discussed about the curative intent of chemoradiation for this patient. Patient was willing to proceed with weekly cisplatin.  Cisplatin will be given at 40 mg/m IV dose weekly during the course of radiation.  He started radiation from 05/10/2023.  Started chemotherapy with cisplatin from 05/14/2023 and completed dose #7 on 06/25/2023.  Because of cytopenias, we started dose reducing cisplatin to 30 mg/m starting from cycle 5 onwards.  He is recovering well from chemoradiation related side effects.  Going forward we will continue surveillance as per NCCN guidelines.

## 2023-07-29 ENCOUNTER — Other Ambulatory Visit: Payer: Self-pay | Admitting: Oncology

## 2023-07-30 ENCOUNTER — Ambulatory Visit: Attending: Radiation Oncology

## 2023-07-30 DIAGNOSIS — R1310 Dysphagia, unspecified: Secondary | ICD-10-CM | POA: Insufficient documentation

## 2023-07-30 NOTE — Therapy (Signed)
 OUTPATIENT SPEECH LANGUAGE PATHOLOGY ONCOLOGY TREATMENT   Patient Name: Randy Kaufman MRN: [id number] DOB:01/28/1961, 63 y.o., male Today's Date: 07/30/2023  PCP: Bertrum Brodie REFERRING PROVIDER: Colie Dawes  END OF SESSION:  End of Session - 07/30/23 1103     Visit Number 3    Number of Visits 7    Date for SLP Re-Evaluation 08/25/23    SLP Start Time 1024    SLP Stop Time  1047    SLP Time Calculation (min) 23 min    Activity Tolerance Patient tolerated treatment well               Past Medical History:  Diagnosis Date   Cancer (HCC)    neck and throat   Past Surgical History:  Procedure Laterality Date   CHOLECYSTECTOMY     HERNIA REPAIR     IR GASTROSTOMY TUBE MOD SED  05/07/2023   IR IMAGING GUIDED PORT INSERTION  05/07/2023   IR NASO G TUBE PLC W/FL W/RAD  05/07/2023   Patient Active Problem List   Diagnosis Date Noted   Gout involving toe of left foot 06/24/2023   Hypomagnesemia 06/24/2023   Leukopenia due to antineoplastic chemotherapy (HCC) 06/10/2023   Chemotherapy-induced thrombocytopenia 06/10/2023   Cellulitis of right foot 05/20/2023   Port-A-Cath in place 05/13/2023   Encounter for antineoplastic chemotherapy 04/22/2023   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 Hx: He presented to his dentist last year with c/o a small lump to his right neck. He was referred to Dr. Donalee Fruits.04/02/23 Dr. Donalee Fruits noted a palpable 4 cm right level 2 neck mass. No other masses were palpated. He  collected a biopsy that day that showed findings consistent with metastatic SCC, p 16 status not obtained due to not enough material. 04/15/23 PET revealing hypermetabolic activity at the right lingual tonsil and right level II node. The enlarged lymph appeared to measure 4-5 cm in greatest dimension. No evidence of metastatic disease was seen. 04/20/23 Consult with Dr. Lurena Sally and 04/22/23 consult with Dr. Randye Buttner. He will receive chemotherapy with radiation.  Treatment plan:  He will receive 35 fractions of radiation to his Right tonsil and bilateral neck with weekly chemotherapy which started on 2/3 and will complete 3/21. Pretreatment procedures: 05/07/23 PEG/PAC  PAIN:  Are you having pain?  Yes: NPRS scale: 5/10 Pain location: lt foot Pain description: sore Aggravating factors: elevation Relieving factors: banging it into something  FALLS: Has patient fallen in last 6 months?  No   PATIENT GOALS: Maintain WNL swallowing  OBJECTIVE:  Note: Objective measures were completed at Evaluation unless otherwise noted.  TREATMENT DATE:   07/30/23: Pt gained 3 pounds this week. Reports using PEG for cherry juice only. Pt req'd min cues for procedure with Kathlen Para (5 second hold instead of 3 second hold). SLP stressed at LEAST 20 reps/day, up to 40 reps/day, as pt was not completing 20 reps/day for each. Pt req'd cues to complete exercises apart from POs.  Carley had cereal bar and water without overt s/sx pharyngeal deficits. Pt with s/sx xerostomia and SLP suggested to drink more water if needed withPOs.  SLP provided overt s/sx aspiration PNA. SLP and pt agree that next month if things remain the same next visit should be every two months.   07/02/23: Pt is having water by mouth now- one week from last day of rad tx. Is going to try chicken noodle soup today - SLP suggested blended  up. Education today about food journal, eating food that is tolerated. Pt performed HEP with independence. He told SLP rationale for HEP with independence.  05/27/23 (eval): Research states the risk for dysphagia increases due to radiation and/or chemotherapy treatment due to a variety of factors, so SLP educated the pt about the possibility of reduced/limited ability for PO intake during rad tx. SLP also educated pt regarding possible changes to swallowing musculature after rad tx, and why adherence to dysphagia HEP provided today and PO consumption was necessary to inhibit muscle fibrosis following rad tx and to mitigate muscle disuse atrophy. SLP informed pt why this would be detrimental to their swallowing status and to their pulmonary health. Pt demonstrated understanding of these things to SLP. SLP encouraged pt to safely eat and drink as deep into their radiation/chemotherapy as possible to provide the best possible long-term swallowing outcome for pt.  SLP then developed an individualized HEP for pt involving oral and pharyngeal strengthening and ROM and pt was instructed how to perform these exercises, including SLP demonstration. After SLP demonstration, pt return demonstrated each exercise. SLP ensured pt performance was correct prior to educating pt on next exercise. Pt required usual demo cues faded to modified independent to perform HEP. Pt was instructed to complete this program 6-7 days/week, at least 20 reps a day until 6 months after his or her last day of rad tx, and then x2 a week after that, indefinitely. Among other modifications for days when pt cannot functionally swallow, SLP also suggested pt to perform only non-swallowing tasks on the handout/HEP, and if necessary to cycle through the swallowing portion so the full program of exercises can be completed instead of fatiguing on one of the swallowing exercises and being unable to perform the other swallowing exercises. SLP instructed that  swallowing exercises should then be added back into the regimen as pt is able to do so.    PATIENT EDUCATION: Education details:  see "treatment today" Person educated: Patient and Spouse Education method: Explanation Education comprehension: verbalized understanding   ASSESSMENT:  CLINICAL IMPRESSION: Patient is a 63 y.o. M who was seen today for treatment of swallowing after undergoing chemoradiation therapy. Today pt ate cereal bar and drank thin liquids without overt s/s oral or pharyngeal difficulty. At this time pt swallowing is deemed WNL/WFL with these POs. No oral or overt s/sx pharyngeal deficits, including aspiration were observed. There are no overt s/s aspiration PNA observed by SLP nor any reported by pt at this time. Data indicate that pt's swallow ability will likely decrease over the course of radiation/chemoradiation therapy and could very well decline over time following the conclusion  of that therapy due to muscle disuse atrophy and/or muscle fibrosis. Pt will cont to need to be seen by SLP in order to assess safety of PO intake, assess the need for recommending any objective swallow assessment, and ensuring pt is correctly completing the individualized HEP.  OBJECTIVE IMPAIRMENTS: include dysphagia. These impairments are limiting patient from safety when swallowing. Factors affecting potential to achieve goals and functional outcome are  none . Patient will benefit from skilled SLP services to address above impairments and improve overall function.  REHAB POTENTIAL: Excellent   GOALS: Goals reviewed with patient? Generally, yes   SHORT TERM GOALS: Target: 3rd total session   1. Pt will compelte HEP with modified independence in 2 sessions Baseline: 07/02/23 Goal status: partially met   2.  pt will tell SLP why pt is completing HEP with modified independence Baseline:  Goal status: met   3.  pt will describe 3 overt s/s aspiration PNA with modified  independence Baseline:  Goal status: met   4.  pt will tell SLP how a food journal could ease return to a more normalized diet Baseline:  Goal status: met     LONG TERM GOALS: Target: 7th total session   1.  pt will complete HEP with independence over two visits Baseline:  Goal status: INITIAL   2.  pt will describe how to modify HEP over time, and the timeline associated with reduction in HEP frequency with modified independence over two sessions Baseline:  Goal status: INITIAL   PLAN:   SLP FREQUENCY:  once approx every 4 weeks   SLP DURATION:  7 sessions   PLANNED INTERVENTIONS: Aspiration precaution training, Pharyngeal strengthening exercises, Diet toleration management , Trials of upgraded texture/liquids, SLP instruction and feedback, Compensatory strategies, and Patient/family education  [facility], CCC-SLP 07/30/2023, 11:03 AM

## 2023-07-30 NOTE — Patient Instructions (Signed)
   Signs of Aspiration Pneumonia   Chest pain/tightness Fever (can be low grade) Cough  With foul-smelling phlegm (sputum) With sputum containing pus or blood With greenish sputum Fatigue  Shortness of breath  Wheezing   **IF YOU HAVE THESE SIGNS, CONTACT YOUR DOCTOR OR GO TO THE EMERGENCY DEPARTMENT OR URGENT CARE AS SOON AS POSSIBLE**

## 2023-08-03 ENCOUNTER — Inpatient Hospital Stay: Admitting: Dietician

## 2023-08-03 ENCOUNTER — Telehealth: Payer: Self-pay | Admitting: Dietician

## 2023-08-03 NOTE — Telephone Encounter (Signed)
 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 He is receiving concurrent chemoradiation (Final RT/chemo 3/21). Patient is under the care of Dr. Lurena Sally and Dr. Randye Buttner.    S/p PEG 1/31  Spoke with patient via telephone. He is doing well overall. Patient complaining of right sided mouth pain s/p teeth cleaning on 4/17. This is not preventing him from oral intake. He has used lidocaine  spray a few times when eating more acidic foods.  Patient reports tolerating a variety of foods. Had pizza for the first time last night (chicken, BBQ sauce). Taste has returned on the tip of tongue. Patient supplementing with true mass protein powder mixed with 1% milk (16 oz serving 690 kcal, 46g p). Patient has not used tube in the last 2 weeks other than 8 ounces of cherry juice (for gout) on 4/23 and daily FWF. He is monitoring weights daily at home. He reports weights have leveled off and stable. He denies constipation, diarrhea, nausea, vomiting.   Medications: reviewed  Labs: 4/10 - labs reviewed   Anthropometrics: Per pt 172-174 lb on home scale the last 2 weeks   Estimated Energy Needs  Kcals: 2700-3000 Protein: 122-140 Fluid: >2.7 L  NUTRITION DIAGNOSIS: Unintended wt loss - appears to be stable   INTERVENTION:  Continue high calorie high protein foods for wt maintenance Pt would like to have feeding tube removed - per report, pt meeting adequate nutrition via oral intake and recommend consideration of d/c of tube at this time Continue daily FWF     MONITORING, EVALUATION, GOAL: wt trends, intake   NEXT VISIT: To be scheduled as needed

## 2023-08-12 ENCOUNTER — Inpatient Hospital Stay: Attending: Oncology

## 2023-08-12 ENCOUNTER — Other Ambulatory Visit: Payer: Self-pay

## 2023-08-12 ENCOUNTER — Ambulatory Visit (INDEPENDENT_AMBULATORY_CARE_PROVIDER_SITE_OTHER): Admitting: Infectious Diseases

## 2023-08-12 ENCOUNTER — Inpatient Hospital Stay (HOSPITAL_BASED_OUTPATIENT_CLINIC_OR_DEPARTMENT_OTHER): Admitting: Oncology

## 2023-08-12 ENCOUNTER — Encounter: Payer: Self-pay | Admitting: Oncology

## 2023-08-12 VITALS — Wt 183.6 lb

## 2023-08-12 VITALS — BP 129/72 | HR 65 | Temp 97.5°F | Resp 17 | Wt 182.9 lb

## 2023-08-12 DIAGNOSIS — C024 Malignant neoplasm of lingual tonsil: Secondary | ICD-10-CM

## 2023-08-12 DIAGNOSIS — L03115 Cellulitis of right lower limb: Secondary | ICD-10-CM

## 2023-08-12 DIAGNOSIS — R635 Abnormal weight gain: Secondary | ICD-10-CM | POA: Insufficient documentation

## 2023-08-12 DIAGNOSIS — Z95828 Presence of other vascular implants and grafts: Secondary | ICD-10-CM

## 2023-08-12 DIAGNOSIS — L03116 Cellulitis of left lower limb: Secondary | ICD-10-CM | POA: Insufficient documentation

## 2023-08-12 DIAGNOSIS — Z7952 Long term (current) use of systemic steroids: Secondary | ICD-10-CM | POA: Insufficient documentation

## 2023-08-12 DIAGNOSIS — Z7189 Other specified counseling: Secondary | ICD-10-CM

## 2023-08-12 DIAGNOSIS — M10179 Lead-induced gout, unspecified ankle and foot: Secondary | ICD-10-CM

## 2023-08-12 DIAGNOSIS — Z79899 Other long term (current) drug therapy: Secondary | ICD-10-CM | POA: Insufficient documentation

## 2023-08-12 DIAGNOSIS — Z923 Personal history of irradiation: Secondary | ICD-10-CM | POA: Diagnosis not present

## 2023-08-12 DIAGNOSIS — M79671 Pain in right foot: Secondary | ICD-10-CM

## 2023-08-12 DIAGNOSIS — M7989 Other specified soft tissue disorders: Secondary | ICD-10-CM

## 2023-08-12 DIAGNOSIS — M79672 Pain in left foot: Secondary | ICD-10-CM

## 2023-08-12 LAB — CBC WITH DIFFERENTIAL (CANCER CENTER ONLY)
Abs Immature Granulocytes: 0.1 10*3/uL — ABNORMAL HIGH (ref 0.00–0.07)
Basophils Absolute: 0 10*3/uL (ref 0.0–0.1)
Basophils Relative: 1 %
Eosinophils Absolute: 0 10*3/uL (ref 0.0–0.5)
Eosinophils Relative: 0 %
HCT: 28.9 % — ABNORMAL LOW (ref 39.0–52.0)
Hemoglobin: 10 g/dL — ABNORMAL LOW (ref 13.0–17.0)
Immature Granulocytes: 2 %
Lymphocytes Relative: 4 %
Lymphs Abs: 0.2 10*3/uL — ABNORMAL LOW (ref 0.7–4.0)
MCH: 33.1 pg (ref 26.0–34.0)
MCHC: 34.6 g/dL (ref 30.0–36.0)
MCV: 95.7 fL (ref 80.0–100.0)
Monocytes Absolute: 0.3 10*3/uL (ref 0.1–1.0)
Monocytes Relative: 6 %
Neutro Abs: 4.4 10*3/uL (ref 1.7–7.7)
Neutrophils Relative %: 87 %
Platelet Count: 267 10*3/uL (ref 150–400)
RBC: 3.02 MIL/uL — ABNORMAL LOW (ref 4.22–5.81)
RDW: 15.7 % — ABNORMAL HIGH (ref 11.5–15.5)
WBC Count: 5 10*3/uL (ref 4.0–10.5)
nRBC: 0 % (ref 0.0–0.2)

## 2023-08-12 LAB — BASIC METABOLIC PANEL - CANCER CENTER ONLY
Anion gap: 5 (ref 5–15)
BUN: 29 mg/dL — ABNORMAL HIGH (ref 8–23)
CO2: 26 mmol/L (ref 22–32)
Calcium: 9 mg/dL (ref 8.9–10.3)
Chloride: 105 mmol/L (ref 98–111)
Creatinine: 1.16 mg/dL (ref 0.61–1.24)
GFR, Estimated: >60 mL/min (ref >60)
Glucose, Bld: 112 mg/dL — ABNORMAL HIGH (ref 70–99)
Potassium: 4.3 mmol/L (ref 3.5–5.1)
Sodium: 136 mmol/L (ref 135–145)

## 2023-08-12 LAB — MAGNESIUM: Magnesium: 2.1 mg/dL (ref 1.7–2.4)

## 2023-08-12 MED ORDER — HEPARIN SOD (PORK) LOCK FLUSH 100 UNIT/ML IV SOLN
500.0000 [IU] | Freq: Once | INTRAVENOUS | Status: AC
  Administered 2023-08-12: 500 [IU]

## 2023-08-12 MED ORDER — SODIUM CHLORIDE 0.9% FLUSH
10.0000 mL | Freq: Once | INTRAVENOUS | Status: AC
  Administered 2023-08-12: 10 mL

## 2023-08-12 NOTE — Progress Notes (Addendum)
 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   FINASTERIDE (PROSCAR) 5 MG TABLET    Take 5 mg by mouth daily. Takes 1/2 Tab to equal 2.5 mg daily   HYDROCODONE -ACETAMINOPHEN  (NORCO/VICODIN) 5-325 MG TABLET    Take 1 tablet by mouth every 6 (six) hours as needed.   LIDOCAINE  (XYLOCAINE ) 2 % SOLUTION    Patient: Mix 1part 2% viscous lidocaine , 1part H20. Swallow 10mL of diluted mixture, before meals and at bedtime, up to QID   LIDOCAINE -PRILOCAINE  (EMLA ) CREAM    Apply to affected area once   NUTRITIONAL SUPPLEMENTS (FEEDING SUPPLEMENT, KATE FARMS STANDARD ENT 1.4,) LIQD LIQUID    6 cartons Johny Nap 1.4/equivalent - give one and one half cartons (488 ml) via tube QID. Flush with 60 ml water before and after each bolus. Drink or give additional 830 ml water (3.5 cups) to meet hydration needs. Provides 1950 ml/day, 2730 kcal, 120 g protein, 1385 ml free water from formula (2695 ml total water with flushes). Meets 100% DRI   ONDANSETRON  (ZOFRAN ) 8 MG TABLET    Take 1 tablet (8 mg total) by mouth every 8 (eight) hours as needed for nausea or vomiting. Start on the third day after cisplatin .   PANTOPRAZOLE  (PROTONIX ) 40 MG TABLET    Take 1 tablet (40 mg total) by mouth daily.   PREDNISONE  (DELTASONE ) 20 MG TABLET    Take 2 tablets by mouth daily until further instructions   PROCHLORPERAZINE  (COMPAZINE ) 10 MG TABLET    Take 1 tablet (10 mg  total) by mouth every 6 (six) hours as needed (Nausea or vomiting).   SULFAMETHOXAZOLE -TRIMETHOPRIM  (BACTRIM  DS) 800-160 MG TABLET    Take 1 tablet by mouth 2 (two) times daily.   TADALAFIL (CIALIS) 20 MG TABLET    Take 20 mg by mouth daily as needed.   TRAMADOL  (ULTRAM ) 50 MG TABLET    Take 1 tablet (50 mg total) by mouth every 6 (six) hours as needed.   TRUE MAGNESIUM  OXIDE 400 MG TABLET    TAKE 1 TABLET BY MOUTH EVERY DAY  Modified Medications   No medications on file  Discontinued Medications   No medications on file    Subjective: Discussed the use of AI scribe software for clinical note transcription with the patient, who gave verbal consent to proceed.   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 Reports recurrent foot pain started after he was started on cancer treatment. The pain is severe, alternating between both feet, and intolerable to light touch. He was unable to bear weight on his foot. He was treated with 10 days of PO bactrim  for rt foot  cellulitis with resolution of symptoms in mid feb 2025.  He was seen in the ED 3/17 for swelling and rt foot pain. Xray with no acute abnormality. US  with no DVT. Discharged on doxycycline  for possible cellulitis with analgesics.  He then had left foot swelling and redness. He was prescribed PO prednisone  as well as PO bactrim  with resolution of symptoms.  He then again had  pain and swelling in the left foot end of April and started taking prednisone  tapered course. He was trying to get PO bactrim  but was unable to get it and symptoms resolved on prednisone  without use of antibiotics this time.   Reports prednisone  has been effective in reducing swelling and pain,  allowing ambulation. Heat application provides more relief than cold.  Reports being twice tested negative for gout, but symptoms include swelling and redness in the joints, particularly in the  great toe  Denies fever, chills, nausea, vomiting, diarrhea, abdominal pain. Denies cough, chest pain, or shortness of breath. Reports significant weight loss of 40 pounds, leading to increased sensitivity to cold after ca diagnosis. Denies other joint pain or rashes.   Abstains from alcohol since New Year's Eve and has not smoked for four months. Diet is predominantly vegetarian with occasional chicken and fish.  Review of Systems: all systems reviewed with pertinent positives and negatives as listed above   Past Medical History:  Diagnosis Date   Cancer (HCC)    neck and throat   Past Surgical History:  Procedure Laterality Date   CHOLECYSTECTOMY     HERNIA REPAIR     IR GASTROSTOMY TUBE MOD SED  05/07/2023   IR IMAGING GUIDED PORT INSERTION  05/07/2023   IR NASO G TUBE PLC W/FL W/RAD  05/07/2023    Social History   Tobacco Use   Smoking status: Never  Substance Use Topics   Alcohol use: No   Drug use: No    No family history on file.  No Known Allergies  Health Maintenance  Topic Date Due   COVID-19 Vaccine (1) Never done   HIV Screening  Never done   Hepatitis C Screening  Never done   DTaP/Tdap/Td (1 - Tdap) Never done   Pneumococcal Vaccine 8-85 Years old (1 of 2 - PCV) Never done   Zoster Vaccines- Shingrix (1 of 2) Never done   Colonoscopy  Never done   INFLUENZA VACCINE  11/05/2023   HPV VACCINES  Aged Out   Meningococcal B Vaccine  Aged Out    Objective: Wt 183 lb 9.6 oz (83.3 kg)   BMI 24.90 kg/m    Physical Exam Constitutional:      Appearance: Normal appearance.  HENT:     Head: Normocephalic and atraumatic.      Mouth: Mucous membranes are moist.  Eyes:    Conjunctiva/sclera: Conjunctivae normal.     Pupils: Pupils are equal, round, and b/l  symmetrical   Cardiovascular:     Rate and Rhythm: Normal rate and regular rhythm.     Heart sounds: s1s2  Pulmonary:     Effort: Pulmonary effort is normal.     Breath sounds: Normal breath sounds.   Abdominal:     General: Non distended     Palpations: soft. Feeding tube OK   Musculoskeletal:        General: Normal range of motion.   Skin:    General: Skin is warm and dry.     Comments: Portacath in place with no signs of infection.  Neurological:     General: grossly non focal     Mental Status: awake, alert and oriented to person, place, and time.   Psychiatric:        Mood and Affect: Mood normal.   Lab Results Lab Results  Component Value Date   WBC 5.0 08/12/2023   HGB 10.0 (L) 08/12/2023   HCT 28.9 (L) 08/12/2023   MCV 95.7 08/12/2023   PLT 267 08/12/2023    Lab Results  Component Value Date   CREATININE 1.16 08/12/2023   BUN 29 (H) 08/12/2023   NA 136 08/12/2023   K 4.3 08/12/2023   CL 105 08/12/2023   CO2 26 08/12/2023    Lab Results  Component Value Date   ALT 16 07/15/2023   AST 16 07/15/2023   ALKPHOS 53 07/15/2023   BILITOT 0.5 07/15/2023    No results found for: "CHOL", "HDL", "LDLCALC", "LDLDIRECT", "TRIG", "CHOLHDL" No results found for: "LABRPR", "RPRTITER" No results found for: "HIV1RNAQUANT", "HIV1RNAVL", "CD4TABS"   Microbiology Results for orders placed or performed during the hospital encounter of 06/08/08  Culture, blood (routine x 2)     Status: None   Collection Time: 06/13/08  2:25 AM   Specimen: BLOOD  Result Value Ref Range Status   Specimen Description BLOOD LEFT ARM  Final   Special Requests BOTTLES DRAWN AEROBIC AND ANAEROBIC 5CC EACH  Final   Culture   Final    STAPHYLOCOCCUS SPECIES (COAGULASE NEGATIVE) Note: Gram Stain Report Called to,Read Back By and Verified With: TATA CARBONE 06/14/2008 AT 10:00AM BY VERAR THE SIGNIFICANCE OF ISOLATING THIS ORGANISM FROM A SINGLE SET OF BLOOD CULTURES WHEN MULTIPLE SETS ARE  DRAWN IS UNCERTAIN. PLEASE NOTIFY THE  MICROBIOLOGY DEPARTMENT WITHIN ONE WEEK IF SPECIATION AND SENSITIVITIES ARE REQUIRED.   Report Status 06/15/2008 FINAL  Final  Culture, blood (routine x 2)     Status: None   Collection Time: 06/13/08  2:34 AM   Specimen: BLOOD  Result Value Ref Range Status   Specimen Description BLOOD LEFT ARM  Final   Special Requests BOTTLES DRAWN AEROBIC AND ANAEROBIC 5CC EACH  Final   Culture NO GROWTH 5 DAYS  Final   Report Status 06/19/2008 FINAL  Final  Urine culture     Status: None   Collection Time: 06/15/08  8:51 AM   Specimen: Urine, Clean Catch  Result Value Ref Range Status   Specimen Description URINE, CLEAN CATCH  Final   Special Requests NONE  Final   Colony Count NO GROWTH  Final   Culture NO GROWTH  Final   Report Status 06/16/2008 FINAL  Final   Imaging 06/21/23 Rt foot xray FINDINGS: There is no evidence of fracture or dislocation. There is no evidence of arthropathy or other focal bone abnormality. Soft tissues are unremarkable.   IMPRESSION: Negative.  Assessment/Plan # Possible Gout  - unlikely to have cellulitis with alternating episodes of pain and swelling in bilateral feet. Seems to have got both steroid and antibiotics previously resulting in improvement of symptoms. This last episode in left foot improved without antibiotics/on steroids only  - Although normal uric acid levels, does not r/o gout - continue tapered course of prednisone  as planned  - no indication of antibiotics  - fu with PCP   # Lingual Tonsil carcinoma  - on surveillance  - Fu with Oncology   I have personally spent 66 minutes involved in face-to-face and non-face-to-face activities for this patient on the day of the visit. Professional  time spent includes the following activities: Preparing to see the patient (review of tests), Obtaining and/or reviewing separately obtained history.  Performing a medically appropriate examination and/or evaluation ,  Ordering medications, Documenting clinical information in the EMR, Independently interpreting results (not separately reported), Communicating results to the patient. Counseling and educating the patient and Care coordination (not separately reported).   Of note, portions of this note may have been created with voice recognition software. While this note has been edited for accuracy, occasional wrong-word or 'sound-a-like' substitutions may have occurred due to the inherent limitations of voice recognition software.   Melvina Stage, MD [facility] 08/12/2023, 3:02 PM

## 2023-08-12 NOTE — Progress Notes (Signed)
 [facility]  ONCOLOGY CLINIC PROGRESS NOTE   Patient Care Team: Bertrum Brodie, MD as PCP - General (Family Medicine) Colie Dawes, MD as Attending Physician (Radiation Oncology) Janita Mellow, MD as Consulting Physician (Otolaryngology) Malmfelt, Nancyann Aye, RN as Oncology Nurse Navigator Arlo Berber, MD as Consulting Physician (Oncology)  PATIENT NAME: Randy Kaufman   MR#: [id number] DOB: 1961-03-23  Date of visit: 08/12/2023   ASSESSMENT & PLAN:   Randy Kaufman is a 63 y.o. 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 Unknown p16 status.   Lingual tonsil carcinoma (HCC) - Please review oncology history for additional details and timeline of events.  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 Unknown p16 status.  Clinically cT2,cN1,cM0.  There is concern for extracapsular extension based on imaging.  Discussed his case in tumor conference on 04/21/2023. Given the size and nature of the lymph node with concern for extracapsular extension, he has a high likelihood of requiring adjuvant chemoradiation if removed surgically.  Hence consensus opinion is to proceed with concurrent chemoradiation.  It was decided to not pursue another biopsy in an attempt to determine p16 status, since the treatment plan would not change.  -Previously I discussed tumor board recommendations with the patient.  He was agreeable to proceeding with concurrent chemoradiation. We have discussed about role of cisplatin  being a radiosensitizer in the treatment of head and neck cancer.  We have discussed about the curative intent of chemoradiation for this patient. Patient was willing to proceed with weekly cisplatin .  He  started radiation from 05/10/2023.  Started chemotherapy with cisplatin  from 05/14/2023 and completed dose #7 on 06/25/2023.  Because of cytopenias, we started dose reducing cisplatin  to 30 mg/m starting from cycle 5 onwards.  He is recovering well from chemoradiation related side effects.  Going forward we will continue surveillance as per NCCN guidelines.  PET scan has been requested to be done around 09/27/2023. - Advise him to contact if no communication by June 10th regarding PET scan   Cellulitis of right foot Ultrasound of the right lower extremity on 05/20/2023 showed no evidence of DVT.   He did have signs of cellulitis in the right foot.  Started him on empiric antibiotic course with Bactrim  DS tablet twice daily for 10 days.  Cellulitis resolved with this intervention.  Started having pain and redness of the right foot again from 06/21/2023.  He was directed to the ED and ultrasound showed no signs of DVT.  X-rays were negative for any fractures.  He was started on doxycycline  course.  He completed course.   Recurrent cellulitis of the left foot with persistent erythema and edema.  Recently completed a course of Bactrim  last month.  He is also on prednisone , tapered to 20 mg once daily.  Infectious etiology suspected; referral to infectious disease specialists planned for further evaluation. Denies systemic symptoms such as fever, chills, or night sweats. Discussed potential use of low-dose antibiotics as maintenance therapy to prevent recurrence, pending infectious disease evaluation. No evidence of circulatory issues; pulses are normal. Pain managed with hydrocodone  at night as needed.  - Previously referred to infectious disease specialists for further evaluation and management.  He has an appointment later today.  - Taper prednisone  to half a tablet daily for one  week, then quarter tablet for another week, and then discontinue.   Feeding tube in place He has not used the feeding  tube for over 15 days and is maintaining adequate oral intake. He has gained weight and is active, indicating good nutritional status. - Arrange for removal of the feeding tube  He previously had renal dysfunction which has resolved now with adequate hydration.  He was encouraged to maintain adequate hydration.   I reviewed lab results and outside records for this visit and discussed relevant results with the patient. Diagnosis, plan of care and treatment options were also discussed in detail with the patient. Opportunity provided to ask questions and answers provided to his apparent satisfaction. Provided instructions to call our clinic with any problems, questions or concerns prior to return visit. I recommended to continue follow-up with PCP and sub-specialists. He verbalized understanding and agreed with the plan.   NCCN guidelines have been consulted in the planning of this patient's care.  I spent a total of 30 minutes during this encounter with the patient including review of chart and various tests results, discussions about plan of care and coordination of care plan.   Arlo Berber, MD  08/12/2023 1:00 PM  [facility] advancing approximately 2 months for follow-up with repeat labs. [facility] - A DEPT OF [facility] [address] [place] [address] Dept: [phone] Dept Fax: [phone]    CHIEF COMPLAINT/ REASON FOR VISIT:   Squamous cell carcinoma consistent with a right lingual tonsillar primary with a metastatic ipsilateral cervical node. Clinically cT2,cN1,cM0.   Current Treatment: Concurrent chemoradiation with weekly cisplatin  started during the week of 05/10/2023.  INTERVAL HISTORY:    Discussed the use of AI scribe software for clinical note transcription with the patient, who gave verbal consent to proceed.  History of Present Illness Randy Kaufman is a 63 year old male who presents with redness and  swelling in his foot.  He experienced redness and swelling in his foot last week, which was severe enough to prevent him from walking, necessitating the use of a walker. He attempted to refill an antibiotic previously prescribed but did not receive a response. He noticed he could refill prednisone , which he has been taking as per a tapering schedule: two tablets daily for seven days, now reduced to one and a half tablets. This treatment has helped, and he is now able to walk again. No fevers, chills, or night sweats.  He reports eating well and has not used his feeding tube for over fifteen days, maintaining it only for hygiene purposes. 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 He is consuming a lot of water, noting his urine is a slight yellow, indicating good hydration and kidney function.  He is currently taking magnesium  as a supplement, as his insurance does not cover the prescribed form. He has resumed taking a Centrum supplement and is eating a varied diet, avoiding only spicy foods for now. He remains active, having mowed the grass with a push mower recently.   I have reviewed the past medical history, past surgical history, social history and family history with the patient and they are unchanged from previous note.  HISTORY OF PRESENT ILLNESS:   ONCOLOGY HISTORY:   He presented to his dentist last year with c/o of a small lump to his right neck. He was subsequently referred to Dr. Donalee Fruits at [facility],  on 04/02/23 for further evaluation. Physical exam performed at that time noted a palpable 4 cm right level 2 neck mass. No other masses were palpated.    A biopsy of the right neck mass was collected that date (04/02/23) and showed findings consistent with metastatic squamous cell carcinoma.  There was insufficient tissue to check for p16 status.   Staging PET scan on 04/14/22 revealed hypermetabolic activity at the right lingual tonsil and right level II node.  The enlarged lymph node appeared to measure approximately 2.6 cm in greatest dimension. No evidence of distant metastatic disease was seen.    He later had CT soft tissue neck at [facility].   Unknown p16 status.  Clinically cT2,cN1,cM0.  There is concern for extracapsular extension based on imaging.   Discussed his case in tumor conference on 04/21/2023. Given the size and nature of the lymph node with concern for extracapsular extension, he has a high likelihood of requiring adjuvant chemoradiation if removed surgically.  Hence consensus opinion was to proceed with concurrent chemoradiation. It was decided to not pursue another biopsy in an attempt to determine p16 status, since the treatment plan would not change.  He started radiation treatments from 05/10/2023.  Started chemotherapy with cisplatin  from 05/14/2023 and completed dose #7 on 06/25/2023.  Because of cytopenias, we started dose reducing cisplatin  to 30 mg/m starting from cycle 5 onwards. Completed radiation around the same time.  Oncology History  Lingual tonsil carcinoma (HCC)  04/20/2023 Initial Diagnosis   Lingual tonsil carcinoma (HCC)   04/22/2023 Cancer Staging   Staging form: Pharynx - HPV-Mediated Oropharynx, AJCC 8th Edition - Clinical: Stage I (cT2, cN1, cM0, p16: Unknown, HPV: Unknown) - Signed by Arlo Berber, MD on 04/22/2023   05/14/2023 -  Chemotherapy   Patient is on Treatment Plan : HEAD/NECK Cisplatin  (40) q7d         REVIEW OF SYSTEMS:   Review of Systems - Oncology  All other pertinent systems were reviewed with the patient and are negative.  ALLERGIES: He has no known allergies.  MEDICATIONS:  Current Outpatient Medications  Medication Sig Dispense Refill   Biotin 1000 MCG tablet Take 1,000 mcg by mouth 3 (three) times daily as needed (Dry mouth). (Patient not taking: Reported on 07/15/2023)     finasteride (PROSCAR) 5 MG tablet Take 5 mg by mouth daily. Takes 1/2 Tab to equal 2.5 mg daily      HYDROcodone -acetaminophen  (NORCO/VICODIN) 5-325 MG tablet Take 1 tablet by mouth every 6 (six) hours as needed. 60 tablet 0   lidocaine  (XYLOCAINE ) 2 % solution Patient: Mix 1part 2% viscous lidocaine , 1part H20. Swallow 10mL of diluted mixture, before meals and at bedtime, up to QID (Patient not taking: Reported on 07/15/2023) 200 mL 3   lidocaine -prilocaine  (EMLA ) cream Apply to affected area once 30 g 3   Nutritional Supplements (FEEDING SUPPLEMENT, [employer] E[employer] 1.4,) LIQD liquid 6 cartons Randy Kaufman 1.4/equivalent - give one and one half cartons (488 ml) via tube QID. Flush with 60 ml water before and after each bolus. Drink or give additional 830 ml water (3.5 cups) to meet hydration needs. Provides 1950 ml/day, 2730 kcal, 120 g protein, 1385 ml free water from formula (2695 ml total water with flushes). Meets 100% DRI     ondansetron  (ZOFRAN ) 8 MG tablet Take 1 tablet (8 mg total) by mouth every 8 (eight) hours as needed for nausea or vomiting. Start on the third day after cisplatin . (Patient not  taking: Reported on 07/15/2023) 30 tablet 1   pantoprazole  (PROTONIX ) 40 MG tablet Take 1 tablet (40 mg total) by mouth daily. (Patient not taking: Reported on 07/15/2023) 30 tablet 3   predniSONE  (DELTASONE ) 20 MG tablet Take 2 tablets by mouth daily until further instructions 40 tablet 1   prochlorperazine  (COMPAZINE ) 10 MG tablet Take 1 tablet (10 mg total) by mouth every 6 (six) hours as needed (Nausea or vomiting). 30 tablet 1   sulfamethoxazole -trimethoprim  (BACTRIM  DS) 800-160 MG tablet Take 1 tablet by mouth 2 (two) times daily. 20 tablet 0   tadalafil (CIALIS) 20 MG tablet Take 20 mg by mouth daily as needed. (Patient not taking: Reported on 07/15/2023)     traMADol  (ULTRAM ) 50 MG tablet Take 1 tablet (50 mg total) by mouth every 6 (six) hours as needed. (Patient not taking: Reported on 07/15/2023) 60 tablet 0   TRUE MAGNESIUM  OXIDE 400 MG tablet TAKE 1 TABLET BY MOUTH EVERY DAY 90  tablet 1   No current facility-administered medications for this visit.     VITALS:   Blood pressure 129/72, pulse 65, temperature (!) 97.5 F (36.4 C), temperature source Temporal, resp. rate 17, weight 182 lb 14.4 oz (83 kg), SpO2 100%.  Wt Readings from Last 3 Encounters:  08/12/23 182 lb 14.4 oz (83 kg)  07/13/23 182 lb 6.4 oz (82.7 kg)  07/01/23 190 lb 11.2 oz (86.5 kg)    Body mass index is 24.81 kg/m.    Onc Performance Status - 08/12/23 1200       ECOG Perf Status   ECOG Perf Status Fully active, able to carry on all pre-disease performance without restriction      KPS SCALE   KPS % SCORE Normal, no compliants, no evidence of disease              PHYSICAL EXAM:   Physical Exam Constitutional:      General: He is not in acute distress.    Appearance: Normal appearance.  HENT:     Head: Normocephalic and atraumatic.     Mouth/Throat:     Comments: No obvious abnormalities in the visualized portion of oral cavity Eyes:     General: No scleral icterus.    Conjunctiva/sclera: Conjunctivae normal.  Cardiovascular:     Rate and Rhythm: Normal rate and regular rhythm.     Pulses: Normal pulses.     Heart sounds: Normal heart sounds.  Pulmonary:     Effort: Pulmonary effort is normal.     Breath sounds: Normal breath sounds.  Chest:     Comments: Right-sided Port-A-Cath in place, without any evidence of infection Abdominal:     General: There is no distension.     Comments: Feeding tube in place.  No signs of infection.  Musculoskeletal:     Comments: Erythema resolved in bilateral feet.  No tenderness like before.   No purple discoloration now.  Distal pulses intact bilaterally  Lymphadenopathy:     Cervical: Cervical adenopathy (Significantly improved and softer compared to prior) present.  Neurological:     General: No focal deficit present.     Mental Status: He is alert and oriented to person, place, and time.  Psychiatric:        Mood and  Affect: Mood normal.        Behavior: Behavior normal.        Thought Content: Thought content normal.      LABORATORY DATA:   I have reviewed the  data as listed.  Results for orders placed or performed in visit on 08/12/23  Magnesium   Result Value Ref Range   Magnesium  2.1 1.7 - 2.4 mg/dL  Basic Metabolic Panel - Cancer Center Only  Result Value Ref Range   Sodium 136 135 - 145 mmol/L   Potassium 4.3 3.5 - 5.1 mmol/L   Chloride 105 98 - 111 mmol/L   CO2 26 22 - 32 mmol/L   Glucose, Bld 112 (H) 70 - 99 mg/dL   BUN 29 (H) 8 - 23 mg/dL   Creatinine 1.61 0.96 - 1.24 mg/dL   Calcium 9.0 8.9 - 04.5 mg/dL   GFR, Estimated >40 >98 mL/min   Anion gap 5 5 - 15  CBC with Differential (Cancer Center Only)  Result Value Ref Range   WBC Count 5.0 4.0 - 10.5 K/uL   RBC 3.02 (L) 4.22 - 5.81 MIL/uL   Hemoglobin 10.0 (L) 13.0 - 17.0 g/dL   HCT 11.9 (L) 14.7 - 82.9 %   MCV 95.7 80.0 - 100.0 fL   MCH 33.1 26.0 - 34.0 pg   MCHC 34.6 30.0 - 36.0 g/dL   RDW 56.2 (H) 13.0 - 86.5 %   Platelet Count 267 150 - 400 K/uL   nRBC 0.0 0.0 - 0.2 %   Neutrophils Relative % 87 %   Neutro Abs 4.4 1.7 - 7.7 K/uL   Lymphocytes Relative 4 %   Lymphs Abs 0.2 (L) 0.7 - 4.0 K/uL   Monocytes Relative 6 %   Monocytes Absolute 0.3 0.1 - 1.0 K/uL   Eosinophils Relative 0 %   Eosinophils Absolute 0.0 0.0 - 0.5 K/uL   Basophils Relative 1 %   Basophils Absolute 0.0 0.0 - 0.1 K/uL   Immature Granulocytes 2 %   Abs Immature Granulocytes 0.10 (H) 0.00 - 0.07 K/uL      RADIOGRAPHIC STUDIES:  No recent pertinent imaging available to review.  CODE STATUS:  Code Status History     Date Active Date Inactive Code Status Order ID Comments User Context   05/07/2023 1642 05/08/2023 0512 Full Code [id number]  Art Largo, MD [facility]   05/07/2023 1642 05/07/2023 1642 Full Code [id number]  Art Largo, MD [facility]    Questions for Most Recent Historical Code Status (Order [id number])     Question Answer   By: Consent:  discussion documented in EHR            No orders of the defined types were placed in this encounter.    Future Appointments  Date Time Provider Department Center  08/12/2023  3:00 PM Terre Ferri, MD [facility]  09/01/2023  8:45 AM Lilia Reges, CCC-SLP [facility]  10/01/2023  2:00 PM Colie Dawes, MD [facility] None  10/13/2023 11:15 AM [facility] None  10/13/2023 11:45 AM Siboney Requejo, Gale Jude, MD [facility] None      This document was completed utilizing speech recognition software. Grammatical errors, random word insertions, pronoun errors, and incomplete sentences are an occasional consequence of this system due to software limitations, ambient noise, and hardware issues. Any formal questions or concerns about the content, text or information contained within the body of this dictation should be directly addressed to the provider for clarification.

## 2023-08-12 NOTE — Assessment & Plan Note (Addendum)
 Ultrasound of the right lower extremity on 05/20/2023 showed no evidence of DVT.   He did have signs of cellulitis in the right foot.  Started him on empiric antibiotic course with Bactrim  DS tablet twice daily for 10 days.  Cellulitis resolved with this intervention.  Started having pain and redness of the right foot again from 06/21/2023.  He was directed to the ED and ultrasound showed no signs of DVT.  X-rays were negative for any fractures.  He was started on doxycycline  course.  He completed course.   Recurrent cellulitis of the left foot with persistent erythema and edema.  Recently completed a course of Bactrim  last month.  He is also on prednisone , tapered to 20 mg once daily.  Infectious etiology suspected; referral to infectious disease specialists planned for further evaluation. Denies systemic symptoms such as fever, chills, or night sweats. Discussed potential use of low-dose antibiotics as maintenance therapy to prevent recurrence, pending infectious disease evaluation. No evidence of circulatory issues; pulses are normal. Pain managed with hydrocodone  at night as needed.  - Previously referred to infectious disease specialists for further evaluation and management.  He has an appointment later today.  - Taper prednisone  to half a tablet daily for one week, then quarter tablet for another week, and then discontinue.

## 2023-08-12 NOTE — Assessment & Plan Note (Addendum)
-   Please review oncology history for additional details and timeline of events.  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 Unknown p16 status.  Clinically cT2,cN1,cM0.  There is concern for extracapsular extension based on imaging.  Discussed his case in tumor conference on 04/21/2023. Given the size and nature of the lymph node with concern for extracapsular extension, he has a high likelihood of requiring adjuvant chemoradiation if removed surgically.  Hence consensus opinion is to proceed with concurrent chemoradiation.  It was decided to not pursue another biopsy in an attempt to determine p16 status, since the treatment plan would not change.  -Previously I discussed tumor board recommendations with the patient.  He was agreeable to proceeding with concurrent chemoradiation. We have discussed about role of cisplatin  being a radiosensitizer in the treatment of head and neck cancer.  We have discussed about the curative intent of chemoradiation for this patient. Patient was willing to proceed with weekly cisplatin .  He started radiation from 05/10/2023.  Started chemotherapy with cisplatin  from 05/14/2023 and completed dose #7 on 06/25/2023.  Because of cytopenias, we started dose reducing cisplatin  to 30 mg/m starting from cycle 5 onwards.  He is recovering well from chemoradiation related side effects.  Going forward we will continue surveillance as per NCCN guidelines.  PET scan has been requested to be done around 09/27/2023. - Advise him to contact if no communication by June 10th regarding PET scan

## 2023-08-13 ENCOUNTER — Other Ambulatory Visit: Payer: Self-pay

## 2023-08-18 DIAGNOSIS — M10179 Lead-induced gout, unspecified ankle and foot: Secondary | ICD-10-CM | POA: Insufficient documentation

## 2023-08-19 ENCOUNTER — Other Ambulatory Visit: Payer: Self-pay

## 2023-08-20 ENCOUNTER — Encounter: Payer: Self-pay | Admitting: Oncology

## 2023-08-20 ENCOUNTER — Ambulatory Visit (HOSPITAL_COMMUNITY)
Admission: RE | Admit: 2023-08-20 | Discharge: 2023-08-20 | Disposition: A | Discharge status: Home / Self Care | Source: Ambulatory Visit | Attending: Oncology | Admitting: Oncology

## 2023-08-20 DIAGNOSIS — C024 Malignant neoplasm of lingual tonsil: Secondary | ICD-10-CM | POA: Insufficient documentation

## 2023-08-20 DIAGNOSIS — Z431 Encounter for attention to gastrostomy: Secondary | ICD-10-CM | POA: Diagnosis present

## 2023-09-01 ENCOUNTER — Ambulatory Visit

## 2023-09-20 ENCOUNTER — Ambulatory Visit: Attending: Radiation Oncology

## 2023-09-20 DIAGNOSIS — R1310 Dysphagia, unspecified: Secondary | ICD-10-CM | POA: Diagnosis present

## 2023-09-20 NOTE — Therapy (Signed)
 OUTPATIENT SPEECH LANGUAGE PATHOLOGY ONCOLOGY TREATMENT   Patient Name: Randy Kaufman MRN: [id number] DOB:November 30, 1960, 63 y.o., male Today's Date: 09/20/2023  PCP: Randy Kaufman REFERRING PROVIDER: Colie Kaufman  END OF SESSION:  End of Session - 09/20/23 1544     Visit Number 4    Number of Visits 7    Date for SLP Re-Evaluation 09/20/23    SLP Start Time 1535    Activity Tolerance Patient tolerated treatment well            Past Medical History:  Diagnosis Date   Cancer (HCC)    neck and throat   Past Surgical History:  Procedure Laterality Date   CHOLECYSTECTOMY     HERNIA REPAIR     IR GASTROSTOMY TUBE MOD SED  05/07/2023   IR GASTROSTOMY TUBE REMOVAL  08/20/2023   IR IMAGING GUIDED PORT INSERTION  05/07/2023   IR NASO G TUBE PLC W/FL W/RAD  05/07/2023   Patient Active Problem List   Diagnosis Date Noted   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   Cellulitis of left foot 08/12/2023   Medication management 08/12/2023   Gout involving toe of left foot 06/24/2023   Hypomagnesemia 06/24/2023   Leukopenia due to antineoplastic chemotherapy (HCC) 06/10/2023   Chemotherapy-induced thrombocytopenia 06/10/2023   Cellulitis of right foot 05/20/2023   Port-A-Cath in place 05/13/2023   Encounter for antineoplastic chemotherapy 04/22/2023   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 Hx: He presented to his dentist last year with c/o a small lump to his right neck. He was referred to Dr. Donalee Kaufman.04/02/23 Dr. Donalee Kaufman noted a  palpable 4 cm right level 2 neck mass. No other masses were palpated. He collected a biopsy that day that showed findings consistent with metastatic SCC, p 16 status not obtained due to not enough material. 04/15/23 PET revealing hypermetabolic activity at the right lingual tonsil and right level II node. The enlarged lymph appeared to measure 4-5 cm in greatest dimension. No evidence of metastatic disease was seen. 04/20/23 Consult with Dr. Lurena Kaufman and 04/22/23 consult with Dr. Randye Kaufman. He will receive chemotherapy with radiation.  Treatment plan:  He will receive 35 fractions of radiation to his Right tonsil and bilateral neck with weekly chemotherapy which started on 2/3 and will complete 3/21. Pretreatment procedures: 05/07/23 PEG/PAC  PAIN:  Are you having pain?  No  FALLS: Has patient fallen in last 6 months?  No   PATIENT GOALS: Maintain WNL swallowing  OBJECTIVE:  Note: Objective measures were completed at Evaluation unless otherwise noted.  TREATMENT DATE:   09/20/23: PEG has been removed. Pt entered today saying he could eat peanut butter crackers. He did so with min liquid wash but no overt s/sx oral or pharyngeal deficits. He is eating desired foods at this point, not always with WNL taste.  With HEP pt req'd min A with tongue protrusion with Masako. SLP told pt to use a mirror with Masako for better adherence with this. Pt reported HEP x4/week - SLP encouraged x5 days/week. After reminding pt when he could decr to x2/week, he indicated understanding and repeated to SLP later.   07/30/23: Pt gained 3 pounds this week. Reports using PEG for cherry juice only. Pt req'd min cues for procedure with Randy Kaufman (5 second hold instead of 3 second hold). SLP stressed at LEAST 20 reps/day, up to 40 reps/day, as pt was not completing 20 reps/day for each. Pt req'd cues to complete  exercises apart from POs.  Randy Kaufman had cereal bar and water without overt s/sx pharyngeal deficits. Pt with s/sx xerostomia and SLP suggested to drink more water if needed withPOs.  SLP provided overt s/sx aspiration PNA. SLP and pt agree that next month if things remain the same next visit should be every two months.   07/02/23: Pt is having water by mouth now- one week from last day of rad tx. Is going to try chicken noodle soup today - SLP suggested blended up. Education today about food journal, eating food that is tolerated. Pt performed HEP with independence. He told SLP rationale for HEP with independence.  05/27/23 (eval): Research states the risk for dysphagia increases due to radiation and/or chemotherapy treatment due to a variety of factors, so SLP educated the pt about the possibility of reduced/limited ability for PO intake during rad tx. SLP also educated pt regarding possible changes to swallowing musculature after rad tx, and why adherence to dysphagia HEP provided today and PO consumption was necessary to inhibit muscle fibrosis following rad tx and to mitigate muscle disuse atrophy. SLP informed pt why this would be detrimental to their swallowing status and to their pulmonary health. Pt demonstrated understanding of these things to SLP. SLP encouraged pt to safely eat and drink as deep into their radiation/chemotherapy as possible to provide the best possible long-term swallowing outcome for pt.  SLP then developed an individualized HEP for pt involving oral and pharyngeal strengthening and ROM and pt was instructed how to perform these exercises, including SLP demonstration. After SLP demonstration, pt return demonstrated each exercise. SLP ensured pt performance was correct prior to educating pt on next exercise. Pt required usual demo cues faded to modified independent to perform HEP. Pt was instructed to complete this program 6-7 days/week, at least 20 reps a day until 6 months after his or  her last day of rad tx, and then x2 a week after that, indefinitely. Among other modifications for days when pt cannot functionally swallow, SLP also suggested pt to perform only non-swallowing tasks on the handout/HEP, and if necessary to cycle through the swallowing portion so the full program of exercises can be completed instead of fatiguing on one of the swallowing exercises and being unable to perform the other swallowing exercises. SLP instructed that swallowing exercises should then be added back into the regimen as pt is able to do so.    PATIENT EDUCATION: Education details: see treatment today Person educated: Patient and Spouse Education method: Explanation Education comprehension: verbalized understanding   ASSESSMENT:  CLINICAL IMPRESSION: Patient is a  63y.o. M who was seen today for treatment of swallowing after undergoing chemoradiation therapy. Today pt ate crackers and drank thin liquids without overt s/s oral or pharyngeal difficulty. At this time pt swallowing is deemed WNL/WFL with these POs. No oral or overt s/sx pharyngeal deficits, including aspiration were observed. There are no overt s/s aspiration PNA observed by SLP nor any reported by pt at this time. Data indicate that pt's swallow ability could decrease over time following the conclusion of rad therapy due to muscle disuse atrophy and/or muscle fibrosis. Pt will cont to need to be seen by SLP in order to assess safety of PO intake, assess the need for recommending any objective swallow assessment, and ensuring pt is correctly completing the individualized HEP. If pt's status remains essentially unchanged next session d/c is likely.  OBJECTIVE IMPAIRMENTS: include dysphagia. These impairments are limiting patient from safety when swallowing. Factors affecting potential to achieve goals and functional outcome are none. Patient will benefit from skilled SLP services to address above impairments and improve overall  function.  REHAB POTENTIAL: Excellent   GOALS: Goals reviewed with patient? Generally, yes   SHORT TERM GOALS: Target: 3rd total session   1. Pt will compelte HEP with modified independence in 2 sessions Baseline: 07/02/23 Goal status: partially met   2.  pt will tell SLP why pt is completing HEP with modified independence Baseline:  Goal status: met   3.  pt will describe 3 overt s/s aspiration PNA with modified independence Baseline:  Goal status: met   4.  pt will tell SLP how a food journal could ease return to a more normalized diet Baseline:  Goal status: met     LONG TERM GOALS: Target: 7th total session   1.  pt will complete HEP with independence over two visits Baseline:  Goal status: INITIAL   2.  pt will describe how to modify HEP over time, and the timeline associated with reduction in HEP frequency with modified independence over two sessions Baseline: 09/20/23 Goal status: INITIAL   PLAN:   SLP FREQUENCY:  once approx every 4 weeks   SLP DURATION:  7 sessions   PLANNED INTERVENTIONS: Aspiration precaution training, Pharyngeal strengthening exercises, Diet toleration management , Trials of upgraded texture/liquids, SLP instruction and feedback, Compensatory strategies, and Patient/family education  [facility], CCC-SLP 09/20/2023, 3:48 PM

## 2023-09-21 ENCOUNTER — Other Ambulatory Visit: Payer: Self-pay

## 2023-09-27 ENCOUNTER — Encounter (HOSPITAL_COMMUNITY)
Admission: RE | Admit: 2023-09-27 | Discharge: 2023-09-27 | Disposition: A | Discharge status: Home / Self Care | Source: Ambulatory Visit | Attending: Radiology | Admitting: Radiology

## 2023-09-27 ENCOUNTER — Telehealth: Payer: Self-pay | Admitting: Radiation Oncology

## 2023-09-27 DIAGNOSIS — C024 Malignant neoplasm of lingual tonsil: Secondary | ICD-10-CM | POA: Insufficient documentation

## 2023-09-27 LAB — GLUCOSE, CAPILLARY: Glucose-Capillary: 91 mg/dL (ref 70–99)

## 2023-09-27 MED ORDER — FLUDEOXYGLUCOSE F - 18 (FDG) INJECTION
8.3200 | Freq: Once | INTRAVENOUS | Status: AC
  Administered 2023-09-27: 8.32 via INTRAVENOUS

## 2023-09-27 NOTE — Telephone Encounter (Signed)
 Pt called requesting earlier appt for PET review f/u appt. Appt moved to 6/27 @ 8:20am to suit pt's request.

## 2023-09-30 ENCOUNTER — Ambulatory Visit: Admitting: Physician Assistant

## 2023-09-30 ENCOUNTER — Other Ambulatory Visit (INDEPENDENT_AMBULATORY_CARE_PROVIDER_SITE_OTHER): Payer: Self-pay

## 2023-09-30 DIAGNOSIS — M25511 Pain in right shoulder: Secondary | ICD-10-CM

## 2023-09-30 MED ORDER — LIDOCAINE HCL 1 % IJ SOLN
5.0000 mL | INTRAMUSCULAR | Status: AC | PRN
  Administered 2023-09-30: 5 mL

## 2023-09-30 MED ORDER — METHYLPREDNISOLONE ACETATE 40 MG/ML IJ SUSP
40.0000 mg | INTRAMUSCULAR | Status: AC | PRN
  Administered 2023-09-30: 40 mg via INTRA_ARTICULAR

## 2023-09-30 NOTE — Progress Notes (Signed)
 Office Visit Note   Patient: Randy Kaufman           Date of Birth: 30-Sep-1960           MRN: [id number] Visit Date: 09/30/2023              Requested by: [name], MD [address] [place],  [place] [address] PCP: [name], MD   Assessment & Plan: Visit Diagnoses:  1. Right shoulder pain, unspecified chronicity     Plan: Richie is a pleasant right-hand-dominant gentleman with a several month history of right shoulder pain.  He is status post chemoradiation therapy for tonsillar cancer.  He has lost some weight but he is trying to get back in the gym.  He did have a PET scan recently.  No indication of issues in the shoulder.  X-rays today suggest some AC arthritis and exam consistent with rotator cuff tendinitis.  Had a long discussion with him we will go forward with an injection today as he is having difficulty sleeping on the shoulder he is also going to the gym so I did give him a welder directed exercise program for the rotator cuff should follow-up with me in a month  Follow-Up Instructions: Return in about 1 month (around 10/30/2023).   Orders:  Orders Placed This Encounter  Procedures   XR Shoulder Right   No orders of the defined types were placed in this encounter.     Procedures: Large Joint Inj: R subacromial bursa on 09/30/2023 9:48 AM Indications: diagnostic evaluation and pain Details: 25 G 1.5 in needle, posterior approach  Arthrogram: No  Medications: 5 mL lidocaine  1 %; 40 mg methylPREDNISolone acetate 40 MG/ML Outcome: tolerated well, no immediate complications Procedure, treatment alternatives, risks and benefits explained, specific risks discussed. Consent was given by the patient.       Clinical Data: No additional findings.   Subjective: Chief Complaint  Patient presents with   Right Shoulder - Pain    HPI pleasant 63 year old right-hand-dominant gentleman presents today for evaluation of right shoulder pain times several months.   Does not remember any particular injury hurt hurts him particularly reaching behind his back and with some overhead motion.  Has never had problems in the past.  Been recently treated for tonsillar cancer.  Has had a PET scan but did not show any issues.  Denies any neck pain or paresthesias  Review of Systems  All other systems reviewed and are negative.    Objective: Vital Signs: There were no vitals taken for this visit.  Physical Exam Constitutional:      Appearance: Normal appearance.  Pulmonary:     Effort: Pulmonary effort is normal.   Skin:    General: Skin is warm and dry.   Neurological:     General: No focal deficit present.     Mental Status: He is alert and oriented to person, place, and time.   Psychiatric:        Mood and Affect: Mood normal.        Behavior: Behavior normal.     Ortho Exam Examination of his right shoulder he has good forward elevation a little bit of pain about at 160 degrees pain with internal rotation behind the back he has great abductor internal/external rotation strength good biceps triceps strength grip strength is intact no radicular findings he does have a positive empty can test and positive Neer's impingement sign.  Mildly positive speeds sign Specialty Comments:  No specialty comments available.  Imaging: XR Shoulder Right Result Date: 09/30/2023 Radiograph of his right shoulder demonstrate degenerative changes of the [facility] joint.  Mild degenerative changes of the glenohumeral joint.  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  Past Surgical History:  Procedure Laterality Date   CHOLECYSTECTOMY     HERNIA REPAIR     IR GASTROSTOMY TUBE MOD SED  05/07/2023   IR GASTROSTOMY TUBE REMOVAL  08/20/2023   IR IMAGING GUIDED PORT INSERTION  05/07/2023   IR NASO G TUBE PLC W/FL W/RAD  05/07/2023   Social History   Occupational History   Not on file  Tobacco Use   Smoking status: Never   Smokeless tobacco: Not on file  Substance and Sexual Activity   Alcohol use: No   Drug use: No   Sexual activity: Not on file

## 2023-10-01 ENCOUNTER — Telehealth: Payer: Self-pay | Admitting: Radiation Oncology

## 2023-10-01 ENCOUNTER — Ambulatory Visit
Admission: RE | Admit: 2023-10-01 | Discharge: 2023-10-01 | Disposition: A | Discharge status: Home / Self Care | Payer: Self-pay | Source: Ambulatory Visit | Attending: Radiation Oncology | Admitting: Radiation Oncology

## 2023-10-01 ENCOUNTER — Other Ambulatory Visit: Payer: Self-pay

## 2023-10-01 VITALS — BP 135/90 | HR 64 | Temp 97.7°F | Resp 18 | Ht 72.0 in | Wt 175.0 lb

## 2023-10-01 DIAGNOSIS — C024 Malignant neoplasm of lingual tonsil: Secondary | ICD-10-CM

## 2023-10-01 NOTE — Progress Notes (Deleted)
 Randy Kaufman

## 2023-10-01 NOTE — Telephone Encounter (Signed)
 Called pt to sched f/u for 03/13/24 @ 8:20am per RN Malmfelt to review CT Scans with provider. Pt requested appt time.

## 2023-10-01 NOTE — Progress Notes (Signed)
 Radiation Oncology         [phone] ________________________________  Name: Randy Kaufman MRN: [id number]  Date: 10/01/2023  DOB: 13-Sep-1960  Follow-Up Visit Note  CC: Randy Round, MD  Randy Round, MD  Diagnosis and Prior Radiotherapy:       ICD-10-CM   1. 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   CHIEF COMPLAINT:  Here for follow-up and surveillance of oropharyngeal cancer  Narrative:  The patient returns today for routine follow-up.  He completed his treatment on 06/25/2023.    Randy Kaufman is a 63 year old male with a history of cancer who presents for follow-up to review PET scan results after treatment.  Overall he feels very good Wt Readings from Last 3 Encounters:  10/01/23 175 lb (79.4 kg)  08/12/23 183 lb 9.6 oz (83.3 kg)  08/12/23 182 lb 14.4 oz (83 kg)   His feeding tube has been removed.  He is doing his swallowing and physical therapy exercises at home but admits he could do his swallowing exercises more often.  He is recovering from a sinus infection and experiencing side effects from sulfa  antibiotics, including a sore throat primarily on the right side.   He describes his dental hygiene routine, which includes flossing, brushing with regular toothpaste, and using  extra fluoride toothpaste without rinsing. He has not seen a dentist since completing treatment but has picked up fluoride paste.  ALLERGIES:  is allergic to sulfa  antibiotics.  Meds: Current Outpatient Medications  Medication Sig Dispense Refill   Biotin 1000 MCG tablet Take 1,000 mcg by mouth 3 (three) times daily as needed (Dry mouth). (Patient not taking: Reported on 08/12/2023)     finasteride (PROSCAR) 5 MG tablet Take 5 mg by mouth daily. Takes 1/2 Tab to equal 2.5 mg daily     HYDROcodone -acetaminophen  (NORCO/VICODIN) 5-325 MG tablet Take 1 tablet by mouth every 6 (six) hours as needed. (Patient not taking: Reported on 08/12/2023) 60 tablet 0   lidocaine  (XYLOCAINE ) 2 % solution Patient: Mix 1part 2% viscous lidocaine , 1part H20. Swallow 10mL of diluted mixture, before meals and at bedtime, up to QID (Patient not taking: Reported on 08/12/2023) 200 mL 3   lidocaine -prilocaine  (EMLA ) cream Apply to affected area once (Patient not taking: Reported on 08/12/2023) 30 g 3   Magnesium  Gluconate 550 MG TABS Take 30 mg by mouth.     Nutritional Supplements (FEEDING SUPPLEMENT, [employer] STANDARD ENT 1.4,) LIQD liquid 6 cartons Randy Kaufman 1.4/equivalent - give one and one half cartons (488 ml) via tube QID. Flush with 60 ml water before and after each bolus. Drink or give additional 830 ml water (3.5 cups) to meet hydration needs. Provides 1950 ml/day, 2730 kcal, 120 g protein, 1385 ml free water from formula (2695 ml total water with flushes). Meets 100% DRI (Patient  not taking: Reported on 08/12/2023)     ondansetron  (ZOFRAN ) 8 MG tablet Take 1 tablet (8 mg total) by mouth every 8 (eight) hours as needed for nausea or vomiting. Start on the third day after cisplatin . (Patient not taking: Reported on 07/13/2023) 30 tablet 1   pantoprazole  (PROTONIX ) 40 MG tablet Take 1 tablet (40 mg total) by mouth daily. (Patient not taking: Reported on 07/13/2023) 30 tablet 3   predniSONE  (DELTASONE ) 20 MG tablet Take 2  tablets by mouth daily until further instructions 40 tablet 1   prochlorperazine  (COMPAZINE ) 10 MG tablet Take 1 tablet (10 mg total) by mouth every 6 (six) hours as needed (Nausea or vomiting). (Patient not taking: Reported on 08/12/2023) 30 tablet 1   sulfamethoxazole -trimethoprim  (BACTRIM  DS) 800-160 MG tablet Take 1 tablet by mouth 2 (two) times daily. (Patient not taking: Reported on 08/12/2023) 20 tablet 0   tadalafil (CIALIS) 20 MG tablet Take 20 mg by mouth daily as needed. (Patient not taking: Reported on 06/02/2023)     traMADol  (ULTRAM ) 50 MG tablet Take 1 tablet (50 mg total) by mouth every 6 (six) hours as needed. (Patient not taking: Reported on 06/02/2023) 60 tablet 0   TRUE MAGNESIUM  OXIDE 400 MG tablet TAKE 1 TABLET BY MOUTH EVERY DAY (Patient not taking: Reported on 08/12/2023) 90 tablet 1   No current facility-administered medications for this encounter.    Physical Findings: The patient is in no acute distress. Patient is alert and oriented. Wt Readings from Last 3 Encounters:  10/01/23 175 lb (79.4 kg)  08/12/23 183 lb 9.6 oz (83.3 kg)  08/12/23 182 lb 14.4 oz (83 kg)    height is 6' (1.829 m) and weight is 175 lb (79.4 kg). His temperature is 97.7 F (36.5 C). His blood pressure is 135/90 (abnormal) and his pulse is 64. His respiration is 18 and oxygen saturation is 100%. .  General: Alert and oriented, in no acute distress HEENT: Tongue midline. Two white patches on right palate, consistent with mucositis. Mouth slightly dry. NECK: Palpable lymph node in right neck, smaller than previous. No other masses in neck. Skin: Skin in treatment fields shows mild hyperpigmentation, consistent with satisfactory healing Heart: Regular in rate and rhythm with no murmurs, rubs, or gallops. Chest: Clear to auscultation bilaterally, with no rhonchi, wheezes, or rales. Abdomen: Soft, nontender, nondistended, with no rigidity or guarding. Extremities: no edema Lymphatics: see Neck Exam MSK  well-nourished and ambulatory Psychiatric: Judgment and insight are intact. Affect is appropriate.   Lab Findings: Lab Results  Component Value Date   WBC 5.0 08/12/2023   HGB 10.0 (L) 08/12/2023   HCT 28.9 (L) 08/12/2023   MCV 95.7 08/12/2023   PLT 267 08/12/2023    Lab Results  Component Value Date   TSH 0.822 04/22/2023    Radiographic Findings: XR Shoulder Right Result Date: 09/30/2023 Radiograph of his right shoulder demonstrate degenerative changes of the [facility] joint.  Mild degenerative changes of the glenohumeral joint.  No appreciated fractures  NM PET Image Restag (PS) Skull Base To Thigh Result Date: 09/28/2023 CLINICAL DATA:  Subsequent treatment strategy for tonsillar cancer. EXAM: NUCLEAR MEDICINE PET SKULL BASE TO THIGH TECHNIQUE: 8.3 mCi F-18 FDG was injected intravenously. Full-ring PET imaging was performed from the skull base to thigh after the radiotracer. CT data was obtained and used for attenuation correction and anatomic localization. Fasting blood glucose: 91 mg/dl COMPARISON:  98/90/7974 FINDINGS: Mediastinal blood pool activity: SUV max 2.3 Liver activity: SUV max  NA NECK: Right palatine tonsillar activity substantially decreased, formerly 10.6 and currently 3.2. There is previously a left palatine tonsillar activity as well with maximum SUV 5.8, currently 2.8. On today's exam there is mildly accentuated activity along the tongue base, maximum SUV 4.6. This could be physiologic but is technically nonspecific. Improved conglomerate right level IIa adenopathy, index node currently 1.4 cm in short axis with maximum SUV 3.4, previously 2.6 cm in short axis with maximum SUV 11.6. Small focus of activity in the vicinity of the right inferior constrictor, maximum SUV 4.6 and previously 5.4 in this region, probably incidental. Incidental CT findings: None. CHEST: No significant abnormal hypermetabolic activity in this region. Incidental CT findings: Right Port-A-Cath tip:  Cavoatrial junction. Left anterior descending coronary artery atheromatous vascular calcification on image 96 series 4, compatible with coronary artery disease. Mild aortic arch atheromatous vascular calcification. Ground-glass density nodularity in the right upper lobe anteriorly on image 18 series 7 measuring 1.1 by 0.6 cm with maximum SUV 3.2. This was not present 12/16/2023 and is most likely inflammatory based on morphology and previous absence, surveillance suggested. ABDOMEN/PELVIS: Physiologic activity in bowel. Incidental CT findings: Nonobstructive right nephrolithiasis. Cholecystectomy. Anterior pelvic hernia mesh. Scattered diverticula of the sigmoid colon. SKELETON: No significant abnormal hypermetabolic activity in this region. Incidental CT findings: Degenerative right sternoclavicular arthropathy. Deformity of the left pubic rami compatible with remote healed fractures. IMPRESSION: 1. Substantially decreased activity in the right palatine tonsil and left palatine tonsil, compatible with treatment response. 2. Improved conglomerate right level IIA adenopathy, index node currently 1.4 cm in short axis with maximum SUV 3.4, previously 2.6 cm in short axis with maximum SUV 11.6. 3. Mildly accentuated activity along the tongue base, maximum SUV 4.6. This could be physiologic but is technically nonspecific. 4. Ground-glass density nodularity in the right upper lobe anteriorly measuring 1.1 by 0.6 cm with maximum SUV 3.2. This was not present 12/16/2023 and is most likely inflammatory based on morphology and previous absence, surveillance suggested. 5. Nonobstructive right nephrolithiasis. 6. Coronary artery disease with focal calcification in the left anterior descending coronary artery. 7. Degenerative right sternoclavicular arthropathy. 8. Anterior pelvic hernia mesh. 9. Scattered diverticula of the sigmoid colon. 10. Deformity of the left pubic rami compatible with remote healed fractures. 11.  Aortic  Atherosclerosis (ICD10-I70.0). Electronically Signed   By: Ryan Salvage M.D.   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 Faint brightness in throat and smaller lymph node consistent with scar tissue. No masses detected. Extremely low recurrence risk. - Schedule follow-up with ENT in three months. - Order CT scan of neck and chest in about 6 months and follow-up in radiation oncology at that time. - Advise sunscreen and sun protection for skin healing.  Mucositis due to chemotherapy and radiation Two small white patches on right palate likely mucositis from treatment, contributing to sore throat. Expected to improve.  Sinusitis Recovering from sinus infection, contributing to sore throat, particularly on right side. Symptoms improving.  Nonobstructive right nephrolithiasis Small nonobstructive, asymptomatic kidney stone on right side. - No intervention unless symptoms like flank pain develop.  Advised to discuss this with his PCP  Coronary artery disease Coronary artery disease noted on imaging, likely age-related calcification. No chest pain or tightness. - Discuss findings with primary care  physician for further management.  Diverticulosis of colon Presence of diverticula in colon, not inflamed. No diverticulitis signs. - Continue regular colonoscopies per guidelines.  Advised to discuss this with his PCP  Swallowing: He denies issues with swallowing.  Continue exercises at home  Dental: Encouraged to continue regular followup with dentistry, and dental hygiene including fluoride rinses.   Thyroid  function: Med onc will check annually.  Lab Results  Component Value Date   TSH 0.822 04/22/2023   Nutrition: No complaints, eating  well, PEG tube removed  On date of service, in total, I spent 45 minutes on this encounter. Patient was seen in person. -----------------------------------  Lauraine Golden, MD

## 2023-10-02 ENCOUNTER — Other Ambulatory Visit: Payer: Self-pay

## 2023-10-04 ENCOUNTER — Other Ambulatory Visit: Payer: Self-pay

## 2023-10-04 DIAGNOSIS — C099 Malignant neoplasm of tonsil, unspecified: Secondary | ICD-10-CM

## 2023-10-04 DIAGNOSIS — C024 Malignant neoplasm of lingual tonsil: Secondary | ICD-10-CM

## 2023-10-13 ENCOUNTER — Other Ambulatory Visit: Payer: Self-pay | Admitting: Oncology

## 2023-10-13 ENCOUNTER — Encounter: Payer: Self-pay | Admitting: Oncology

## 2023-10-13 ENCOUNTER — Inpatient Hospital Stay: Attending: Oncology

## 2023-10-13 ENCOUNTER — Inpatient Hospital Stay (HOSPITAL_BASED_OUTPATIENT_CLINIC_OR_DEPARTMENT_OTHER): Admitting: Oncology

## 2023-10-13 VITALS — BP 134/86 | HR 63 | Temp 98.5°F | Resp 17 | Ht 72.0 in | Wt 174.0 lb

## 2023-10-13 DIAGNOSIS — C024 Malignant neoplasm of lingual tonsil: Secondary | ICD-10-CM | POA: Insufficient documentation

## 2023-10-13 DIAGNOSIS — Z923 Personal history of irradiation: Secondary | ICD-10-CM | POA: Insufficient documentation

## 2023-10-13 DIAGNOSIS — N200 Calculus of kidney: Secondary | ICD-10-CM | POA: Diagnosis not present

## 2023-10-13 DIAGNOSIS — D701 Agranulocytosis secondary to cancer chemotherapy: Secondary | ICD-10-CM

## 2023-10-13 DIAGNOSIS — L905 Scar conditions and fibrosis of skin: Secondary | ICD-10-CM | POA: Diagnosis not present

## 2023-10-13 DIAGNOSIS — I2510 Atherosclerotic heart disease of native coronary artery without angina pectoris: Secondary | ICD-10-CM | POA: Diagnosis not present

## 2023-10-13 DIAGNOSIS — R918 Other nonspecific abnormal finding of lung field: Secondary | ICD-10-CM | POA: Diagnosis not present

## 2023-10-13 DIAGNOSIS — J984 Other disorders of lung: Secondary | ICD-10-CM | POA: Diagnosis not present

## 2023-10-13 DIAGNOSIS — Z882 Allergy status to sulfonamides status: Secondary | ICD-10-CM | POA: Insufficient documentation

## 2023-10-13 DIAGNOSIS — L03115 Cellulitis of right lower limb: Secondary | ICD-10-CM

## 2023-10-13 DIAGNOSIS — T451X5A Adverse effect of antineoplastic and immunosuppressive drugs, initial encounter: Secondary | ICD-10-CM

## 2023-10-13 DIAGNOSIS — Z79899 Other long term (current) drug therapy: Secondary | ICD-10-CM | POA: Diagnosis not present

## 2023-10-13 DIAGNOSIS — K5730 Diverticulosis of large intestine without perforation or abscess without bleeding: Secondary | ICD-10-CM | POA: Diagnosis not present

## 2023-10-13 DIAGNOSIS — I700 Atherosclerosis of aorta: Secondary | ICD-10-CM | POA: Diagnosis not present

## 2023-10-13 DIAGNOSIS — Z95828 Presence of other vascular implants and grafts: Secondary | ICD-10-CM

## 2023-10-13 LAB — CMP (CANCER CENTER ONLY)
ALT: 20 U/L (ref 0–44)
AST: 24 U/L (ref 15–41)
Albumin: 4.1 g/dL (ref 3.5–5.0)
Alkaline Phosphatase: 55 U/L (ref 38–126)
Anion gap: 7 (ref 5–15)
BUN: 18 mg/dL (ref 8–23)
CO2: 25 mmol/L (ref 22–32)
Calcium: 9.3 mg/dL (ref 8.9–10.3)
Chloride: 103 mmol/L (ref 98–111)
Creatinine: 1.02 mg/dL (ref 0.61–1.24)
GFR, Estimated: >60 mL/min (ref >60)
Glucose, Bld: 105 mg/dL — ABNORMAL HIGH (ref 70–99)
Potassium: 4.1 mmol/L (ref 3.5–5.1)
Sodium: 135 mmol/L (ref 135–145)
Total Bilirubin: 0.4 mg/dL (ref 0.0–1.2)
Total Protein: 6.7 g/dL (ref 6.5–8.1)

## 2023-10-13 LAB — CBC WITH DIFFERENTIAL (CANCER CENTER ONLY)
Abs Immature Granulocytes: 0.01 K/uL (ref 0.00–0.07)
Basophils Absolute: 0 K/uL (ref 0.0–0.1)
Basophils Relative: 1 %
Eosinophils Absolute: 0.1 K/uL (ref 0.0–0.5)
Eosinophils Relative: 3 %
HCT: 32.4 % — ABNORMAL LOW (ref 39.0–52.0)
Hemoglobin: 11.4 g/dL — ABNORMAL LOW (ref 13.0–17.0)
Immature Granulocytes: 0 %
Lymphocytes Relative: 7 %
Lymphs Abs: 0.2 K/uL — ABNORMAL LOW (ref 0.7–4.0)
MCH: 33.2 pg (ref 26.0–34.0)
MCHC: 35.2 g/dL (ref 30.0–36.0)
MCV: 94.5 fL (ref 80.0–100.0)
Monocytes Absolute: 0.5 K/uL (ref 0.1–1.0)
Monocytes Relative: 15 %
Neutro Abs: 2.5 K/uL (ref 1.7–7.7)
Neutrophils Relative %: 74 %
Platelet Count: 191 K/uL (ref 150–400)
RBC: 3.43 MIL/uL — ABNORMAL LOW (ref 4.22–5.81)
RDW: 11.1 % — ABNORMAL LOW (ref 11.5–15.5)
WBC Count: 3.4 K/uL — ABNORMAL LOW (ref 4.0–10.5)
nRBC: 0 % (ref 0.0–0.2)

## 2023-10-13 LAB — TSH: TSH: 0.882 u[IU]/mL (ref 0.350–4.500)

## 2023-10-13 LAB — MAGNESIUM: Magnesium: 1.8 mg/dL (ref 1.7–2.4)

## 2023-10-13 MED ORDER — SODIUM CHLORIDE 0.9% FLUSH
10.0000 mL | Freq: Once | INTRAVENOUS | Status: AC
  Administered 2023-10-13: 10 mL

## 2023-10-13 MED ORDER — HEPARIN SOD (PORK) LOCK FLUSH 100 UNIT/ML IV SOLN
500.0000 [IU] | Freq: Once | INTRAVENOUS | Status: AC
  Administered 2023-10-13: 500 [IU]

## 2023-10-13 NOTE — Assessment & Plan Note (Signed)
 White count has been fluctuating and it is slightly low today at 3400.  ANC 2500.  Hemoglobin slowly improving and it is 11.4 today.  Will continue to monitor.

## 2023-10-13 NOTE — Progress Notes (Signed)
 [facility]  ONCOLOGY CLINIC PROGRESS NOTE   Patient Care Team: [name], MD as PCP - General (Family Medicine) [name], MD as Attending Physician (Radiation Oncology) [name], MD as Consulting Physician (Otolaryngology) Malmfelt, Delon CROME, RN as Oncology Nurse Navigator [name], MD as Consulting Physician (Oncology)  PATIENT NAME: Randy Kaufman   MR#: [id number] DOB: 1961-02-04  Date of visit: 10/13/2023   ASSESSMENT & PLAN:   Randy Kaufman is a 63 y.o. 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 Unknown p16 status.  Treated with concurrent chemoradiation with weekly cisplatin .  Lingual tonsil carcinoma (HCC) - Please review oncology history for additional details and timeline of events.  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 Unknown p16 status.  Clinically cT2,cN1,cM0.  There is concern for extracapsular extension based on imaging.  Discussed his case in tumor conference on 04/21/2023. Given the size and nature of the lymph node with concern for extracapsular extension, he has a high likelihood of requiring adjuvant chemoradiation if removed surgically.  Hence consensus opinion is to proceed with concurrent chemoradiation.  It was decided to not pursue another biopsy in an attempt to determine p16 status, since the treatment plan would not change.  -Previously I discussed tumor board recommendations with the patient.  He was agreeable to proceeding with concurrent chemoradiation. We have discussed about role of cisplatin  being a radiosensitizer in the treatment of head and neck cancer.  We have discussed about the curative intent of chemoradiation for this patient.  Patient was willing to proceed with weekly cisplatin .  He started radiation from 05/10/2023.  Started chemotherapy with cisplatin  from 05/14/2023 and completed dose #7 on 06/25/2023.  Because of cytopenias, we started dose reducing cisplatin  to 30 mg/m starting from cycle 5 onwards.  He has recovered well from chemoradiation related side effects.  On 09/27/2023, restaging PET scan showed substantially decreased activity in the right palatine tonsil and left palatine tonsil, compatible with treatment response. Improved conglomerate right level IIA adenopathy, index node currently 1.4 cm in short axis with maximum SUV 3.4, previously 2.6 cm in short axis with maximum SUV 11.6. Mildly accentuated activity along the tongue base, maximum SUV 4.6. This could be physiologic but is technically nonspecific. Ground-glass density nodularity in the right upper lobe anteriorly measuring 1.1 by 0.6 cm with maximum SUV 3.2. This was not present 12/16/2023 and is most likely inflammatory based on morphology and previous absence, surveillance suggested. Nonobstructive right nephrolithiasis. Coronary artery disease with focal calcification in the left anterior descending coronary artery.  Clinically he is in remission.  Faint brightness in throat and smaller lymph node is most likely scar tissue. No masses detected.   He will follow-up with ENT (Dr. Jesus) in approximately 3 months.  Radiation oncology department has requested CT of the neck and chest in about 6 months from the last scan.  Going forward we will continue surveillance as per NCCN guidelines.   RTC in 4 months for follow-up with repeat labs.  Cellulitis of right foot This has resolved.  No longer an issue.  Leukopenia due to antineoplastic chemotherapy (HCC) White count has been fluctuating and it is slightly low today at 3400.  ANC 2500.  Hemoglobin slowly improving and it is 11.4 today.  Will continue  to monitor.   He previously had renal dysfunction  which has resolved now with adequate hydration.  He was encouraged to maintain adequate hydration.  I reviewed lab results and outside records for this visit and discussed relevant results with the patient. Diagnosis, plan of care and treatment options were also discussed in detail with the patient. Opportunity provided to ask questions and answers provided to his apparent satisfaction. Provided instructions to call our clinic with any problems, questions or concerns prior to return visit. I recommended to continue follow-up with PCP and sub-specialists. He verbalized understanding and agreed with the plan.   NCCN guidelines have been consulted in the planning of this patient's care.  I spent a total of 30 minutes during this encounter with the patient including review of chart and various tests results, discussions about plan of care and coordination of care plan.  Chinita Patten, MD  10/13/2023 2:37 PM  [facility] advancing approximately 2 months for follow-up with repeat labs. [facility] - [facility] DE[facility] [address] [place] [address] Dept: [phone] Dept Fax: [phone]    CHIEF COMPLAINT/ REASON FOR VISIT:   Squamous cell carcinoma consistent with a right lingual tonsillar primary with a metastatic ipsilateral cervical node. Clinically cT2,cN1,cM0.   Current Treatment: Concurrent chemoradiation with weekly cisplatin  started during the week of 05/10/2023. Completed dose #7 on 06/25/2023.  Because of cytopenias, we started dose reducing cisplatin  to 30 mg/m starting from cycle 5 onwards. Completed radiation around the same time.  INTERVAL HISTORY:    Discussed the use of AI scribe software for clinical note transcription with the patient, who gave verbal consent to proceed.  History of Present Illness Randy Kaufman is a 63 year old male who presents with redness and swelling in his foot.  He experienced redness  and swelling in his foot last week, which was severe enough to prevent him from walking, necessitating the use of a walker. He attempted to refill an antibiotic previously prescribed but did not receive a response. He noticed he could refill prednisone , which he has been taking as per a tapering schedule: two tablets daily for seven days, now reduced to one and a half tablets. This treatment has helped, and he is now able to walk again. No fevers, chills, or night sweats.  He reports eating well and has not used his feeding tube for over fifteen days, maintaining it only for hygiene purposes. 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 He is consuming a lot of water, noting his urine is a slight yellow, indicating good hydration and kidney function.  He is currently taking magnesium  as a supplement, as his insurance does not cover the prescribed form. He has resumed taking a Centrum supplement and is eating a varied diet, avoiding only spicy foods for now. He remains active, having mowed the grass with a push mower recently.   Randy Kaufman is a 63 year old male who presents for follow-up after a PET scan.  The PET scan results were clear, showing some scar tissue but no signs of active cancer. He cannot feel the lump anymore, which he finds reassuring.  He has been experiencing sinus issues and completed a course of a sulfur-based antibiotic. He is considering using over-the-counter Mucinex and has tried Sudafed and steam inhalation in warm showers to alleviate symptoms.  His blood counts show a low white blood  cell count, but his hemoglobin is 11.4, and his platelets are normal. Kidney, liver numbers, and electrolytes are normal. Thyroid  function tests are pending.  He reports occasional difficulty swallowing on the right side and notes that he eats slower than before. He stays hydrated and has no current foot issues.  He mentions weight loss, as evidenced by his pants  being too big, but he is not planning to lose more weight and aims to maintain his current weight.   I have reviewed the past medical history, past surgical history, social history and family history with the patient and they are unchanged from previous note.  HISTORY OF PRESENT ILLNESS:   ONCOLOGY HISTORY:   He presented to his dentist last year with c/o of a small lump to his right neck. He was subsequently referred to Dr. Jesus at [facility], on 04/02/23 for further evaluation. Physical exam performed at that time noted a palpable 4 cm right level 2 neck mass. No other masses were palpated.    A biopsy of the right neck mass was collected that date (04/02/23) and showed findings consistent with metastatic squamous cell carcinoma.  There was insufficient tissue to check for p16 status.   Staging PET scan on 04/14/22 revealed hypermetabolic activity at the right lingual tonsil and right level II node. The enlarged lymph node appeared to measure approximately 2.6 cm in greatest dimension. No evidence of distant metastatic disease was seen.    He later had CT soft [facility] neck at [facility].   Unknown p16 status.  Clinically cT2,cN1,cM0.  There is concern for extracapsular extension based on imaging.   Discussed his case in tumor conference on 04/21/2023. Given the size and nature of the lymph node with concern for extracapsular extension, he has a high likelihood of requiring adjuvant chemoradiation if removed surgically.  Hence consensus opinion was to proceed with concurrent chemoradiation. It was decided to not pursue another biopsy in an attempt to determine p16 status, since the treatment plan would not change.  He started radiation treatments from 05/10/2023.  Started chemotherapy with cisplatin  from 05/14/2023 and completed dose #7 on 06/25/2023.  Because of cytopenias, we started dose reducing cisplatin  to 30 mg/m starting from cycle 5 onwards. Completed radiation around the  same time.  On 09/27/2023, restaging PET scan showed substantially decreased activity in the right palatine tonsil and left palatine tonsil, compatible with treatment response. Improved conglomerate right level IIA adenopathy, index node currently 1.4 cm in short axis with maximum SUV 3.4, previously 2.6 cm in short axis with maximum SUV 11.6. Mildly accentuated activity along the tongue base, maximum SUV 4.6. This could be physiologic but is technically nonspecific. Ground-glass density nodularity in the right upper lobe anteriorly measuring 1.1 by 0.6 cm with maximum SUV 3.2. This was not present 12/16/2023 and is most likely inflammatory based on morphology and previous absence, surveillance suggested. Nonobstructive right nephrolithiasis. Coronary artery disease with focal calcification in the left anterior descending coronary artery.  Oncology History  Lingual tonsil carcinoma (HCC)  04/20/2023 Initial Diagnosis   Lingual tonsil carcinoma (HCC)   04/22/2023 Cancer Staging   Staging form: Pharynx - HPV-Mediated Oropharynx, AJCC 8th Edition - Clinical: Stage I (cT2, cN1, cM0, p16: Unknown, HPV: Unknown) - Signed by [name], MD on 04/22/2023   05/14/2023 -  Chemotherapy   Patient is on Treatment Plan : HEAD/NECK Cisplatin  (40) q7d         REVIEW OF SYSTEMS:   Review of Systems -  Oncology  All other pertinent systems were reviewed with the patient and are negative.  ALLERGIES: He is allergic to sulfa  antibiotics.  MEDICATIONS:  Current Outpatient Medications  Medication Sig Dispense Refill   ascorbic acid (VITAMIN C) 1000 MG tablet Take 1,000 mg by mouth daily.     finasteride (PROSCAR) 5 MG tablet Take 5 mg by mouth daily. Takes 1/2 Tab to equal 2.5 mg daily     Multiple Vitamin (MULTI-VITAMIN) tablet Take 1 tablet by mouth daily.     TRUE MAGNESIUM  OXIDE 400 MG tablet TAKE 1 TABLET BY MOUTH EVERY DAY 90 tablet 1   No current facility-administered medications for this visit.      VITALS:   Blood pressure 134/86, pulse 63, temperature 98.5 F (36.9 C), temperature source Temporal, resp. rate 17, height 6' (1.829 m), weight 174 lb (78.9 kg), SpO2 100%.  Wt Readings from Last 3 Encounters:  10/13/23 174 lb (78.9 kg)  10/01/23 175 lb (79.4 kg)  08/12/23 183 lb 9.6 oz (83.3 kg)    Body mass index is 23.6 kg/m.    Onc Performance Status - 10/13/23 1220       ECOG Perf Status   ECOG Perf Status Fully active, able to carry on all pre-disease performance without restriction      KPS SCALE   KPS % SCORE Normal, no compliants, no evidence of disease          PHYSICAL EXAM:   Physical Exam Constitutional:      General: He is not in acute distress.    Appearance: Normal appearance.  HENT:     Head: Normocephalic and atraumatic.     Mouth/Throat:     Comments: No obvious abnormalities in the visualized portion of oral cavity Eyes:     General: No scleral icterus.    Conjunctiva/sclera: Conjunctivae normal.  Cardiovascular:     Rate and Rhythm: Normal rate and regular rhythm.     Pulses: Normal pulses.     Heart sounds: Normal heart sounds.  Pulmonary:     Effort: Pulmonary effort is normal.     Breath sounds: Normal breath sounds.  Chest:     Comments: Right-sided Port-A-Cath in place, without any evidence of infection Abdominal:     General: There is no distension.  Musculoskeletal:     Comments: Erythema resolved in bilateral feet.  No tenderness like before.   No purple discoloration now.  Distal pulses intact bilaterally  Lymphadenopathy:     Cervical: Cervical adenopathy (Significantly improved and softer compared to prior) present.  Neurological:     General: No focal deficit present.     Mental Status: He is alert and oriented to person, place, and time.  Psychiatric:        Mood and Affect: Mood normal.        Behavior: Behavior normal.        Thought Content: Thought content normal.      LABORATORY DATA:   I have  reviewed the data as listed.  Results for orders placed or performed in visit on 10/13/23  CBC with Differential (Cancer Center Only)  Result Value Ref Range   WBC Count 3.4 (L) 4.0 - 10.5 K/uL   RBC 3.43 (L) 4.22 - 5.81 MIL/uL   Hemoglobin 11.4 (L) 13.0 - 17.0 g/dL   HCT 67.5 (L) 60.9 - 47.9 %   MCV 94.5 80.0 - 100.0 fL   MCH 33.2 26.0 - 34.0 pg   MCHC 35.2 30.0 -  36.0 g/dL   RDW 88.8 (L) 88.4 - 84.4 %   Platelet Count 191 150 - 400 K/uL   nRBC 0.0 0.0 - 0.2 %   Neutrophils Relative % 74 %   Neutro Abs 2.5 1.7 - 7.7 K/uL   Lymphocytes Relative 7 %   Lymphs Abs 0.2 (L) 0.7 - 4.0 K/uL   Monocytes Relative 15 %   Monocytes Absolute 0.5 0.1 - 1.0 K/uL   Eosinophils Relative 3 %   Eosinophils Absolute 0.1 0.0 - 0.5 K/uL   Basophils Relative 1 %   Basophils Absolute 0.0 0.0 - 0.1 K/uL   Immature Granulocytes 0 %   Abs Immature Granulocytes 0.01 0.00 - 0.07 K/uL  CMP (Cancer Center only)  Result Value Ref Range   Sodium 135 135 - 145 mmol/L   Potassium 4.1 3.5 - 5.1 mmol/L   Chloride 103 98 - 111 mmol/L   CO2 25 22 - 32 mmol/L   Glucose, Bld 105 (H) 70 - 99 mg/dL   BUN 18 8 - 23 mg/dL   Creatinine 8.97 9.38 - 1.24 mg/dL   Calcium 9.3 8.9 - 89.6 mg/dL   Total Protein 6.7 6.5 - 8.1 g/dL   Albumin 4.1 3.5 - 5.0 g/dL   AST 24 15 - 41 U/L   ALT 20 0 - 44 U/L   Alkaline Phosphatase 55 38 - 126 U/L   Total Bilirubin 0.4 0.0 - 1.2 mg/dL   GFR, Estimated >39 >39 mL/min   Anion gap 7 5 - 15  Magnesium   Result Value Ref Range   Magnesium  1.8 1.7 - 2.4 mg/dL      RADIOGRAPHIC STUDIES:  XR Shoulder Right Radiograph of his right shoulder demonstrate degenerative changes of the  AC joint.  Mild degenerative changes of the glenohumeral joint.  No  appreciated fractures  PET Scan 09/27/2023  CLINICAL DATA:  Subsequent treatment strategy for tonsillar cancer.   EXAM: NUCLEAR MEDICINE PET SKULL BASE TO THIGH   TECHNIQUE: 8.3 mCi F-18 FDG was injected intravenously. Full-ring  PET imaging was performed from the skull base to thigh after the radiotracer. CT data was obtained and used for attenuation correction and anatomic localization.   Fasting blood glucose: 91 mg/dl   COMPARISON:  98/90/7974   FINDINGS: Mediastinal blood pool activity: SUV max 2.3   Liver activity: SUV max NA   NECK: Right palatine tonsillar activity substantially decreased, formerly 10.6 and currently 3.2. There is previously a left palatine tonsillar activity as well with maximum SUV 5.8, currently 2.8.   On today's exam there is mildly accentuated activity along the tongue base, maximum SUV 4.6. This could be physiologic but is technically nonspecific.   Improved conglomerate right level IIa adenopathy, index node currently 1.4 cm in short axis with maximum SUV 3.4, previously 2.6 cm in short axis with maximum SUV 11.6.   Small focus of activity in the vicinity of the right inferior constrictor, maximum SUV 4.6 and previously 5.4 in this region, probably incidental.   Incidental CT findings: None.   CHEST: No significant abnormal hypermetabolic activity in this region.   Incidental CT findings: Right Port-A-Cath tip: Cavoatrial junction.   Left anterior descending coronary artery atheromatous vascular calcification on image 96 series 4, compatible with coronary artery disease. Mild aortic arch atheromatous vascular calcification.   Ground-glass density nodularity in the right upper lobe anteriorly on image 18 series 7 measuring 1.1 by 0.6 cm with maximum SUV 3.2. This was not present 12/16/2023 and is  most likely inflammatory based on morphology and previous absence, surveillance suggested.   ABDOMEN/PELVIS: Physiologic activity in bowel.   Incidental CT findings: Nonobstructive right nephrolithiasis. Cholecystectomy. Anterior pelvic hernia mesh. Scattered diverticula of the sigmoid colon.   SKELETON: No significant abnormal hypermetabolic activity in  this region.   Incidental CT findings: Degenerative right sternoclavicular arthropathy. Deformity of the left pubic rami compatible with remote healed fractures.   IMPRESSION: 1. Substantially decreased activity in the right palatine tonsil and left palatine tonsil, compatible with treatment response. 2. Improved conglomerate right level IIA adenopathy, index node currently 1.4 cm in short axis with maximum SUV 3.4, previously 2.6 cm in short axis with maximum SUV 11.6. 3. Mildly accentuated activity along the tongue base, maximum SUV 4.6. This could be physiologic but is technically nonspecific. 4. Ground-glass density nodularity in the right upper lobe anteriorly measuring 1.1 by 0.6 cm with maximum SUV 3.2. This was not present 12/16/2023 and is most likely inflammatory based on morphology and previous absence, surveillance suggested. 5. Nonobstructive right nephrolithiasis. 6. Coronary artery disease with focal calcification in the left anterior descending coronary artery. 7. Degenerative right sternoclavicular arthropathy. 8. Anterior pelvic hernia mesh. 9. Scattered diverticula of the sigmoid colon. 10. Deformity of the left pubic rami compatible with remote healed fractures. 11.  Aortic Atherosclerosis (ICD10-I70.0).     Electronically Signed   By: Ryan Salvage M.D.   On: 09/28/2023 09:21    CODE STATUS:  Code Status History     Date Active Date Inactive Code Status Order ID Comments User Context   05/07/2023 1642 05/08/2023 0512 Full Code [id number]  Hughes Simmonds, MD [facility]   05/07/2023 1642 05/07/2023 1642 Full Code [id number]  Hughes Simmonds, MD [facility]    Questions for Most Recent Historical Code Status (Order [id number])     Question Answer   By: Consent: discussion documented in EHR            Orders Placed This Encounter  Procedures   CBC with Differential (Cancer Center Only)    Standing Status:   Future    Expected Date:   02/13/2024    Expiration Date:    05/13/2024   CMP (Cancer Center only)    Standing Status:   Future    Expected Date:   02/13/2024    Expiration Date:   05/13/2024   Magnesium     Standing Status:   Future    Expected Date:   02/13/2024    Expiration Date:   05/13/2024     Future Appointments  Date Time Provider Department Center  11/04/2023  9:15 AM Persons, Ronal Dragon, [place] [facility] None  11/22/2023  4:15 PM Jacelyn Lupita NOVAK, CCC-SLP [facility]  12/08/2023  8:15 AM [facility] None  02/02/2024  8:15 AM [facility] None  02/02/2024  8:45 AM Maryuri Warnke, Chinit[place], MD [facility] None  03/13/2024  8:20 AM [name], MD [facility] None      This document was completed utilizing speech recognition software. Grammatical errors, random word insertions, pronoun errors, and incomplete sentences are an occasional consequence of this system due to software limitations, ambient noise, and hardware issues. Any formal questions or concerns about the content, text or information contained within the body of this dictation should be directly addressed to the provider for clarification.

## 2023-10-13 NOTE — Assessment & Plan Note (Signed)
This has resolved.  No longer an issue

## 2023-10-13 NOTE — Assessment & Plan Note (Addendum)
-   Please review oncology history for additional details and timeline of events.  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 Unknown p16 status.  Clinically cT2,cN1,cM0.  There is concern for extracapsular extension based on imaging.  Discussed his case in tumor conference on 04/21/2023. Given the size and nature of the lymph node with concern for extracapsular extension, he has a high likelihood of requiring adjuvant chemoradiation if removed surgically.  Hence consensus opinion is to proceed with concurrent chemoradiation.  It was decided to not pursue another biopsy in an attempt to determine p16 status, since the treatment plan would not change.  -Previously I discussed tumor board recommendations with the patient.  He was agreeable to proceeding with concurrent chemoradiation. We have discussed about role of cisplatin  being a radiosensitizer in the treatment of head and neck cancer.  We have discussed about the curative intent of chemoradiation for this patient. Patient was willing to proceed with weekly cisplatin .  He started radiation from 05/10/2023.  Started chemotherapy with cisplatin  from 05/14/2023 and completed dose #7 on 06/25/2023.  Because of cytopenias, we started dose reducing cisplatin  to 30 mg/m starting from cycle 5 onwards.  He has recovered well from chemoradiation related side effects.  On 09/27/2023, restaging PET scan showed substantially decreased activity in the right palatine tonsil and left palatine tonsil, compatible with treatment response. Improved conglomerate right level IIA adenopathy, index node currently 1.4 cm in short axis with maximum SUV 3.4, previously 2.6 cm in short axis with maximum SUV 11.6. Mildly accentuated activity along the tongue base, maximum SUV 4.6. This could be physiologic but is technically nonspecific. Ground-glass density nodularity in the  right upper lobe anteriorly measuring 1.1 by 0.6 cm with maximum SUV 3.2. This was not present 12/16/2023 and is most likely inflammatory based on morphology and previous absence, surveillance suggested. Nonobstructive right nephrolithiasis. Coronary artery disease with focal calcification in the left anterior descending coronary artery.  Clinically he is in remission.  Faint brightness in throat and smaller lymph node is most likely scar tissue. No masses detected.   He will follow-up with ENT (Dr. Jesus) in approximately 3 months.  Radiation oncology department has requested CT of the neck and chest in about 6 months from the last scan.  Going forward we will continue surveillance as per NCCN guidelines.   RTC in 4 months for follow-up with repeat labs.

## 2023-10-14 ENCOUNTER — Encounter: Payer: Self-pay | Admitting: Oncology

## 2023-10-25 ENCOUNTER — Other Ambulatory Visit: Payer: Self-pay | Admitting: *Deleted

## 2023-10-25 NOTE — Progress Notes (Signed)
 The proposed treatment discussed in conference is for discussion purpose only and is not a binding recommendation.  The patients have not been physically examined, or presented with their treatment options.  Therefore, final treatment plans cannot be decided.

## 2023-11-04 ENCOUNTER — Ambulatory Visit: Admitting: Physician Assistant

## 2023-11-04 ENCOUNTER — Encounter (HOSPITAL_BASED_OUTPATIENT_CLINIC_OR_DEPARTMENT_OTHER): Payer: Self-pay | Admitting: Physician Assistant

## 2023-11-04 ENCOUNTER — Ambulatory Visit (HOSPITAL_BASED_OUTPATIENT_CLINIC_OR_DEPARTMENT_OTHER)

## 2023-11-04 ENCOUNTER — Ambulatory Visit (INDEPENDENT_AMBULATORY_CARE_PROVIDER_SITE_OTHER): Admitting: Physician Assistant

## 2023-11-04 DIAGNOSIS — M79604 Pain in right leg: Secondary | ICD-10-CM

## 2023-11-04 NOTE — Progress Notes (Signed)
 Office Visit Note   Patient: Randy Kaufman           Date of Birth: 09-24-1960           MRN: [id number] Visit Date: 11/04/2023              Requested by: [name], MD [address] [place],  [place] [address] PCP: [name], MD  Chief Complaint  Patient presents with   Right Shoulder - Follow-up      HPI: Patient is a pleasant 63 year old gentleman follows up on his right shoulder he says he is doing much better no constant pain can sleep a little he is able to benchpress doing a little dumbbell curl now has some pain.  He has good range of motion.  He is not taking any medication.  He is here however also to look at his right leg.  He has a history of a ORIF of his right tib-fib.  He feels like the hardware is protruding.  Assessment & Plan: Visit Diagnoses:  1. Right leg pain     Plan: With regards to his shoulder he is doing quite well.  He does still have a little pain but he has been working on exercise and he did do well from the injection.  With regards to his right lower leg I did review the x-rays he does have fracture of the screws at the distal end of the tibial plate x 4.  It has become more prominent and he sometimes gets scabbing.  Discussed with him possible removal of hardware which she is requesting.  Reviewed the risks of the surgery including bleeding infection anesthesia complications need for future surgery.  Reviewed the recovery from this.  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  He understands he would have to have a clearance both from his primary care doctor and his oncologist that he is healthy to go through the surgery we will contact him with regards to scheduling  Follow-Up Instructions: To schedule surgery  Ortho Exam  Patient is alert, oriented, no adenopathy, well-dressed, normal affect, normal respiratory effort. Examination of his right shoulder full range of motion better strength less pain  neurovascular intact. Right lower leg will well-healed surgical incision.  Pulses are intact he has good dorsiflexion plantarflexion compartments are soft and compressible he does have prominence with some skin tenting at the distal end of his plate.    Imaging: No results found. No images are attached to the encounter.  Labs: Lab Results  Component Value Date   LABURIC 6.0 05/26/2023   REPTSTATUS 06/16/2008 FINAL 06/15/2008   CULT NO GROWTH 06/15/2008     Lab Results  Component Value Date   ALBUMIN 4.1 10/13/2023   ALBUMIN 4.0 07/15/2023   ALBUMIN 3.3 (L) 07/01/2023    Lab Results  Component Value Date   MG 1.8 10/13/2023   MG 2.1 08/12/2023   MG 2.1 07/15/2023   No results found for: VD25OH  No results found for: PREALBUMIN    Latest Ref Rng & Units 10/13/2023   11:27 AM 08/12/2023   11:26 AM 07/15/2023   11:30 AM  CBC EXTENDED  WBC 4.0 - 10.5 K/uL 3.4  5.0  5.8   RBC 4.22 - 5.81 MIL/uL 3.43  3.02  3.06   Hemoglobin 13.0 - 17.0 g/dL 88.5  89.9  9.8   HCT 60.9 - 52.0 % 32.4  28.9  27.9   Platelets  150 - 400 K/uL 191  267  153   NEUT# 1.7 - 7.7 K/uL 2.5  4.4  5.3   Lymph# 0.7 - 4.0 K/uL 0.2  0.2  0.1      There is no height or weight on file to calculate BMI.  Orders:  Orders Placed This Encounter  Procedures   DG Tibia/Fibula Right   No orders of the defined types were placed in this encounter.    Procedures: No procedures performed  Clinical Data: No additional findings.  ROS:  All other systems negative, except as noted in the HPI. Review of Systems  Objective: Vital Signs: There were no vitals taken for this visit.  Specialty Comments:  No specialty comments available.  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 No pertinent family history.  Past Surgical History:  Procedure Laterality Date   CHOLECYSTECTOMY     HERNIA REPAIR     IR GASTROSTOMY TUBE MOD SED  05/07/2023   IR GASTROSTOMY TUBE REMOVAL  08/20/2023   IR IMAGING GUIDED PORT INSERTION  05/07/2023   IR NASO G TUBE PLC W/FL W/RAD  05/07/2023   Social History   Occupational History   Not on file  Tobacco Use   Smoking status: Never   Smokeless tobacco: Not on file  Substance and Sexual Activity   Alcohol use: No   Drug use: No   Sexual activity: Not on file

## 2023-11-12 ENCOUNTER — Telehealth (HOSPITAL_BASED_OUTPATIENT_CLINIC_OR_DEPARTMENT_OTHER): Payer: Self-pay | Admitting: Physician Assistant

## 2023-11-12 NOTE — Telephone Encounter (Signed)
 Patient wants to know if his orders has been sent for surgery

## 2023-11-22 ENCOUNTER — Ambulatory Visit: Attending: Radiation Oncology

## 2023-11-22 DIAGNOSIS — R1310 Dysphagia, unspecified: Secondary | ICD-10-CM | POA: Diagnosis present

## 2023-11-22 NOTE — Patient Instructions (Addendum)
   CONTINUE TO DO YOUR EXERCISES UNTIL mid October, THEN TWICE A WEEK (once a day) AFTER THAT (approx 20 reps/day)   Signs you may have developed trouble with your swallowing:  Items you used to eat or drink well,  you now have difficulty passing through the throat or are choking or coughing  when  you eat or drink them  You are clearing your throat often when you are eating or drinking  You have a wet voice when you are eating or drinking  If you have one or more of these signs contact your ENT or Dr. Izell (if she is still following you)

## 2023-11-22 NOTE — Therapy (Signed)
 OUTPATIENT SPEECH LANGUAGE PATHOLOGY ONCOLOGY TREATMENT/ RECERT-DISCHARGE   Patient Name: Randy Kaufman MRN: [id number] DOB:January 04, 1961, 63 y.o., male Today's Date: 11/22/2023  PCP: [name] REFERRING PROVIDER: [name]  END OF SESSION:  End of Session - 11/22/23 1651     Visit Number 5    Number of Visits 7    Date for SLP Re-Evaluation 11/22/23    SLP Start Time 1610    SLP Stop Time  1642    SLP Time Calculation (min) 32 min    Activity Tolerance Patient tolerated treatment well            Past Medical History:  Diagnosis Date   Cancer (HCC)    neck and throat   Past Surgical History:  Procedure Laterality Date   CHOLECYSTECTOMY     HERNIA REPAIR     IR GASTROSTOMY TUBE MOD SED  05/07/2023   IR GASTROSTOMY TUBE REMOVAL  08/20/2023   IR IMAGING GUIDED PORT INSERTION  05/07/2023   IR NASO G TUBE PLC W/FL W/RAD  05/07/2023   Patient Active Problem List   Diagnosis Date Noted   Pain in right shoulder 09/30/2023   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   Current functional level related to goals / functional outcomes: See below. Pt mostly-adhered to SLP recommendations for HEP over the course of tx.    Remaining deficits: None noted today. Xerostomia complicates swallowing but does not impact safety.   Education / Equipment: See individual therapy notes for  details.    Patient agrees to discharge. Patient goals were partially met. Patient is being discharged due to being pleased with the current functional level..     ONSET DATE: see pertinent history below   REFERRING DIAG: lingual tonsil carcinoma  THERAPY DIAG:  Dysphagia, unspecified type  Rationale for Evaluation and Treatment: Rehabilitation  SUBJECTIVE:   SUBJECTIVE STATEMENT: [name], chicken tenders, peanut butter sandwich, [place] food, nachos, pizza. Pt's voice notably deeper than previous appointments.     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 Hx: He presented to his dentist last year with c/o a small lump to his right neck. He was referred to Dr. Jesus.04/02/23 Dr. Jesus noted a palpable 4 cm right level 2 neck mass. No other masses were palpated. He collected a biopsy that day that showed findings consistent with metastatic SCC, p 16 status not obtained due to not enough material. 04/15/23 PET revealing hypermetabolic activity at the right lingual tonsil and right level II node. The enlarged lymph appeared to measure 4-5 cm in greatest dimension. No evidence of metastatic disease was seen. 04/20/23 Consult with Dr. Izell and 04/22/23 consult with Dr. Autumn. He will receive chemotherapy with radiation.  Treatment plan:  He will receive 35 fractions of radiation to his Right tonsil and bilateral neck with weekly chemotherapy which started on 2/3  and will complete 3/21. Pretreatment procedures: 05/07/23 PEG/PAC  PAIN:  Are you having pain?  No  FALLS: Has patient fallen in last 6 months?  No   PATIENT GOALS: Maintain WNL swallowing  OBJECTIVE:  Note: Objective measures were completed at Evaluation unless otherwise noted.                                                                                                                              TREATMENT DATE:   11/22/23:  Pt reports deeper voice - today with 102Hz  average. SLP suggested to pt to schedule follow up with Dr. Jesus to verify WNL structure/function.  With POs today, pt demonstrated WNL oropharygneal swallowing. HEP completed with good to excellent procedure; pt has not been completing HEP with same frequency as prescribed for the last month (I've not been as good with that, [name]). SLP reiterated at least 5/7 days/week, at least 30 reps for [place], [name], and effortful until mid-October then twice a week with 30 reps each (except [place]). SLP provided pt s/sx of swallowing difficulty with which to alert ENT or Dr. Izell. See pt instructions. Pt agrees with d/c today.   09/20/23: PEG has been removed. Pt entered today saying he could eat peanut butter crackers. He did so with min liquid wash but no overt s/sx oral or pharyngeal deficits. He is eating desired foods at this point, not always with WNL taste.  With HEP pt req'd min A with tongue protrusion with Masako. SLP told pt to use a mirror with Masako for better adherence with this. Pt reported HEP x4/week - SLP encouraged x5 days/week. After reminding pt when he could decr to x2/week, he indicated understanding and repeated to SLP later.   07/30/23: Pt gained 3 pounds this week. Reports using PEG for cherry juice only. Pt req'd min cues for procedure with [name] (5 second hold instead of 3 second hold). SLP stressed at LEAST 20 reps/day, up to 40 reps/day, as pt was not completing 20 reps/day for each. Pt req'd cues to complete exercises apart from POs.  [name] had cereal bar and water without overt s/sx pharyngeal deficits. Pt with s/sx xerostomia and SLP suggested to drink more water if needed withPOs.  SLP provided overt s/sx aspiration PNA. SLP and pt agree that next month if things remain the same next visit should be every two months.   07/02/23: Pt is having water by mouth now- one week from last day of rad  tx. Is going to try chicken noodle soup today - SLP suggested blended up. Education today about food journal, eating food that is tolerated. Pt performed HEP with independence. He told SLP rationale for HEP with independence.  05/27/23 (eval): Research states the risk for dysphagia increases due to radiation and/or chemotherapy treatment due to a variety of factors, so SLP educated the pt about the possibility of reduced/limited ability for PO intake during rad tx. SLP also educated pt regarding  possible changes to swallowing musculature after rad tx, and why adherence to dysphagia HEP provided today and PO consumption was necessary to inhibit muscle fibrosis following rad tx and to mitigate muscle disuse atrophy. SLP informed pt why this would be detrimental to their swallowing status and to their pulmonary health. Pt demonstrated understanding of these things to SLP. SLP encouraged pt to safely eat and drink as deep into their radiation/chemotherapy as possible to provide the best possible long-term swallowing outcome for pt.  SLP then developed an individualized HEP for pt involving oral and pharyngeal strengthening and ROM and pt was instructed how to perform these exercises, including SLP demonstration. After SLP demonstration, pt return demonstrated each exercise. SLP ensured pt performance was correct prior to educating pt on next exercise. Pt required usual demo cues faded to modified independent to perform HEP. Pt was instructed to complete this program 6-7 days/week, at least 20 reps a day until 6 months after his or her last day of rad tx, and then x2 a week after that, indefinitely. Among other modifications for days when pt cannot functionally swallow, SLP also suggested pt to perform only non-swallowing tasks on the handout/HEP, and if necessary to cycle through the swallowing portion so the full program of exercises can be completed instead of fatiguing on one of the swallowing exercises and being  unable to perform the other swallowing exercises. SLP instructed that swallowing exercises should then be added back into the regimen as pt is able to do so.    PATIENT EDUCATION: Education details: see treatment today Person educated: Patient and Spouse Education method: Explanation Education comprehension: verbalized understanding   ASSESSMENT:  CLINICAL IMPRESSION: RECERT-D/C today. Patient is a 63y.o. M who was seen today for treatment of swallowing after undergoing chemoradiation therapy. Today pt ate crackers and drank thin liquids without overt s/s oral or pharyngeal difficulty. At this time pt swallowing is deemed WNL/WFL with these POs. No oral or overt s/sx pharyngeal deficits, including aspiration were observed. There are no overt s/s aspiration PNA observed by SLP nor any reported by pt at this time. Data indicate that pt's swallow ability could decrease over time following the conclusion of rad therapy due to muscle disuse atrophy and/or muscle fibrosis. Today pt presented with vocal pitch lower than WNL, average 102 Hz.  OBJECTIVE IMPAIRMENTS: include dysphagia. These impairments are limiting patient from safety when swallowing. Factors affecting potential to achieve goals and functional outcome are none. Patient will benefit from skilled SLP services to address above impairments and improve overall function.  REHAB POTENTIAL: Excellent   GOALS: Goals reviewed with patient? Generally, yes   SHORT TERM GOALS: Target: 3rd total session   1. Pt will compelte HEP with modified independence in 2 sessions Baseline: 07/02/23 Goal status: partially met   2.  pt will tell SLP why pt is completing HEP with modified independence Baseline:  Goal status: met   3.  pt will describe 3 overt s/s aspiration PNA with modified independence Baseline:  Goal status: met   4.  pt will tell SLP how a food journal could ease return to a more normalized diet Baseline:  Goal status: met      LONG TERM GOALS: Target: 7th total session   1.  pt will complete HEP with independence over two visits Baseline:  Goal status: partially met  2.  pt will describe how to modify HEP over time, and the timeline associated with reduction in HEP frequency with modified independence over  two sessions Baseline: 09/20/23 Goal status: met   PLAN:   discharge today   PLANNED INTERVENTIONS: Aspiration precaution training, Pharyngeal strengthening exercises, Diet toleration management , Trials of upgraded texture/liquids, SLP instruction and feedback, Compensatory strategies, and Patient/family education  [facility], CCC-SLP 11/22/2023, 4:52 PM

## 2023-11-26 ENCOUNTER — Other Ambulatory Visit: Payer: Self-pay

## 2023-11-26 DIAGNOSIS — C024 Malignant neoplasm of lingual tonsil: Secondary | ICD-10-CM

## 2023-11-26 NOTE — Telephone Encounter (Signed)
 I spoke with the patient once all surgerical clearances where received, and scheduled him for surgery on 12/27/23.

## 2023-12-02 ENCOUNTER — Telehealth: Payer: Self-pay | Admitting: Oncology

## 2023-12-02 NOTE — Telephone Encounter (Signed)
 Randy Kaufman called in to cancel his port flush appointment as he is getting his port removed.

## 2023-12-08 ENCOUNTER — Inpatient Hospital Stay

## 2023-12-10 ENCOUNTER — Ambulatory Visit (HOSPITAL_COMMUNITY)
Admission: RE | Admit: 2023-12-10 | Discharge: 2023-12-10 | Disposition: A | Discharge status: Home / Self Care | Source: Ambulatory Visit | Attending: Oncology | Admitting: Oncology

## 2023-12-10 DIAGNOSIS — Z452 Encounter for adjustment and management of vascular access device: Secondary | ICD-10-CM | POA: Insufficient documentation

## 2023-12-10 DIAGNOSIS — C024 Malignant neoplasm of lingual tonsil: Secondary | ICD-10-CM | POA: Diagnosis not present

## 2023-12-10 MED ORDER — LIDOCAINE-EPINEPHRINE 1 %-1:100000 IJ SOLN
20.0000 mL | Freq: Once | INTRAMUSCULAR | Status: AC
  Administered 2023-12-10: 7 mL via INTRADERMAL

## 2023-12-10 MED ORDER — LIDOCAINE HCL 1 % IJ SOLN
INTRAMUSCULAR | Status: AC
  Filled 2023-12-10: qty 20

## 2023-12-10 MED ORDER — LIDOCAINE-EPINEPHRINE 1 %-1:100000 IJ SOLN
INTRAMUSCULAR | Status: AC
  Filled 2023-12-10: qty 1

## 2023-12-10 NOTE — Procedures (Signed)
 Interventional Radiology Procedure:   Indications: History of head and neck cancer and completed treatment  Procedure: Removal of right chest port  Findings: Complete removal of right chest port  Complications: None     EBL: Minimal  Plan: Keep incision dry for 24 hours  Ligaya Cormier R. Philip, MD  Pager: [phone]

## 2023-12-20 ENCOUNTER — Encounter (HOSPITAL_BASED_OUTPATIENT_CLINIC_OR_DEPARTMENT_OTHER): Payer: Self-pay | Admitting: Orthopaedic Surgery

## 2023-12-26 ENCOUNTER — Encounter (HOSPITAL_BASED_OUTPATIENT_CLINIC_OR_DEPARTMENT_OTHER): Payer: Self-pay | Admitting: Orthopaedic Surgery

## 2023-12-26 NOTE — Anesthesia Preprocedure Evaluation (Signed)
 Anesthesia Evaluation  Patient identified by MRN, date of birth, ID band Patient awake    Reviewed: Allergy & Precautions, NPO status , Patient's Chart, lab work & pertinent test results  Airway Mallampati: II  TM Distance: >3 FB Neck ROM: Full    Dental no notable dental hx. (+) Teeth Intact, Dental Advisory Given, Caps   Pulmonary  Hx/o lingual tonsil Ca S/P RT and ChemoRx   Pulmonary exam normal breath sounds clear to auscultation       Cardiovascular negative cardio ROS Normal cardiovascular exam Rhythm:Regular Rate:Normal     Neuro/Psych negative neurological ROS  negative psych ROS   GI/Hepatic negative GI ROS, Neg liver ROS,,,  Endo/Other  Gout  Renal/GU negative Renal ROS   BPH    Musculoskeletal  (+) Arthritis , Osteoarthritis,  Right distal tibia symptomatic hardware S/P ORIF   Abdominal   Peds  Hematology negative hematology ROS (+)   Anesthesia Other Findings   Reproductive/Obstetrics                              Anesthesia Physical Anesthesia Plan  ASA: 2  Anesthesia Plan: General   Post-op Pain Management: Minimal or no pain anticipated   Induction: Intravenous  PONV Risk Score and Plan: 3 and Treatment may vary due to age or medical condition, TIVA, Propofol  infusion, Ondansetron  and Dexamethasone   Airway Management Planned: LMA  Additional Equipment: None  Intra-op Plan:   Post-operative Plan: Extubation in OR  Informed Consent: I have reviewed the patients History and Physical, chart, labs and discussed the procedure including the risks, benefits and alternatives for the proposed anesthesia with the patient or authorized representative who has indicated his/her understanding and acceptance.     Dental advisory given  Plan Discussed with: CRNA and Anesthesiologist  Anesthesia Plan Comments:          Anesthesia Quick Evaluation

## 2023-12-27 ENCOUNTER — Ambulatory Visit (HOSPITAL_BASED_OUTPATIENT_CLINIC_OR_DEPARTMENT_OTHER): Admitting: Anesthesiology

## 2023-12-27 ENCOUNTER — Ambulatory Visit (HOSPITAL_BASED_OUTPATIENT_CLINIC_OR_DEPARTMENT_OTHER)
Admission: RE | Admit: 2023-12-27 | Discharge: 2023-12-27 | Disposition: A | Discharge status: Home / Self Care | Attending: Orthopaedic Surgery | Admitting: Orthopaedic Surgery

## 2023-12-27 ENCOUNTER — Ambulatory Visit (HOSPITAL_BASED_OUTPATIENT_CLINIC_OR_DEPARTMENT_OTHER)

## 2023-12-27 ENCOUNTER — Encounter (HOSPITAL_BASED_OUTPATIENT_CLINIC_OR_DEPARTMENT_OTHER)
Admission: RE | Disposition: A | Discharge status: Home / Self Care | Payer: Self-pay | Source: Home / Self Care | Attending: Orthopaedic Surgery

## 2023-12-27 ENCOUNTER — Other Ambulatory Visit: Payer: Self-pay

## 2023-12-27 ENCOUNTER — Encounter (HOSPITAL_BASED_OUTPATIENT_CLINIC_OR_DEPARTMENT_OTHER): Payer: Self-pay | Admitting: Orthopaedic Surgery

## 2023-12-27 DIAGNOSIS — T8484XA Pain due to internal orthopedic prosthetic devices, implants and grafts, initial encounter: Secondary | ICD-10-CM | POA: Diagnosis not present

## 2023-12-27 DIAGNOSIS — Z85818 Personal history of malignant neoplasm of other sites of lip, oral cavity, and pharynx: Secondary | ICD-10-CM | POA: Diagnosis not present

## 2023-12-27 DIAGNOSIS — M79604 Pain in right leg: Secondary | ICD-10-CM | POA: Diagnosis not present

## 2023-12-27 DIAGNOSIS — Z472 Encounter for removal of internal fixation device: Secondary | ICD-10-CM

## 2023-12-27 DIAGNOSIS — Z9221 Personal history of antineoplastic chemotherapy: Secondary | ICD-10-CM | POA: Diagnosis not present

## 2023-12-27 DIAGNOSIS — Z923 Personal history of irradiation: Secondary | ICD-10-CM | POA: Insufficient documentation

## 2023-12-27 DIAGNOSIS — M109 Gout, unspecified: Secondary | ICD-10-CM | POA: Insufficient documentation

## 2023-12-27 DIAGNOSIS — Z969 Presence of functional implant, unspecified: Secondary | ICD-10-CM

## 2023-12-27 DIAGNOSIS — M1990 Unspecified osteoarthritis, unspecified site: Secondary | ICD-10-CM | POA: Diagnosis not present

## 2023-12-27 DIAGNOSIS — Z4589 Encounter for adjustment and management of other implanted devices: Secondary | ICD-10-CM | POA: Diagnosis present

## 2023-12-27 SURGERY — REMOVAL, HARDWARE
Anesthesia: General | Laterality: Right

## 2023-12-27 MED ORDER — DEXMEDETOMIDINE HCL IN NACL 80 MCG/20ML IV SOLN
INTRAVENOUS | Status: DC | PRN
  Administered 2023-12-27 (×3): 4 ug via INTRAVENOUS

## 2023-12-27 MED ORDER — IBUPROFEN 800 MG PO TABS
800.0000 mg | ORAL_TABLET | Freq: Three times a day (TID) | ORAL | 0 refills | Status: AC
Start: 2023-12-27 — End: 2024-01-06

## 2023-12-27 MED ORDER — PROPOFOL 10 MG/ML IV BOLUS
INTRAVENOUS | Status: AC
Start: 2023-12-27 — End: 2023-12-27
  Filled 2023-12-27: qty 20

## 2023-12-27 MED ORDER — LACTATED RINGERS IV SOLN
INTRAVENOUS | Status: DC
Start: 2023-12-27 — End: 2023-12-27

## 2023-12-27 MED ORDER — FENTANYL CITRATE (PF) 100 MCG/2ML IJ SOLN
INTRAMUSCULAR | Status: AC
  Filled 2023-12-27: qty 2

## 2023-12-27 MED ORDER — GLYCOPYRROLATE PF 0.2 MG/ML IJ SOSY
PREFILLED_SYRINGE | INTRAMUSCULAR | Status: AC
  Filled 2023-12-27: qty 1

## 2023-12-27 MED ORDER — PROPOFOL 10 MG/ML IV BOLUS
INTRAVENOUS | Status: AC
  Filled 2023-12-27: qty 20

## 2023-12-27 MED ORDER — DROPERIDOL 2.5 MG/ML IJ SOLN: 0.6250 mg | Freq: Once | INTRAMUSCULAR | Status: DC | PRN

## 2023-12-27 MED ORDER — OXYCODONE HCL 5 MG PO TABS
ORAL_TABLET | ORAL | Status: AC
  Filled 2023-12-27: qty 1

## 2023-12-27 MED ORDER — OXYCODONE HCL 5 MG/5ML PO SOLN: 5.0000 mg | Freq: Once | ORAL | Status: AC | PRN

## 2023-12-27 MED ORDER — MIDAZOLAM HCL 2 MG/2ML IJ SOLN
INTRAMUSCULAR | Status: DC | PRN
  Administered 2023-12-27: 2 mg via INTRAVENOUS

## 2023-12-27 MED ORDER — FENTANYL CITRATE (PF) 100 MCG/2ML IJ SOLN
INTRAMUSCULAR | Status: DC | PRN
  Administered 2023-12-27 (×2): 50 ug via INTRAVENOUS

## 2023-12-27 MED ORDER — ACETAMINOPHEN 500 MG PO TABS: 500.0000 mg | ORAL_TABLET | Freq: Three times a day (TID) | ORAL | 0 refills | Status: AC

## 2023-12-27 MED ORDER — PROPOFOL 10 MG/ML IV BOLUS
INTRAVENOUS | Status: DC | PRN
  Administered 2023-12-27: 20 mg via INTRAVENOUS
  Administered 2023-12-27: 50 mg via INTRAVENOUS
  Administered 2023-12-27: 180 mg via INTRAVENOUS

## 2023-12-27 MED ORDER — EPHEDRINE SULFATE (PRESSORS) 50 MG/ML IJ SOLN
INTRAMUSCULAR | Status: DC | PRN
  Administered 2023-12-27 (×2): 10 mg via INTRAVENOUS

## 2023-12-27 MED ORDER — ONDANSETRON HCL 4 MG/2ML IJ SOLN: 4.0000 mg | Freq: Once | INTRAMUSCULAR | Status: DC | PRN

## 2023-12-27 MED ORDER — BUPIVACAINE HCL 0.5 % IJ SOLN
INTRAMUSCULAR | Status: DC | PRN
  Administered 2023-12-27: 30 mL

## 2023-12-27 MED ORDER — DEXAMETHASONE SODIUM PHOSPHATE 10 MG/ML IJ SOLN
INTRAMUSCULAR | Status: DC | PRN
  Administered 2023-12-27: 5 mg via INTRAVENOUS

## 2023-12-27 MED ORDER — EPHEDRINE 5 MG/ML INJ
INTRAVENOUS | Status: AC
  Filled 2023-12-27: qty 5

## 2023-12-27 MED ORDER — OXYCODONE HCL 5 MG PO TABS
5.0000 mg | ORAL_TABLET | Freq: Once | ORAL | Status: AC | PRN
  Administered 2023-12-27: 5 mg via ORAL

## 2023-12-27 MED ORDER — ONDANSETRON HCL 4 MG/2ML IJ SOLN
INTRAMUSCULAR | Status: DC | PRN
  Administered 2023-12-27: 4 mg via INTRAVENOUS

## 2023-12-27 MED ORDER — ONDANSETRON HCL 4 MG/2ML IJ SOLN
INTRAMUSCULAR | Status: AC
  Filled 2023-12-27: qty 2

## 2023-12-27 MED ORDER — OXYCODONE HCL 5 MG PO TABS: 5.0000 mg | ORAL_TABLET | ORAL | 0 refills | Status: DC | PRN

## 2023-12-27 MED ORDER — CEFAZOLIN SODIUM-DEXTROSE 2-3 GM-%(50ML) IV SOLR
INTRAVENOUS | Status: DC | PRN
  Administered 2023-12-27: 2 g via INTRAVENOUS

## 2023-12-27 MED ORDER — LIDOCAINE HCL (CARDIAC) PF 100 MG/5ML IV SOSY
PREFILLED_SYRINGE | INTRAVENOUS | Status: DC | PRN
  Administered 2023-12-27: 80 mg via INTRATRACHEAL

## 2023-12-27 MED ORDER — MIDAZOLAM HCL 2 MG/2ML IJ SOLN
INTRAMUSCULAR | Status: AC
  Filled 2023-12-27: qty 2

## 2023-12-27 MED ORDER — GLYCOPYRROLATE 0.2 MG/ML IJ SOLN
INTRAMUSCULAR | Status: DC | PRN
Start: 2023-12-27 — End: 2023-12-27
  Administered 2023-12-27: .1 mg via INTRAVENOUS

## 2023-12-27 MED ORDER — FENTANYL CITRATE (PF) 100 MCG/2ML IJ SOLN
25.0000 ug | INTRAMUSCULAR | Status: DC | PRN
  Administered 2023-12-27 (×3): 50 ug via INTRAVENOUS

## 2023-12-27 MED ORDER — LIDOCAINE 2% (20 MG/ML) 5 ML SYRINGE
INTRAMUSCULAR | Status: AC
  Filled 2023-12-27: qty 5

## 2023-12-27 MED ORDER — DEXAMETHASONE SODIUM PHOSPHATE 10 MG/ML IJ SOLN
INTRAMUSCULAR | Status: AC
  Filled 2023-12-27: qty 1

## 2023-12-27 MED ORDER — 0.9 % SODIUM CHLORIDE (POUR BTL) OPTIME
TOPICAL | Status: DC | PRN
  Administered 2023-12-27: 1000 mL

## 2023-12-27 MED ORDER — ARTIFICIAL TEARS OPHTHALMIC OINT
TOPICAL_OINTMENT | OPHTHALMIC | Status: DC | PRN
  Administered 2023-12-27: 1 via OPHTHALMIC

## 2023-12-27 SURGICAL SUPPLY — 36 items
BENZOIN TINCTURE PRP APPL 2/3 (GAUZE/BANDAGES/DRESSINGS) IMPLANT
BLADE SURG 15 STRL LF DISP TIS (BLADE) ×1 IMPLANT
BNDG ELASTIC 4INX 5YD STR LF (GAUZE/BANDAGES/DRESSINGS) ×1 IMPLANT
CHLORAPREP W/TINT 26 (MISCELLANEOUS) ×1 IMPLANT
CLSR STERI-STRIP ANTIMIC 1/2X4 (GAUZE/BANDAGES/DRESSINGS) IMPLANT
DRAPE EXTREMITY T 121X128X90 (DISPOSABLE) IMPLANT
DRAPE IMP U-DRAPE 54X76 (DRAPES) IMPLANT
DRAPE INCISE IOBAN 66X45 STRL (DRAPES) IMPLANT
DRAPE U-SHAPE 47X51 STRL (DRAPES) ×2 IMPLANT
DRSG TEGADERM 4X4.75 (GAUZE/BANDAGES/DRESSINGS) IMPLANT
ELECTRODE REM PT RTRN 9FT ADLT (ELECTROSURGICAL) ×1 IMPLANT
GAUZE PAD ABD 8X10 STRL (GAUZE/BANDAGES/DRESSINGS) ×1 IMPLANT
GAUZE SPONGE 4X4 12PLY STRL (GAUZE/BANDAGES/DRESSINGS) ×1 IMPLANT
GAUZE XEROFORM 1X8 LF (GAUZE/BANDAGES/DRESSINGS) ×1 IMPLANT
GLOVE BIO SURGEON STRL SZ 6 (GLOVE) ×2 IMPLANT
GLOVE BIO SURGEON STRL SZ7.5 (GLOVE) ×1 IMPLANT
GLOVE BIOGEL PI IND STRL 6.5 (GLOVE) ×1 IMPLANT
GLOVE BIOGEL PI IND STRL 8 (GLOVE) ×1 IMPLANT
GOWN STRL REUS W/ TWL LRG LVL3 (GOWN DISPOSABLE) ×2 IMPLANT
GOWN STRL REUS W/TWL XL LVL3 (GOWN DISPOSABLE) ×1 IMPLANT
PACK ARTHROSCOPY DSU (CUSTOM PROCEDURE TRAY) ×1 IMPLANT
PACK BASIN DAY SURGERY FS (CUSTOM PROCEDURE TRAY) ×1 IMPLANT
PENCIL SMOKE EVACUATOR (MISCELLANEOUS) ×1 IMPLANT
SHEET MEDIUM DRAPE 40X70 STRL (DRAPES) ×1 IMPLANT
SLEEVE SCD COMPRESS KNEE MED (STOCKING) ×1 IMPLANT
SPONGE T-LAP 18X18 ~~LOC~~+RFID (SPONGE) IMPLANT
SPONGE T-LAP 4X18 ~~LOC~~+RFID (SPONGE) ×1 IMPLANT
SUCTION TUBE FRAZIER 10FR DISP (SUCTIONS) ×1 IMPLANT
SUT ETHILON 3 0 PS 1 (SUTURE) IMPLANT
SUT MNCRL AB 3-0 PS2 27 (SUTURE) IMPLANT
SUT VIC AB 0 CT1 27XBRD ANBCTR (SUTURE) IMPLANT
SUT VIC AB 2-0 SH 27XBRD (SUTURE) IMPLANT
SYR BULB EAR ULCER 3OZ GRN STR (SYRINGE) ×1 IMPLANT
TOWEL GREEN STERILE FF (TOWEL DISPOSABLE) ×2 IMPLANT
TUBE CONNECTING 20X1/4 (TUBING) IMPLANT
YANKAUER SUCT BULB TIP NO VENT (SUCTIONS) IMPLANT

## 2023-12-27 NOTE — Transfer of Care (Signed)
 Immediate Anesthesia Transfer of Care Note  Patient: Randy Kaufman  Procedure(s) Performed: REMOVAL, HARDWARE (Right)  Patient Location: PACU  Anesthesia Type:General  Level of Consciousness: awake, drowsy, and patient cooperative  Airway & Oxygen Therapy: Patient Spontanous Breathing and Patient connected to face mask oxygen  Post-op Assessment: Report given to RN, Post -op Vital signs reviewed and stable, Patient moving all extremities X 4, and Patient able to stick tongue midline  Post vital signs: Reviewed and stable  Last Vitals:  Vitals Value Taken Time  BP 116/67 12/27/23 14:25  Temp    Pulse 61 12/27/23 14:27  Resp 19 12/27/23 14:27  SpO2 100 % 12/27/23 14:27  Vitals shown include unfiled device data.  Last Pain:  Vitals:   12/27/23 1129  TempSrc: Temporal  PainSc: 0-No pain      Patients Stated Pain Goal: 4 (12/27/23 1129)  Complications: No notable events documented.

## 2023-12-27 NOTE — Discharge Instructions (Addendum)
 Discharge Instructions    Attending Surgeon: Elspeth Parker, MD Office Phone Number: [phone]   Diagnosis and Procedures:    Surgeries Performed: Removal of deep hardware right tibia  Discharge Plan:    Diet: Resume usual diet. Begin with light or bland foods.  Drink plenty of fluids.  Activity:  Weightbearing as tolerated right leg. You are advised to go home directly from the hospital or surgical center. Restrict your activities.  GENERAL INSTRUCTIONS: 1.  Please apply ice to your wound to help with swelling and inflammation. This will improve your comfort and your overall recovery following surgery.     2. Please call Dr. Danetta office at [phone] with questions Monday-Friday during business hours. If no one answers, please leave a message and someone should get back to the patient within 24 hours. For emergencies please call 911 or proceed to the emergency room.   3. Patient to notify surgical team if experiences any of the following: Bowel/Bladder dysfunction, uncontrolled pain, nerve/muscle weakness, incision with increased drainage or redness, nausea/vomiting and Fever greater than 101.0 F.  Be alert for signs of infection including redness, streaking, odor, fever or chills. Be alert for excessive pain or bleeding and notify your surgeon immediately.  WOUND INSTRUCTIONS:   Leave your dressing, cast, or splint in place until your post operative visit.  Keep it clean and dry.  Always keep the incision clean and dry until the staples/sutures are removed. If there is no drainage from the incision you should keep it open to air. If there is drainage from the incision you must keep it covered at all times until the drainage stops  Do not soak in a bath tub, hot tub, pool, lake or other body of water until 21 days after your surgery and your incision is completely dry and healed.  If you have removable sutures (or staples) they must be removed 10-14 days (unless  otherwise instructed) from the day of your surgery.     1)  Elevate the extremity as much as possible.  2)  Keep the dressing clean and dry.  3)  Please call us  if the dressing becomes wet or dirty.  4)  If you are experiencing worsening pain or worsening swelling, please call.     MEDICATIONS: Resume all previous home medications at the previous prescribed dose and frequency unless otherwise noted Start taking the  pain medications on an as-needed basis as prescribed  Please taper down pain medication over the next week following surgery.  Ideally you should not require a refill of any narcotic pain medication.  Take pain medication with food to minimize nausea. In addition to the prescribed pain medication, you may take over-the-counter pain relievers such as Tylenol .  Do NOT take additional tylenol  if your pain medication already has tylenol  in it.  Aspirin 325mg  daily per instructions on bottle. Narcotic policy: Per [facility] clinic policy, our goal is ensure optimal postoperative pain control with a multimodal pain management strategy. For all [facility] patients, our goal is to wean post-operative narcotic medications by 6 weeks post-operatively, and many times sooner. If this is not possible due to utilization of pain medication prior to surgery, your [facility] doctor will support your acute post-operative pain control for the first 6 weeks postoperatively, with a plan to transition you back to your primary pain team following that. Maralee will work to ensure a Therapist, occupational.       FOLLOWUP INSTRUCTIONS: 1. Follow up at the Physical  Therapy Clinic 3-4 days following surgery. This appointment should be scheduled unless other arrangements have been made.The Physical Therapy scheduling number is [phone] if an appointment has not already been arranged.  2. Contact Dr. Danetta office during office hours at [phone] or the practice after hours line at [phone] for  non-emergencies. For medical emergencies call 911.   Discharge Location: Home  Post Anesthesia Home Care Instructions  Activity: Get plenty of rest for the remainder of the day. A responsible individual must stay with you for 24 hours following the procedure.  For the next 24 hours, DO NOT: -Drive a car -Advertising copywriter -Drink alcoholic beverages -Take any medication unless instructed by your physician -Make any legal decisions or sign important papers.  Meals: Start with liquid foods such as gelatin or soup. Progress to regular foods as tolerated. Avoid greasy, spicy, heavy foods. If nausea and/or vomiting occur, drink only clear liquids until the nausea and/or vomiting subsides. Call your physician if vomiting continues.  Special Instructions/Symptoms: Your throat may feel dry or sore from the anesthesia or the breathing tube placed in your throat during surgery. If this causes discomfort, gargle with warm salt water. The discomfort should disappear within 24 hours.  If you had a scopolamine patch placed behind your ear for the management of post- operative nausea and/or vomiting:  1. The medication in the patch is effective for 72 hours, after which it should be removed.  Wrap patch in a tissue and discard in the trash. Wash hands thoroughly with soap and water. 2. You may remove the patch earlier than 72 hours if you experience unpleasant side effects which may include dry mouth, dizziness or visual disturbances. 3. Avoid touching the patch. Wash your hands with soap and water after contact with the patch.

## 2023-12-27 NOTE — Op Note (Signed)
   Date of Surgery: 12/27/2023  INDICATIONS: Mr. Staat is a 63 y.o.-year-old male with right symptomatic tibia hardware.  The risk and benefits of the procedure were discussed in detail and documented in the pre-operative evaluation.   PREOPERATIVE DIAGNOSIS: 1. Right tibia symptomatic hardware  POSTOPERATIVE DIAGNOSIS: Same.  PROCEDURE: 1. Right tibia removal of deep hardware/plate  SURGEON: Elspeth LITTIE Parker MD  ASSISTANT: Conley Dawson, ATC  ANESTHESIA:  general  IV FLUIDS AND URINE: See anesthesia record.  ANTIBIOTICS: Ancef   ESTIMATED BLOOD LOSS: 10 mL.  IMPLANTS:  * No implants in log *  DRAINS: None  CULTURES: None  COMPLICATIONS: none  DESCRIPTION OF PROCEDURE:   Patient was identified in the preoperative holding area.  The correct site was marked according to universal protocol.  He was subsequently taken back to the operating room.  Prepped and draped in the usual sterile fashion.  Anesthesia was induced.  Final timeout was again performed.  15 blade was used to incise through skin over the previous incision.  Electrocautery was used to release the fascia off of the tibial crest of the anterior tibial compartment.  The plate was identified and the overlying tissue was removed with a key elevator.  The proximal and distal screws were then removed.  These all came out easily.  At this time osteotomes were then used in sequence underneath the plate to remove this.  Fluoroscopy confirmed removal.  There were several deep screws and the decision was made not to remove these as these were thoroughly entrenched in bone.  The wound was thoroughly irrigated and closed in layers of 0 Vicryl 2-0 Vicryl and 3-0 nylon.  He was taken to the PACU without complication.  All counts were correct at the end of the case    POSTOPERATIVE PLAN: He will be weightbearing as tolerated on the right leg.  I will see him back in 2 weeks for wound check  Elspeth LITTIE Parker, MD 2:16 PM

## 2023-12-27 NOTE — Anesthesia Procedure Notes (Signed)
 Procedure Name: LMA Insertion Date/Time: 12/27/2023 1:11 PM  Performed by: Edith Elsie Lloyd, CRNAPre-anesthesia Checklist: Patient identified, Emergency Drugs available, Suction available and Patient being monitored Patient Re-evaluated:Patient Re-evaluated prior to induction Oxygen Delivery Method: Circle system utilized Preoxygenation: Pre-oxygenation with 100% oxygen Induction Type: IV induction LMA: LMA inserted LMA Size: 4.0 Number of attempts: 1 Placement Confirmation: positive ETCO2 and breath sounds checked- equal and bilateral Tube secured with: Tape Dental Injury: Teeth and Oropharynx as per pre-operative assessment

## 2023-12-27 NOTE — H&P (Signed)
 Office Visit Note              Patient: Randy Kaufman                                 Date of Birth: 30-Jan-1961                                                     MRN: [id number] Visit Date: 11/04/2023                                                                     Requested by: [name], MD [address] [place],  [place] [address] PCP: [name], MD      Chief Complaint  Patient presents with   Right Shoulder - Follow-up          HPI: Patient is a pleasant 63 year old gentleman follows up on his right shoulder he says he is doing much better no constant pain can sleep a little he is able to benchpress doing a little dumbbell curl now has some pain.  He has good range of motion.  He is not taking any medication.  He is here however also to look at his right leg.  He has a history of a ORIF of his right tib-fib.  He feels like the hardware is protruding.   Assessment & Plan: Visit Diagnoses:  1. Right leg pain       Plan: With regards to his shoulder he is doing quite well.  He does still have a little pain but he has been working on exercise and he did do well from the injection.  With regards to his right lower leg I did review the x-rays he does have fracture of the screws at the distal end of the tibial plate x 4.  It has become more prominent and he sometimes gets scabbing.  Discussed with him possible removal of hardware which she is requesting.  Reviewed the risks of the surgery including bleeding infection anesthesia complications need for future surgery.  Reviewed the recovery from this.  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  He understands he would have to have a clearance both from his primary care doctor and his oncologist that he is healthy to go through the surgery we will contact him with regards to scheduling   Follow-Up Instructions: To schedule surgery   Ortho Exam   Patient is alert, oriented, no adenopathy,  well-dressed, normal affect, normal respiratory effort. Examination of his right shoulder full range of motion better strength less pain neurovascular intact. Right lower leg will well-healed surgical incision.  Pulses are intact he has good dorsiflexion plantarflexion compartments are soft and compressible he does have prominence with some skin tenting at the distal end of his plate.       Imaging: No results found. No images are attached to the encounter.   Labs: Recent Labs       Lab Results  Component Value  Date    LABURIC 6.0 05/26/2023    REPTSTATUS 06/16/2008 FINAL 06/15/2008    CULT NO GROWTH 06/15/2008          Recent Labs       Lab Results  Component Value Date    ALBUMIN 4.1 10/13/2023    ALBUMIN 4.0 07/15/2023    ALBUMIN 3.3 (L) 07/01/2023        Recent Labs       Lab Results  Component Value Date    MG 1.8 10/13/2023    MG 2.1 08/12/2023    MG 2.1 07/15/2023      Recent Labs  No results found for: VD25OH     Recent Labs  No results found for: PREALBUMIN       Latest Ref Rng & Units 10/13/2023   11:27 AM 08/12/2023   11:26 AM 07/15/2023   11:30 AM  CBC EXTENDED  WBC 4.0 - 10.5 K/uL 3.4  5.0  5.8   RBC 4.22 - 5.81 MIL/uL 3.43  3.02  3.06   Hemoglobin 13.0 - 17.0 g/dL 88.5  89.9  9.8   HCT 60.9 - 52.0 % 32.4  28.9  27.9   Platelets 150 - 400 K/uL 191  267  153   NEUT# 1.7 - 7.7 K/uL 2.5  4.4  5.3   Lymph# 0.7 - 4.0 K/uL 0.2  0.2  0.1         There is no height or weight on file to calculate BMI.   Orders:     Orders Placed This Encounter  Procedures   DG Tibia/Fibula Right    No orders of the defined types were placed in this encounter.      Procedures: No procedures performed   Clinical Data: No additional findings.   ROS:   All other systems negative, except as noted in the HPI. Review of Systems   Objective: Vital Signs: There were no vitals taken for this visit.   Specialty Comments:  No specialty comments  available.   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 No pertinent family history.           Past Surgical History:  Procedure Laterality Date   CHOLECYSTECTOMY       HERNIA REPAIR       IR GASTROSTOMY TUBE MOD SED   05/07/2023   IR GASTROSTOMY TUBE REMOVAL   08/20/2023   IR IMAGING GUIDED PORT INSERTION   05/07/2023   IR NASO G TUBE PLC W/FL W/RAD   05/07/2023        Social History        Occupational History   Not on file  Tobacco Use   Smoking status: Never   Smokeless tobacco: Not on file  Substance and Sexual Activity  Alcohol use: No   Drug use: No   Sexual activity: Not on file

## 2023-12-27 NOTE — Interval H&P Note (Signed)
 History and Physical Interval Note:  12/27/2023 12:12 PM  Randy Kaufman  has presented today for surgery, with the diagnosis of RIGHT DISTAL TIBIA SYMPTOMATIC HARDWARE.  The various methods of treatment have been discussed with the patient and family. After consideration of risks, benefits and other options for treatment, the patient has consented to  Procedure(s) with comments: REMOVAL, HARDWARE (Right) - RIGHT DISTAL TIBIA REMOVAL OF HARDWARE as a surgical intervention.  The patient's history has been reviewed, patient examined, no change in status, stable for surgery.  I have reviewed the patient's chart and labs.  Questions were answered to the patient's satisfaction.     [name]

## 2023-12-27 NOTE — Anesthesia Postprocedure Evaluation (Signed)
 Anesthesia Post Note  Patient: Randy Kaufman  Procedure(s) Performed: REMOVAL, HARDWARE (Right)     Patient location during evaluation: PACU Anesthesia Type: General Level of consciousness: awake and alert and oriented Pain management: pain level controlled Vital Signs Assessment: post-procedure vital signs reviewed and stable Respiratory status: spontaneous breathing, nonlabored ventilation and respiratory function stable Cardiovascular status: blood pressure returned to baseline and stable Postop Assessment: no apparent nausea or vomiting Anesthetic complications: no   No notable events documented.  Last Vitals:  Vitals:   12/27/23 1457 12/27/23 1503  BP: 123/72 118/75  Pulse: 67 (!) 55  Resp: 20 16  Temp:  36.8 C  SpO2: 97% 98%    Last Pain:  Vitals:   12/27/23 1507  TempSrc:   PainSc: 6                  Zian Delair A.

## 2023-12-27 NOTE — Brief Op Note (Signed)
   Brief Op Note  Date of Surgery: 12/27/2023  Preoperative Diagnosis: RIGHT DISTAL TIBIA SYMPTOMATIC HARDWARE  Postoperative Diagnosis: same  Procedure: Procedure(s): REMOVAL, HARDWARE  Implants: * No implants in log *  Surgeons: Surgeon(s): Genelle Standing, MD  Anesthesia: General    Estimated Blood Loss: See anesthesia record  Complications: None  Condition to PACU: Stable  Standing LITTIE Genelle, MD 12/27/2023 2:16 PM

## 2023-12-28 ENCOUNTER — Telehealth (HOSPITAL_BASED_OUTPATIENT_CLINIC_OR_DEPARTMENT_OTHER): Payer: Self-pay

## 2023-12-28 ENCOUNTER — Encounter (HOSPITAL_BASED_OUTPATIENT_CLINIC_OR_DEPARTMENT_OTHER): Payer: Self-pay | Admitting: Orthopaedic Surgery

## 2023-12-28 NOTE — Telephone Encounter (Signed)
 Pt called and stated he got his bandages wet and wanted to have it rewrapped. I advised pt to come to the office today and I would re-wrap it.

## 2024-01-03 ENCOUNTER — Telehealth: Payer: Self-pay | Admitting: Orthopaedic Surgery

## 2024-01-03 NOTE — Telephone Encounter (Signed)
 Patient called and said that he needs a morning appointment and if you could fit him in. He is a post op. RA#[phone]

## 2024-01-05 ENCOUNTER — Ambulatory Visit (INDEPENDENT_AMBULATORY_CARE_PROVIDER_SITE_OTHER): Admitting: Orthopaedic Surgery

## 2024-01-05 DIAGNOSIS — M25511 Pain in right shoulder: Secondary | ICD-10-CM

## 2024-01-05 NOTE — Progress Notes (Signed)
 Post Operative Evaluation    Procedure/Date of Surgery: Right tibia removal of hardware 9/22  Interval History:   Presents 2 weeks status post tibia removal of hardware.  He is occasionally experiencing some swelling although this is overall mild   PMH/PSH/Family History/Social History/Meds/Allergies:    Past Medical History:  Diagnosis Date   Cancer (HCC)    neck and throat   Past Surgical History:  Procedure Laterality Date   CHOLECYSTECTOMY     HARDWARE REMOVAL Right 12/27/2023   Procedure: REMOVAL, HARDWARE;  Surgeon: Genelle Standing, MD;  Location: [facility];  Service: Orthopedics;  Laterality: Right;  RIGHT DISTAL TIBIA REMOVAL OF HARDWARE   HERNIA REPAIR     IR GASTROSTOMY TUBE MOD SED  05/07/2023   IR GASTROSTOMY TUBE REMOVAL  08/20/2023   IR IMAGING GUIDED PORT INSERTION  05/07/2023   IR NASO G TUBE PLC W/FL W/RAD  05/07/2023   IR REMOVAL TUN ACCESS W/ PORT W/O FL MOD SED  12/10/2023   Social History   Socioeconomic History   Marital status: Married    Spouse name: Not on file   Number of children: Not on file   Years of education: Not on file   Highest education level: Not on file  Occupational History   Not on file  Tobacco Use   Smoking status: Never   Smokeless tobacco: Not on file  Substance and Sexual Activity   Alcohol use: No   Drug use: No   Sexual activity: Not on file  Other Topics Concern   Not on file  Social History Narrative   Not on file   Social Drivers of Health   Financial Resource Strain: Not on file  Food Insecurity: No Food Insecurity (04/20/2023)   Hunger Vital Sign    Worried About Running Out of Food in the Last Year: Never true    Ran Out of Food in the Last Year: Never true  Transportation Needs: No Transportation Needs (04/20/2023)   PRAPARE - Administrator, Civil Service (Medical): No    Lack of Transportation (Non-Medical): No  Physical Activity: Not on file   Stress: Not on file  Social Connections: Not on file   No family history on file. Allergies  Allergen Reactions   Sulfa  Antibiotics Rash   Current Outpatient Medications  Medication Sig Dispense Refill   acetaminophen  (TYLENOL ) 500 MG tablet Take 1 tablet (500 mg total) by mouth every 8 (eight) hours for 10 days. 30 tablet 0   ascorbic acid (VITAMIN C) 1000 MG tablet Take 1,000 mg by mouth daily.     finasteride (PROSCAR) 5 MG tablet Take 5 mg by mouth daily. Takes 1/2 Tab to equal 2.5 mg daily     ibuprofen  (ADVIL ) 800 MG tablet Take 1 tablet (800 mg total) by mouth every 8 (eight) hours for 10 days. Please take with food, please alternate with acetaminophen  30 tablet 0   Multiple Vitamin (MULTI-VITAMIN) tablet Take 1 tablet by mouth daily.     oxyCODONE  (ROXICODONE ) 5 MG immediate release tablet Take 1 tablet (5 mg total) by mouth every 4 (four) hours as needed for severe pain (pain score 7-10) or breakthrough pain. 10 tablet 0   TRUE MAGNESIUM  OXIDE 400 MG tablet TAKE 1 TABLET BY MOUTH EVERY DAY 90 tablet  1   No current facility-administered medications for this visit.   No results found.  Review of Systems:   A ROS was performed including pertinent positives and negatives as documented in the HPI.   Musculoskeletal Exam:    There were no vitals taken for this visit.  Right knee lateral incision is well-appearing without erythema or drainage.  Distal neurosensory exams intact.  Mild bruising.  Imaging:      I personally reviewed and interpreted the radiographs.   Assessment:   2 weeks status post right knee/tibia removal of hardware doing well.  At this time he will be activity weightbearing as tolerated.  I will plan to see him back as needed  Plan :    - Return to clinic as needed      I personally saw and evaluated the patient, and participated in the management and treatment plan.  Elspeth Parker, MD Attending Physician, Orthopedic Surgery  This  document was dictated using Dragon voice recognition software. A reasonable attempt at proof reading has been made to minimize errors.

## 2024-01-07 ENCOUNTER — Encounter (HOSPITAL_BASED_OUTPATIENT_CLINIC_OR_DEPARTMENT_OTHER): Admitting: Orthopaedic Surgery

## 2024-02-01 ENCOUNTER — Other Ambulatory Visit: Payer: Self-pay | Admitting: Oncology

## 2024-02-01 DIAGNOSIS — C024 Malignant neoplasm of lingual tonsil: Secondary | ICD-10-CM

## 2024-02-02 ENCOUNTER — Telehealth: Payer: Self-pay | Admitting: Oncology

## 2024-02-02 ENCOUNTER — Inpatient Hospital Stay

## 2024-02-02 ENCOUNTER — Inpatient Hospital Stay: Admitting: Oncology

## 2024-02-02 NOTE — Telephone Encounter (Signed)
 Patient called in a few minutes before his appointment to reschedule for 02/10/2024. Patient is aware of date and time change.

## 2024-02-07 ENCOUNTER — Encounter: Payer: Self-pay | Admitting: Radiology

## 2024-02-10 ENCOUNTER — Inpatient Hospital Stay: Attending: Oncology

## 2024-02-10 ENCOUNTER — Inpatient Hospital Stay: Admitting: Oncology

## 2024-02-10 VITALS — BP 128/76 | HR 60 | Temp 98.4°F | Resp 17 | Ht 72.0 in | Wt 166.0 lb

## 2024-02-10 DIAGNOSIS — C024 Malignant neoplasm of lingual tonsil: Secondary | ICD-10-CM

## 2024-02-10 DIAGNOSIS — R634 Abnormal weight loss: Secondary | ICD-10-CM | POA: Insufficient documentation

## 2024-02-10 DIAGNOSIS — I700 Atherosclerosis of aorta: Secondary | ICD-10-CM | POA: Insufficient documentation

## 2024-02-10 DIAGNOSIS — I2510 Atherosclerotic heart disease of native coronary artery without angina pectoris: Secondary | ICD-10-CM | POA: Insufficient documentation

## 2024-02-10 DIAGNOSIS — R918 Other nonspecific abnormal finding of lung field: Secondary | ICD-10-CM | POA: Diagnosis not present

## 2024-02-10 DIAGNOSIS — N200 Calculus of kidney: Secondary | ICD-10-CM | POA: Insufficient documentation

## 2024-02-10 DIAGNOSIS — Z79899 Other long term (current) drug therapy: Secondary | ICD-10-CM | POA: Diagnosis not present

## 2024-02-10 DIAGNOSIS — K5730 Diverticulosis of large intestine without perforation or abscess without bleeding: Secondary | ICD-10-CM | POA: Diagnosis not present

## 2024-02-10 DIAGNOSIS — Z923 Personal history of irradiation: Secondary | ICD-10-CM | POA: Insufficient documentation

## 2024-02-10 DIAGNOSIS — R6881 Early satiety: Secondary | ICD-10-CM | POA: Insufficient documentation

## 2024-02-10 DIAGNOSIS — T451X5A Adverse effect of antineoplastic and immunosuppressive drugs, initial encounter: Secondary | ICD-10-CM

## 2024-02-10 DIAGNOSIS — M79659 Pain in unspecified thigh: Secondary | ICD-10-CM | POA: Insufficient documentation

## 2024-02-10 DIAGNOSIS — D701 Agranulocytosis secondary to cancer chemotherapy: Secondary | ICD-10-CM

## 2024-02-10 DIAGNOSIS — Z882 Allergy status to sulfonamides status: Secondary | ICD-10-CM | POA: Insufficient documentation

## 2024-02-10 DIAGNOSIS — M129 Arthropathy, unspecified: Secondary | ICD-10-CM | POA: Diagnosis not present

## 2024-02-10 DIAGNOSIS — J984 Other disorders of lung: Secondary | ICD-10-CM | POA: Insufficient documentation

## 2024-02-10 LAB — CMP (CANCER CENTER ONLY)
ALT: 18 U/L (ref 0–44)
AST: 23 U/L (ref 15–41)
Albumin: 4.4 g/dL (ref 3.5–5.0)
Alkaline Phosphatase: 68 U/L (ref 38–126)
Anion gap: 6 (ref 5–15)
BUN: 15 mg/dL (ref 8–23)
CO2: 28 mmol/L (ref 22–32)
Calcium: 9.8 mg/dL (ref 8.9–10.3)
Chloride: 104 mmol/L (ref 98–111)
Creatinine: 1.1 mg/dL (ref 0.61–1.24)
GFR, Estimated: >60 mL/min (ref >60)
Glucose, Bld: 91 mg/dL (ref 70–99)
Potassium: 4.7 mmol/L (ref 3.5–5.1)
Sodium: 138 mmol/L (ref 135–145)
Total Bilirubin: 0.6 mg/dL (ref 0.0–1.2)
Total Protein: 7.4 g/dL (ref 6.5–8.1)

## 2024-02-10 LAB — CBC WITH DIFFERENTIAL (CANCER CENTER ONLY)
Abs Immature Granulocytes: 0.01 K/uL (ref 0.00–0.07)
Basophils Absolute: 0.1 K/uL (ref 0.0–0.1)
Basophils Relative: 2 %
Eosinophils Absolute: 0.2 K/uL (ref 0.0–0.5)
Eosinophils Relative: 6 %
HCT: 35 % — ABNORMAL LOW (ref 39.0–52.0)
Hemoglobin: 12 g/dL — ABNORMAL LOW (ref 13.0–17.0)
Immature Granulocytes: 0 %
Lymphocytes Relative: 17 %
Lymphs Abs: 0.5 K/uL — ABNORMAL LOW (ref 0.7–4.0)
MCH: 32 pg (ref 26.0–34.0)
MCHC: 34.3 g/dL (ref 30.0–36.0)
MCV: 93.3 fL (ref 80.0–100.0)
Monocytes Absolute: 0.6 K/uL (ref 0.1–1.0)
Monocytes Relative: 22 %
Neutro Abs: 1.4 K/uL — ABNORMAL LOW (ref 1.7–7.7)
Neutrophils Relative %: 53 %
Platelet Count: 194 K/uL (ref 150–400)
RBC: 3.75 MIL/uL — ABNORMAL LOW (ref 4.22–5.81)
RDW: 12 % (ref 11.5–15.5)
WBC Count: 2.7 K/uL — ABNORMAL LOW (ref 4.0–10.5)
nRBC: 0 % (ref 0.0–0.2)

## 2024-02-10 LAB — TSH: TSH: 1.42 u[IU]/mL (ref 0.350–4.500)

## 2024-02-10 LAB — MAGNESIUM: Magnesium: 2.1 mg/dL (ref 1.7–2.4)

## 2024-02-10 NOTE — Progress Notes (Signed)
 [facility]  ONCOLOGY CLINIC PROGRESS NOTE   Patient Care Team: [name], MD as PCP - General (Family Medicine) [name], MD as Attending Physician (Radiation Oncology) [name], MD as Consulting Physician (Otolaryngology) Malmfelt, Delon CROME, RN as Oncology Nurse Navigator [name], MD as Consulting Physician (Oncology)  PATIENT NAME: Randy Kaufman   MR#: [id number] DOB: 1960-07-31  Date of visit: 02/10/2024   ASSESSMENT & PLAN:   Randy Kaufman is a 63 y.o. 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 Unknown p16 status.  Treated with concurrent chemoradiation with weekly cisplatin .  Lingual tonsil carcinoma (HCC) Please review oncology history for additional details and timeline of events.  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 Unknown p16 status.  Clinically cT2,cN1,cM0.  There is concern for extracapsular extension based on imaging.  Discussed his case in tumor conference on 04/21/2023. Given the size and nature of the lymph node with concern for extracapsular extension, he has a high likelihood of requiring adjuvant chemoradiation if removed surgically.  Hence consensus opinion is to proceed with concurrent chemoradiation.  It was decided to not pursue another biopsy in an attempt to determine p16 status, since the treatment plan would not change.  He started radiation from 05/10/2023.  Started chemotherapy with cisplatin  from 05/14/2023 and completed dose #7 on 06/25/2023.  Because of cytopenias, we started dose reducing cisplatin  to 30 mg/m starting from cycle 5 onwards.  He has recovered well from chemoradiation related side effects.  Continues to have lingering  leukopenia though.  On 09/27/2023, restaging PET scan showed substantially decreased activity in the right palatine tonsil and left palatine tonsil, compatible with treatment response. Improved conglomerate right level IIA adenopathy, index node currently 1.4 cm in short axis with maximum SUV 3.4, previously 2.6 cm in short axis with maximum SUV 11.6. Mildly accentuated activity along the tongue base, maximum SUV 4.6. This could be physiologic but is technically nonspecific. Ground-glass density nodularity in the right upper lobe anteriorly measuring 1.1 by 0.6 cm with maximum SUV 3.2. This was not present 12/16/2023 and is most likely inflammatory based on morphology and previous absence, surveillance suggested. Nonobstructive right nephrolithiasis. Coronary artery disease with focal calcification in the left anterior descending coronary artery.  Clinically he is in remission.  Faint brightness in throat and smaller lymph node is most likely scar tissue. No masses detected.   He will follow-up with ENT (Dr. Jesus) on 02/24/2024.  Radiation oncology department has requested CT of the neck and chest in about 6 months from the last scan.  Going forward we will continue surveillance as per NCCN guidelines.   RTC in 4 months for follow-up with repeat labs.  Leukopenia due to antineoplastic chemotherapy White count has been fluctuating and it is slightly low today at 3400.  ANC 2500.  Hemoglobin slowly improving and it is 11.4 today.  Will continue to monitor.  Unintentional weight loss after cancer treatment Significant weight loss since March, with current weight at 166 lbs. BMI is 22.5, which is within normal range. Weight loss attributed to decreased appetite and increased physical activity. Advised to gain 7-8 lbs to reach a healthier BMI closer to 25. - Encouraged healthy eating to gain 7-8 lbs - Continue regular physical activity  I reviewed lab  results and outside records for this visit and  discussed relevant results with the patient. Diagnosis, plan of care and treatment options were also discussed in detail with the patient. Opportunity provided to ask questions and answers provided to his apparent satisfaction. Provided instructions to call our clinic with any problems, questions or concerns prior to return visit. I recommended to continue follow-up with PCP and sub-specialists. He verbalized understanding and agreed with the plan.   NCCN guidelines have been consulted in the planning of this patient's care.  I spent a total of 30 minutes during this encounter with the patient including review of chart and various tests results, discussions about plan of care and coordination of care plan.  Chinita Patten, MD  02/10/2024 9:35 AM  [facility] - A DEPT OF [facility] [address] [place] [id number] Dept: [phone] Dept Fax: [phone]    CHIEF COMPLAINT/ REASON FOR VISIT:   Squamous cell carcinoma consistent with a right lingual tonsillar primary with a metastatic ipsilateral cervical node. Clinically cT2,cN1,cM0.   Current Treatment: Concurrent chemoradiation with weekly cisplatin  started during the week of 05/10/2023. Completed dose #7 on 06/25/2023.  Because of cytopenias, we started dose reducing cisplatin  to 30 mg/m starting from cycle 5 onwards. Completed radiation around the same time.  INTERVAL HISTORY:    Discussed the use of AI scribe software for clinical note transcription with the patient, who gave verbal consent to proceed.  History of Present Illness  Randy Kaufman is a 63 year old male who presents for follow-up regarding weight loss and low white blood cell count.  He has experienced significant weight loss over the past year, decreasing from 200 pounds in February to 166 pounds currently. This weight loss is associated with decreased appetite and early satiety. He is attempting  to maintain his weight by consuming regular meals in smaller portions. He has increased his physical activity, attending the gym four to five days a week and engaging in yard work and household chores. Despite these efforts, he frequently feels cold and uses a heater and blanket to stay warm.  His taste and ability to swallow have improved since the removal of a feeding tube, allowing him to swallow multivitamins without difficulty. He underwent a surgical procedure to remove two plates from his right leg after breaking five screws, which were at risk of breaking the skin. Post-operatively, he used oxycodone  to aid sleep but experienced minimal pain otherwise.  His white blood cell count remains low at 2,700. He has no current infections or symptoms of infection, such as fevers, chills, or night sweats. He consumes alcohol moderately, typically a glass of wine or a margarita every few days. His current medications include multivitamins, vitamin C, and magnesium .   I have reviewed the past medical history, past surgical history, social history and family history with the patient and they are unchanged from previous note.  HISTORY OF PRESENT ILLNESS:   ONCOLOGY HISTORY:   He presented to his dentist last year with c/o of a small lump to his right neck. He was subsequently referred to Dr. Jesus at [facility], on 04/02/23 for further evaluation. Physical exam performed at that time noted a palpable 4 cm right level 2 neck mass. No other masses were palpated.    A biopsy of the right neck mass was collected that date (04/02/23) and showed findings consistent with metastatic squamous cell carcinoma.  There  was insufficient tissue to check for p16 status.   Staging PET scan on 04/14/22 revealed hypermetabolic activity at the right lingual tonsil and right level II node. The enlarged lymph node appeared to measure approximately 2.6 cm in greatest dimension. No evidence of distant metastatic disease was  seen.    He later had CT soft tissue neck at [facility].   Unknown p16 status.  Clinically cT2,cN1,cM0.  There is concern for extracapsular extension based on imaging.   Discussed his case in tumor conference on 04/21/2023. Given the size and nature of the lymph node with concern for extracapsular extension, he has a high likelihood of requiring adjuvant chemoradiation if removed surgically.  Hence consensus opinion was to proceed with concurrent chemoradiation. It was decided to not pursue another biopsy in an attempt to determine p16 status, since the treatment plan would not change.  He started radiation treatments from 05/10/2023.  Started chemotherapy with cisplatin  from 05/14/2023 and completed dose #7 on 06/25/2023.  Because of cytopenias, we started dose reducing cisplatin  to 30 mg/m starting from cycle 5 onwards. Completed radiation around the same time.  On 09/27/2023, restaging PET scan showed substantially decreased activity in the right palatine tonsil and left palatine tonsil, compatible with treatment response. Improved conglomerate right level IIA adenopathy, index node currently 1.4 cm in short axis with maximum SUV 3.4, previously 2.6 cm in short axis with maximum SUV 11.6. Mildly accentuated activity along the tongue base, maximum SUV 4.6. This could be physiologic but is technically nonspecific. Ground-glass density nodularity in the right upper lobe anteriorly measuring 1.1 by 0.6 cm with maximum SUV 3.2. This was not present 12/16/2023 and is most likely inflammatory based on morphology and previous absence, surveillance suggested. Nonobstructive right nephrolithiasis. Coronary artery disease with focal calcification in the left anterior descending coronary artery.  Oncology History  Lingual tonsil carcinoma (HCC)  04/20/2023 Initial Diagnosis   Lingual tonsil carcinoma (HCC)   04/22/2023 Cancer Staging   Staging form: Pharynx - HPV-Mediated Oropharynx, AJCC 8th Edition -  Clinical: Stage I (cT2, cN1, cM0, p16: Unknown, HPV: Unknown) - Signed by [name], MD on 04/22/2023   05/14/2023 -  Chemotherapy   Patient is on Treatment Plan : HEAD/NECK Cisplatin  (40) q7d         REVIEW OF SYSTEMS:   Review of Systems - Oncology  All other pertinent systems were reviewed with the patient and are negative.  ALLERGIES: He is allergic to sulfa  antibiotics.  MEDICATIONS:  Current Outpatient Medications  Medication Sig Dispense Refill   ascorbic acid (VITAMIN C) 1000 MG tablet Take 1,000 mg by mouth daily.     finasteride (PROSCAR) 5 MG tablet Take 5 mg by mouth daily. Takes 1/2 Tab to equal 2.5 mg daily     Multiple Vitamin (MULTI-VITAMIN) tablet Take 1 tablet by mouth daily.     oxyCODONE  (ROXICODONE ) 5 MG immediate release tablet Take 1 tablet (5 mg total) by mouth every 4 (four) hours as needed for severe pain (pain score 7-10) or breakthrough pain. 10 tablet 0   TRUE MAGNESIUM  OXIDE 400 MG tablet TAKE 1 TABLET BY MOUTH EVERY DAY 90 tablet 1   No current facility-administered medications for this visit.     VITALS:   Blood pressure 128/76, pulse 60, temperature 98.4 F (36.9 C), temperature source Temporal, resp. rate 17, height 6' (1.829 m), weight 166 lb (75.3 kg), SpO2 100%.  Wt Readings from Last 3 Encounters:  02/10/24 166 lb (75.3 kg)  12/27/23 164 lb 14.5 oz (74.8 kg)  10/13/23 174 lb (78.9 kg)    Body mass index is 22.51 kg/m.      PHYSICAL EXAM:   Physical Exam Constitutional:      General: He is not in acute distress.    Appearance: Normal appearance.  HENT:     Head: Normocephalic and atraumatic.     Mouth/Throat:     Comments: No obvious abnormalities in the visualized portion of oral cavity Eyes:     General: No scleral icterus.    Conjunctiva/sclera: Conjunctivae normal.  Cardiovascular:     Rate and Rhythm: Normal rate and regular rhythm.     Pulses: Normal pulses.     Heart sounds: Normal heart sounds.  Pulmonary:      Effort: Pulmonary effort is normal.     Breath sounds: Normal breath sounds.  Abdominal:     General: There is no distension.  Lymphadenopathy:     Cervical: No cervical adenopathy.  Neurological:     General: No focal deficit present.     Mental Status: He is alert and oriented to person, place, and time.  Psychiatric:        Mood and Affect: Mood normal.        Behavior: Behavior normal.        Thought Content: Thought content normal.      LABORATORY DATA:   I have reviewed the data as listed.  Results for orders placed or performed in visit on 02/10/24  TSH  Result Value Ref Range   TSH 1.420 0.350 - 4.500 uIU/mL  Magnesium   Result Value Ref Range   Magnesium  2.1 1.7 - 2.4 mg/dL  CMP (Cancer Center only)  Result Value Ref Range   Sodium 138 135 - 145 mmol/L   Potassium 4.7 3.5 - 5.1 mmol/L   Chloride 104 98 - 111 mmol/L   CO2 28 22 - 32 mmol/L   Glucose, Bld 91 70 - 99 mg/dL   BUN 15 8 - 23 mg/dL   Creatinine 8.89 9.38 - 1.24 mg/dL   Calcium 9.8 8.9 - 89.6 mg/dL   Total Protein 7.4 6.5 - 8.1 g/dL   Albumin 4.4 3.5 - 5.0 g/dL   AST 23 15 - 41 U/L   ALT 18 0 - 44 U/L   Alkaline Phosphatase 68 38 - 126 U/L   Total Bilirubin 0.6 0.0 - 1.2 mg/dL   GFR, Estimated >39 >39 mL/min   Anion gap 6 5 - 15  CBC with Differential (Cancer Center Only)  Result Value Ref Range   WBC Count 2.7 (L) 4.0 - 10.5 K/uL   RBC 3.75 (L) 4.22 - 5.81 MIL/uL   Hemoglobin 12.0 (L) 13.0 - 17.0 g/dL   HCT 64.9 (L) 60.9 - 47.9 %   MCV 93.3 80.0 - 100.0 fL   MCH 32.0 26.0 - 34.0 pg   MCHC 34.3 30.0 - 36.0 g/dL   RDW 87.9 88.4 - 84.4 %   Platelet Count 194 150 - 400 K/uL   nRBC 0.0 0.0 - 0.2 %   Neutrophils Relative % 53 %   Neutro Abs 1.4 (L) 1.7 - 7.7 K/uL   Lymphocytes Relative 17 %   Lymphs Abs 0.5 (L) 0.7 - 4.0 K/uL   Monocytes Relative 22 %   Monocytes Absolute 0.6 0.1 - 1.0 K/uL   Eosinophils Relative 6 %   Eosinophils Absolute 0.2 0.0 - 0.5 K/uL   Basophils Relative 2 %  Basophils Absolute 0.1 0.0 - 0.1 K/uL   Immature Granulocytes 0 %   Abs Immature Granulocytes 0.01 0.00 - 0.07 K/uL       RADIOGRAPHIC STUDIES:  DG MINI C-ARM IMAGE ONLY There is no interpretation for this exam.    This order is for images obtained during a surgical procedure.  Please See  Surgeries Tab for more information regarding the procedure.  PET Scan 09/27/2023  CLINICAL DATA:  Subsequent treatment strategy for tonsillar cancer.   EXAM: NUCLEAR MEDICINE PET SKULL BASE TO THIGH   TECHNIQUE: 8.3 mCi F-18 FDG was injected intravenously. Full-ring PET imaging was performed from the skull base to thigh after the radiotracer. CT data was obtained and used for attenuation correction and anatomic localization.   Fasting blood glucose: 91 mg/dl   COMPARISON:  98/90/7974   FINDINGS: Mediastinal blood pool activity: SUV max 2.3   Liver activity: SUV max NA   NECK: Right palatine tonsillar activity substantially decreased, formerly 10.6 and currently 3.2. There is previously a left palatine tonsillar activity as well with maximum SUV 5.8, currently 2.8.   On today's exam there is mildly accentuated activity along the tongue base, maximum SUV 4.6. This could be physiologic but is technically nonspecific.   Improved conglomerate right level IIa adenopathy, index node currently 1.4 cm in short axis with maximum SUV 3.4, previously 2.6 cm in short axis with maximum SUV 11.6.   Small focus of activity in the vicinity of the right inferior constrictor, maximum SUV 4.6 and previously 5.4 in this region, probably incidental.   Incidental CT findings: None.   CHEST: No significant abnormal hypermetabolic activity in this region.   Incidental CT findings: Right Port-A-Cath tip: Cavoatrial junction.   Left anterior descending coronary artery atheromatous vascular calcification on image 96 series 4, compatible with coronary artery disease. Mild aortic arch atheromatous  vascular calcification.   Ground-glass density nodularity in the right upper lobe anteriorly on image 18 series 7 measuring 1.1 by 0.6 cm with maximum SUV 3.2. This was not present 12/16/2023 and is most likely inflammatory based on morphology and previous absence, surveillance suggested.   ABDOMEN/PELVIS: Physiologic activity in bowel.   Incidental CT findings: Nonobstructive right nephrolithiasis. Cholecystectomy. Anterior pelvic hernia mesh. Scattered diverticula of the sigmoid colon.   SKELETON: No significant abnormal hypermetabolic activity in this region.   Incidental CT findings: Degenerative right sternoclavicular arthropathy. Deformity of the left pubic rami compatible with remote healed fractures.   IMPRESSION: 1. Substantially decreased activity in the right palatine tonsil and left palatine tonsil, compatible with treatment response. 2. Improved conglomerate right level IIA adenopathy, index node currently 1.4 cm in short axis with maximum SUV 3.4, previously 2.6 cm in short axis with maximum SUV 11.6. 3. Mildly accentuated activity along the tongue base, maximum SUV 4.6. This could be physiologic but is technically nonspecific. 4. Ground-glass density nodularity in the right upper lobe anteriorly measuring 1.1 by 0.6 cm with maximum SUV 3.2. This was not present 12/16/2023 and is most likely inflammatory based on morphology and previous absence, surveillance suggested. 5. Nonobstructive right nephrolithiasis. 6. Coronary artery disease with focal calcification in the left anterior descending coronary artery. 7. Degenerative right sternoclavicular arthropathy. 8. Anterior pelvic hernia mesh. 9. Scattered diverticula of the sigmoid colon. 10. Deformity of the left pubic rami compatible with remote healed fractures. 11.  Aortic Atherosclerosis (ICD10-I70.0).     Electronically Signed   By: Ryan Salvage M.D.   On: 09/28/2023 09:21  CODE STATUS:  Code  Status History     Date Active Date Inactive Code Status Order ID Comments User Context   05/07/2023 1642 05/08/2023 0512 Full Code [id number]  Hughes Simmonds, MD [facility]   05/07/2023 1642 05/07/2023 1642 Full Code [id number]  Hughes Simmonds, MD [facility]    Questions for Most Recent Historical Code Status (Order [id number])     Question Answer   By: Consent: discussion documented in EHR            Orders Placed This Encounter  Procedures   CBC with Differential (Cancer Center Only)    Standing Status:   Future    Expected Date:   06/09/2024    Expiration Date:   09/07/2024   CMP (Cancer Center only)    Standing Status:   Future    Expected Date:   06/09/2024    Expiration Date:   09/07/2024   TSH    Standing Status:   Future    Expected Date:   06/09/2024    Expiration Date:   09/07/2024   Vitamin B12    Standing Status:   Future    Expected Date:   06/09/2024    Expiration Date:   09/07/2024   Folate    Standing Status:   Future    Expected Date:   06/09/2024    Expiration Date:   09/07/2024   Iron and Iron Binding Capacity ([facility] only)    Standing Status:   Future    Expected Date:   06/09/2024    Expiration Date:   09/07/2024   Ferritin    Standing Status:   Future    Expected Date:   06/09/2024    Expiration Date:   09/07/2024   ANA w/Reflex if Positive    Standing Status:   Future    Expected Date:   06/09/2024    Expiration Date:   09/07/2024     Future Appointments  Date Time Provider Department Center  03/07/2024  8:30 AM [facility] 1 [facility] [place]  03/13/2024  8:20 AM [name], MD [facility] None  03/22/2024  8:15 AM Genelle Standing, MD [facility] [id number]  06/09/2024  9:00 AM [facility] LAB [facility] None  06/09/2024  9:30 AM Paytyn Mesta, Chinita, MD [facility] None     This document was completed utilizing speech recognition software. Grammatical errors, random word insertions, pronoun errors, and incomplete sentences are an occasional consequence of this system due to software limitations,  ambient noise, and hardware issues. Any formal questions or concerns about the content, text or information contained within the body of this dictation should be directly addressed to the provider for clarification.

## 2024-02-10 NOTE — Assessment & Plan Note (Addendum)
 White blood cell count remains low at 2,700, likely due to previous chemotherapy. Neutrophil count is at a safe level of 1,400, reducing the risk of infection. No signs of infection such as fevers, chills, or night sweats. Platelet count has normalized. Hemoglobin is improving towards normal. Moderate alcohol consumption may affect white count recovery. - Continue to monitor white blood cell count and neutrophil levels - Limit alcohol consumption to no more than one drink per day - Will perform additional workup at next visit to investigate causes of low white count

## 2024-02-10 NOTE — Assessment & Plan Note (Addendum)
 Please review oncology history for additional details and timeline of events.  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 Unknown p16 status.  Clinically cT2,cN1,cM0.  There is concern for extracapsular extension based on imaging.  Discussed his case in tumor conference on 04/21/2023. Given the size and nature of the lymph node with concern for extracapsular extension, Randy Kaufman has a high likelihood of requiring adjuvant chemoradiation if removed surgically.  Hence consensus opinion is to proceed with concurrent chemoradiation.  It was decided to not pursue another biopsy in an attempt to determine p16 status, since the treatment plan would not change.  Randy Kaufman started radiation from 05/10/2023.  Started chemotherapy with cisplatin  from 05/14/2023 and completed dose #7 on 06/25/2023.  Because of cytopenias, we started dose reducing cisplatin  to 30 mg/m starting from cycle 5 onwards.  Randy Kaufman has recovered well from chemoradiation related side effects.  Continues to have lingering leukopenia though.  On 09/27/2023, restaging PET scan showed substantially decreased activity in the right palatine tonsil and left palatine tonsil, compatible with treatment response. Improved conglomerate right level IIA adenopathy, index node currently 1.4 cm in short axis with maximum SUV 3.4, previously 2.6 cm in short axis with maximum SUV 11.6. Mildly accentuated activity along the tongue base, maximum SUV 4.6. This could be physiologic but is technically nonspecific. Ground-glass density nodularity in the right upper lobe anteriorly measuring 1.1 by 0.6 cm with maximum SUV 3.2. This was not present 12/16/2023 and is most likely inflammatory based on morphology and previous absence, surveillance suggested. Nonobstructive right nephrolithiasis. Coronary artery disease with focal calcification in the left anterior descending coronary  artery.  Clinically Randy Kaufman is in remission.  Faint brightness in throat and smaller lymph node is most likely scar tissue. No masses detected.   Randy Kaufman will follow-up with ENT (Dr. Jesus) on 02/24/2024.  Radiation oncology department has requested CT of the neck and chest in about 6 months from the last scan.  Going forward we will continue surveillance as per NCCN guidelines.   RTC in 4 months for follow-up with repeat labs.

## 2024-02-11 ENCOUNTER — Other Ambulatory Visit: Payer: Self-pay

## 2024-02-16 ENCOUNTER — Ambulatory Visit: Admitting: Physician Assistant

## 2024-02-16 DIAGNOSIS — M25511 Pain in right shoulder: Secondary | ICD-10-CM | POA: Diagnosis not present

## 2024-02-16 NOTE — Progress Notes (Signed)
 Office Visit Note   Patient: Randy Kaufman           Date of Birth: March 11, 1961           MRN: [id number] Visit Date: 02/16/2024              Requested by: [name], MD [address] [place],  [place] [address] PCP: [name], MD  No chief complaint on file.     HPI: Patient is a pleasant 63 year old gentleman who have seen in the past for right shoulder issues in May of this year.  At that time I evaluated him gave him a subacromial injection he seemed to do better for a while.  He now is having continued pain in the right shoulder.  He has been doing a guided exercise program involving biceps strengthening tricep strengthening rotator cuff strengthening.  Unfortunately he continues to have difficulties and he feels like he is using less and less weights.  He is right-hand dominant so this is affecting activities of daily living.  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  Difficult to do with this pain.  Pain currently is most noticed with activation of his biceps in the anterior part of his shoulder  Assessment & Plan: Visit Diagnoses:  1. Right shoulder pain, unspecified chronicity     Plan: Exam today shows some symptomology with rotator cuff issues but more so I believe with proximal biceps insertion.  He does not have a Popeye deformity.  He does have good bicep strength.  He is tender in the bicipital groove.  Also has some pain with overhead motion and internal rotation behind his back which is limited.  Because he has failed conservative treatment of directed biceps triceps and rotator cuff strengthening and he has elements of both I am recommending an MRI as this is becoming worse rather than better.  I would like for him to follow-up with Dr. Genelle to review the results  Follow-Up Instructions: After MRI with Dr. Genelle Beers Exam  Patient is alert, oriented, no adenopathy, well-dressed, normal affect, normal  respiratory effort. Examination of his right shoulder he has no deformity.  He has some pain with overhead motions decreased internal rotation behind his back.  He has a negative empty can test negative speeds test.  He does have pain reproduced with resisted biceps testing.  None with triceps.  Pain is focally in the bicipital groove.  Good grip strength no Popeye deformity.    Imaging: No results found. No images are attached to the encounter.  Labs: Lab Results  Component Value Date   LABURIC 6.0 05/26/2023   REPTSTATUS 06/16/2008 FINAL 06/15/2008   CULT NO GROWTH 06/15/2008     Lab Results  Component Value Date   ALBUMIN 4.4 02/10/2024   ALBUMIN 4.1 10/13/2023   ALBUMIN 4.0 07/15/2023    Lab Results  Component Value Date   MG 2.1 02/10/2024   MG 1.8 10/13/2023   MG 2.1 08/12/2023   No results found for: VD25OH  No results found for: PREALBUMIN    Latest Ref Rng & Units 02/10/2024    9:11 AM 10/13/2023   11:27 AM 08/12/2023   11:26 AM  CBC EXTENDED  WBC 4.0 - 10.5 K/uL 2.7  3.4  5.0   RBC 4.22 - 5.81 MIL/uL 3.75  3.43  3.02   Hemoglobin 13.0 - 17.0 g/dL 87.9  88.5  89.9  HCT 39.0 - 52.0 % 35.0  32.4  28.9   Platelets 150 - 400 K/uL 194  191  267   NEUT# 1.7 - 7.7 K/uL 1.4  2.5  4.4   Lymph# 0.7 - 4.0 K/uL 0.5  0.2  0.2      There is no height or weight on file to calculate BMI.  Orders:  No orders of the defined types were placed in this encounter.  No orders of the defined types were placed in this encounter.    Procedures: No procedures performed  Clinical Data: No additional findings.  ROS:  All other systems negative, except as noted in the HPI. Review of Systems  Objective: Vital Signs: There were no vitals taken for this visit.  Specialty Comments:  No specialty comments available.  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  Past Surgical History:  Procedure Laterality Date   CHOLECYSTECTOMY     HARDWARE REMOVAL Right 12/27/2023   Procedure: REMOVAL, HARDWARE;  Surgeon: Genelle Standing, MD;  Location: [facility];  Service: Orthopedics;  Laterality: Right;  RIGHT DISTAL TIBIA REMOVAL OF HARDWARE   HERNIA REPAIR     IR GASTROSTOMY TUBE MOD SED  05/07/2023   IR GASTROSTOMY TUBE REMOVAL  08/20/2023   IR IMAGING GUIDED PORT INSERTION  05/07/2023   IR NASO G TUBE PLC W/FL W/RAD  05/07/2023   IR REMOVAL TUN ACCESS W/ PORT W/O FL MOD SED  12/10/2023   Social History   Occupational History   Not on file  Tobacco Use   Smoking status: Never   Smokeless tobacco: Not on file  Substance and Sexual Activity   Alcohol use: No   Drug use: No   Sexual activity: Not on file

## 2024-02-25 ENCOUNTER — Ambulatory Visit
Admission: RE | Admit: 2024-02-25 | Discharge: 2024-02-25 | Disposition: A | Discharge status: Home / Self Care | Source: Ambulatory Visit | Attending: Physician Assistant | Admitting: Physician Assistant

## 2024-02-25 DIAGNOSIS — M25511 Pain in right shoulder: Secondary | ICD-10-CM

## 2024-02-28 ENCOUNTER — Encounter: Payer: Self-pay | Admitting: Oncology

## 2024-03-07 ENCOUNTER — Ambulatory Visit (HOSPITAL_COMMUNITY)
Admission: RE | Admit: 2024-03-07 | Discharge: 2024-03-07 | Disposition: A | Source: Ambulatory Visit | Attending: Radiation Oncology | Admitting: Radiation Oncology

## 2024-03-07 ENCOUNTER — Encounter (HOSPITAL_COMMUNITY): Payer: Self-pay

## 2024-03-07 DIAGNOSIS — C024 Malignant neoplasm of lingual tonsil: Secondary | ICD-10-CM | POA: Insufficient documentation

## 2024-03-07 DIAGNOSIS — C099 Malignant neoplasm of tonsil, unspecified: Secondary | ICD-10-CM | POA: Diagnosis present

## 2024-03-07 MED ORDER — SODIUM CHLORIDE (PF) 0.9 % IJ SOLN
INTRAMUSCULAR | Status: AC
  Filled 2024-03-07: qty 50

## 2024-03-07 MED ORDER — IOHEXOL 300 MG/ML  SOLN
75.0000 mL | Freq: Once | INTRAMUSCULAR | Status: AC | PRN
  Administered 2024-03-07: 75 mL via INTRAVENOUS

## 2024-03-08 ENCOUNTER — Encounter (HOSPITAL_BASED_OUTPATIENT_CLINIC_OR_DEPARTMENT_OTHER): Payer: Self-pay

## 2024-03-08 ENCOUNTER — Ambulatory Visit (HOSPITAL_BASED_OUTPATIENT_CLINIC_OR_DEPARTMENT_OTHER): Payer: Self-pay | Admitting: Orthopaedic Surgery

## 2024-03-08 ENCOUNTER — Ambulatory Visit (INDEPENDENT_AMBULATORY_CARE_PROVIDER_SITE_OTHER): Admitting: Orthopaedic Surgery

## 2024-03-08 DIAGNOSIS — S46011A Strain of muscle(s) and tendon(s) of the rotator cuff of right shoulder, initial encounter: Secondary | ICD-10-CM

## 2024-03-08 NOTE — Progress Notes (Signed)
 Chief Complaint: Right shoulder pain     History of Present Illness:    Randy Kaufman is a 63 y.o. male presents now with multiple years of right shoulder pain with difficulty laying directly on the side.  He has been having pain with any type of activity that involves biceps flexion or overhead type of press.  He is very active but has been limited in terms of overhead activity as a result of his shoulder pain.  He does have a history of an acute injury of the right shoulder where he was lifting subsequently since that time has had a sharp decrease in strength as well as pain.  This is his dominant arm.  He has trialed physical therapy and strengthening on the side without any relief.    PMH/PSH/Family History/Social History/Meds/Allergies:    Past Medical History:  Diagnosis Date   Cancer (HCC)    neck and throat   Past Surgical History:  Procedure Laterality Date   CHOLECYSTECTOMY     HARDWARE REMOVAL Right 12/27/2023   Procedure: REMOVAL, HARDWARE;  Surgeon: Genelle Standing, MD;  Location: [facility];  Service: Orthopedics;  Laterality: Right;  RIGHT DISTAL TIBIA REMOVAL OF HARDWARE   HERNIA REPAIR     IR GASTROSTOMY TUBE MOD SED  05/07/2023   IR GASTROSTOMY TUBE REMOVAL  08/20/2023   IR IMAGING GUIDED PORT INSERTION  05/07/2023   IR NASO G TUBE PLC W/FL W/RAD  05/07/2023   IR REMOVAL TUN ACCESS W/ PORT W/O FL MOD SED  12/10/2023   Social History   Socioeconomic History   Marital status: Married    Spouse name: Not on file   Number of children: Not on file   Years of education: Not on file   Highest education level: Not on file  Occupational History   Not on file  Tobacco Use   Smoking status: Never   Smokeless tobacco: Not on file  Substance and Sexual Activity   Alcohol use: No   Drug use: No   Sexual activity: Not on file  Other Topics Concern   Not on file  Social History Narrative   Not on file   Social Drivers of Health   Financial  Resource Strain: Not on file  Food Insecurity: No Food Insecurity (04/20/2023)   Hunger Vital Sign    Worried About Running Out of Food in the Last Year: Never true    Ran Out of Food in the Last Year: Never true  Transportation Needs: No Transportation Needs (04/20/2023)   PRAPARE - Administrator, Civil Service (Medical): No    Lack of Transportation (Non-Medical): No  Physical Activity: Not on file  Stress: Not on file  Social Connections: Not on file   No family history on file. Allergies  Allergen Reactions   Sulfa  Antibiotics Rash   Current Outpatient Medications  Medication Sig Dispense Refill   ascorbic acid (VITAMIN C) 1000 MG tablet Take 1,000 mg by mouth daily.     finasteride (PROSCAR) 5 MG tablet Take 5 mg by mouth daily. Takes 1/2 Tab to equal 2.5 mg daily     Multiple Vitamin (MULTI-VITAMIN) tablet Take 1 tablet by mouth daily.     TRUE MAGNESIUM  OXIDE 400 MG tablet TAKE 1 TABLET BY MOUTH EVERY DAY 90 tablet 1   No current facility-administered medications for this visit.   CT Chest W Contrast Result Date: 03/07/2024 EXAM: CT CHEST WITH CONTRAST 03/07/2024 09:03:41 AM TECHNIQUE:  CT of the chest was performed with the administration of 75 mL of iohexol  (OMNIPAQUE ) 300 MG/ML solution. Multiplanar reformatted images are provided for review. Automated exposure control, iterative reconstruction, and/or weight based adjustment of the mA/kV was utilized to reduce the radiation dose to as low as reasonably achievable. COMPARISON: PET of 09/27/2023. CLINICAL HISTORY: Head/neck cancer, monitor. * Tracking Code: BO * FINDINGS: MEDIASTINUM: No mediastinal or hilar adenopathy. Normal heart size, without pericardial effusion. Normal aortic caliber. Aortic atherosclerosis. Mild cardiomegaly. LAD coronary artery calcification. The central airways are clear. LYMPH NODES: No mediastinal, hilar or axillary lymphadenopathy. LUNGS AND PLEURA: No pneumothorax or pleural fluid. Tiny left  lower lobe calcified granuloma. No pulmonary metastasis. No central pulmonary embolism on this non-dedicated exam. SOFT TISSUES/BONES: Old  left rib fractures. Mid and lower thoracic spondylosis. UPPER ABDOMEN: Cholecystectomy. Punctate right renal collecting system calculi. Bilateral adrenal nodularity, greater right than left. Example on the right at 1.7 cm on image 63/3. relatively similar to the prior PETs back to 04/15/2023. IMPRESSION: 1. No acute findings or evidence of metastatic disease in the chest. 2. 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 Recommend attention on follow-up. 3. Aortic atherosclerosis (ICD-10 I70.0). 4. Coronary artery atherosclerosis. 5. Right nephrolithiasis. Electronically signed by: [name] 03/07/2024 01:55 PM EST RP Workstation: [id number]   CT Soft Tissue Neck W Contrast Result Date: 03/07/2024 EXAM: CT NECK WITH CONTRAST 03/07/2024 09:03:41 AM TECHNIQUE: CT of the neck was performed with the administration of 75 mL of iohexol  (OMNIPAQUE ) 300 MG/ML solution. Multiplanar reformatted images are provided for review. Automated exposure control, iterative reconstruction, and/or weight based adjustment of the mA/kV was utilized to reduce the radiation dose to as low as reasonably achievable. COMPARISON: Outside neck CT 03/17/2023. PET CT 04/15/2023. Dedicated chest CT 03/07/2024 reported separately. CLINICAL HISTORY: 63 year old male with a history of right tonsillar carcinoma, treatment completed in March this year, restaging. FINDINGS: AERODIGESTIVE TRACT: Generalized asymmetric pharyngeal mucosal space soft tissue thickening appears post treatment related, with abundant involvement of the right hypopharynx on series 2 image 61. Effaced right piriform sinus. Otherwise mild asymmetry of the larynx. Mild asymmetry of the right tonsillar pillar as regressed but persists on series 2 image 35. No  confluent enhancement there. Negative parapharyngeal spaces. Trace retrofibroid effusion. Negative sublingual space. SALIVARY GLANDS: Post treatment changes to both submandibular glands which are mildly atrophied. Asymmetric right submandibular space soft tissue thickening / edema. Parotid glands appear stable and negative. THYROID : Stable and negative thyroid . Nirads category 1. LYMPH NODES: Regressed partially cystic / necrotic right dominant right cervical lymph node at the junction of the right level 2a and 3a stations. This is now 13 mm long axis on series 2 image 58, versus 30 mm there on the prior outside neck CT. The lesion also demonstrates faint calcification now. No new or increased cervical lymphadenopathy. BRAIN, ORBITS, SINUSES AND MASTOIDS: No acute abnormality. UPPER CHEST: Dedicated chest CT today is reported separately. BONES: Ordinary lower cervical spine disc and endplate degeneration. No acute or suspicious osseous lesion identified in the Neck. VASCULATURE: Major vascular structures in the bilateral neck and at the skull base are patent. Asymmetric mild soft and calcified atherosclerosis at the right carotid bifurcation. IMPRESSION: 1. Post-treatment changes in the right neck with no adverse features. NI-RADS category 1. 2. Chest CT today is reported separately. Electronically signed by: Helayne Hurst MD 03/07/2024 10:32 AM EST RP Workstation: [id number]  Review of Systems:   A ROS was performed including pertinent positives and negatives as documented in the HPI.  Physical Exam :   Constitutional: NAD and appears stated age Neurological: Alert and oriented Psych: Appropriate affect and cooperative There were no vitals taken for this visit.   Comprehensive Musculoskeletal Exam:    Right shoulder with weakness in forward elevation to 160 degrees.  He has a positive Neer impingement with empty can sign.  External rotation at the side is to 20 degrees on the right compared to 60 on  the left.  Internal rotation is to L3 on the right compared to L1 on the right distal neurosensory exam is intact   Imaging:   Xray (3 views right shoulder): Normal  MRI (right shoulder): Full-thickness supraspinatus tear with retraction to the level of the glenoid.  Tellier grade 1 changes on the sagittal MRI   I personally reviewed and interpreted the radiographs.   Assessment and Plan:   63 y.o. male with right full-thickness rotator cuff tear with retraction to the glenoid.  At this time I did discuss that given the retraction he would be a candidate for repair but I do ultimately believe that this may not be the case if not intervened upon.  At this time he is having difficulty with overhead motion and staying active as a result.  Given this we did discuss treatment options.  He has trialed strengthening without relief.  I did discuss ultimately that he may be a candidate for right shoulder rotator cuff repair.  I did discuss wrist limitation as well as associated recovery timeframe.  After discussion he would like to proceed  -Plan for right shoulder arthroscopy with rotator cuff repair and biceps tenodesis   After a lengthy discussion of treatment options, including risks, benefits, alternatives, complications of surgical and nonsurgical conservative options, the patient elected surgical repair.   The patient  is aware of the material risks  and complications including, but not limited to injury to adjacent structures, neurovascular injury, infection, numbness, bleeding, implant failure, thermal burns, stiffness, persistent pain, failure to heal, disease transmission from allograft, need for further surgery, dislocation, anesthetic risks, blood clots, risks of death,and others. The probabilities of surgical success and failure discussed with patient given their particular co-morbidities.The time and nature of expected rehabilitation and recovery was discussed.The patient's questions were  all answered preoperatively.  No barriers to understanding were noted. I explained the natural history of the disease process and Rx rationale.  I explained to the patient what I considered to be reasonable expectations given their personal situation.  The final treatment plan was arrived at through a shared patient decision making process model.    I personally saw and evaluated the patient, and participated in the management and treatment plan.  Elspeth Parker, MD Attending Physician, Orthopedic Surgery  This document was dictated using Dragon voice recognition software. A reasonable attempt at proof reading has been made to minimize errors.

## 2024-03-09 NOTE — Progress Notes (Incomplete)
 Mr. Randy Kaufman presents to the clinic today for a follow up appointment for lingual tonsil carcinoma.    Pain issues, if any: None Using a feeding tube?: None Weight changes, if any: Weight is stable  Swallowing issues, if any: None Smoking or chewing tobacco? None Using fluoride toothpaste daily? Yes Last ENT visit was on: November 2025 Other notable issues, if any:  Voice hoarseness  BP 138/68 (BP Location: Right Arm, Patient Position: Sitting)   Pulse 68   Temp 97.8 F (36.6 C)   Resp 18   Ht 6' (1.829 m)   Wt 171 lb 3.2 oz (77.7 kg)   SpO2 100%   BMI 23.22 kg/m     Wt Readings from Last 3 Encounters:  03/13/24 171 lb 3.2 oz (77.7 kg)  02/10/24 166 lb (75.3 kg)  12/27/23 164 lb 14.5 oz (74.8 kg)

## 2024-03-13 ENCOUNTER — Ambulatory Visit
Admission: RE | Admit: 2024-03-13 | Discharge: 2024-03-13 | Attending: Radiation Oncology | Admitting: Radiation Oncology

## 2024-03-13 ENCOUNTER — Other Ambulatory Visit: Payer: Self-pay

## 2024-03-13 VITALS — BP 138/68 | HR 68 | Temp 97.8°F | Resp 18 | Ht 72.0 in | Wt 171.2 lb

## 2024-03-13 DIAGNOSIS — C024 Malignant neoplasm of lingual tonsil: Secondary | ICD-10-CM

## 2024-03-14 ENCOUNTER — Other Ambulatory Visit: Payer: Self-pay

## 2024-03-16 ENCOUNTER — Ambulatory Visit: Payer: Self-pay

## 2024-03-22 ENCOUNTER — Ambulatory Visit (HOSPITAL_BASED_OUTPATIENT_CLINIC_OR_DEPARTMENT_OTHER): Admitting: Orthopaedic Surgery

## 2024-04-12 ENCOUNTER — Telehealth: Payer: Self-pay | Admitting: Orthopaedic Surgery

## 2024-04-12 ENCOUNTER — Ambulatory Visit (HOSPITAL_BASED_OUTPATIENT_CLINIC_OR_DEPARTMENT_OTHER): Admitting: Orthopaedic Surgery

## 2024-04-12 NOTE — Telephone Encounter (Signed)
 FYI: Received vm from [facility] with [facility] asking if we received clearance for surgery, callback [phone]. IC, spoke with receptionist and advised that I received this VM from [place]-she stated she sees where that message was left. I advised we did NOT receive the clearance to please re-fax. She stated she would message [place] to refax to [phone].

## 2024-04-18 NOTE — Addendum Note (Signed)
 Encounter addended by: Sylina Henion L on: 04/18/2024 10:14 AM  Actions taken: Imaging Exam ended

## 2024-04-24 ENCOUNTER — Telehealth (HOSPITAL_BASED_OUTPATIENT_CLINIC_OR_DEPARTMENT_OTHER): Payer: Self-pay | Admitting: Physical Therapy

## 2024-04-24 NOTE — Telephone Encounter (Signed)
 Called and spoke to pt- he was agreeable to rescheduling PT appt from 2/17 to 2/20. We also discussed PT recommendations post-op in regards to computer/desk work- advised him that it can vary from pt to pt, some do return to computer work quickly with surgical UE well supported in sling without issue, others need a bit more time. Also discussed 6 week mark after surgery typically being transition to more resistance work in PT given this type of surgery, based on general tissue healing time frames. Encouraged him to discuss return to work timeline with careers adviser as well.  Josette Rough, PT, DPT 04/24/24 11:26 AM

## 2024-05-04 ENCOUNTER — Encounter (HOSPITAL_BASED_OUTPATIENT_CLINIC_OR_DEPARTMENT_OTHER): Payer: Self-pay | Admitting: Orthopaedic Surgery

## 2024-05-04 ENCOUNTER — Other Ambulatory Visit: Payer: Self-pay | Admitting: Orthopaedic Surgery

## 2024-05-04 DIAGNOSIS — S46011A Strain of muscle(s) and tendon(s) of the rotator cuff of right shoulder, initial encounter: Secondary | ICD-10-CM | POA: Diagnosis not present

## 2024-05-04 MED ORDER — OXYCODONE HCL 5 MG PO TABS: 5.0000 mg | ORAL_TABLET | Freq: Four times a day (QID) | ORAL | 0 refills | Status: AC | PRN

## 2024-05-04 NOTE — Progress Notes (Signed)
 "  Date of Surgery: 05/04/2024  INDICATIONS: Randy Kaufman is a 64 y.o.-year-old male with right shoulder massive rotator cuff tear.  The risk and benefits of the procedure were discussed in detail and documented in the pre-operative evaluation.   PREOPERATIVE DIAGNOSES: Right shoulder, chronic rotator cuff tear.  POSTOPERATIVE DIAGNOSIS: Same.  PROCEDURE: Arthroscopic limited debridement - 70177 Arthroscopic subacromial decompression - 70173 Arthroscopic rotator cuff repair - 70172 Arthroscopic biceps tenodesis - 70171  SURGEON: Randy LITTIE Parker MD  ASSISTANT: Conley Funk, ATC  ANESTHESIA:  general  IV FLUIDS AND URINE: See anesthesia record.  ANTIBIOTICS: Ancef   ESTIMATED BLOOD LOSS: 5 mL.  IMPLANTS:  * No surgical log found *  DRAINS: None  CULTURES: None  COMPLICATIONS: none  PROCEDURE:    OPERATIVE FINDING: Exam under anesthesia:   Examination under anesthesia revealed forward elevation of 150 degrees.  With the arm at the side, there was 65 degrees of external rotation.  There is a 1+ anterior load shift and a 1+ posterior load shift.    Arthroscopic findings demonstrated: Articular space: Rotator interval normal Chondral surfaces: Normal Biceps: Fraying tearing Subscapularis: Intact Supraspinatus: Massive tear with retraction past the glenoid Infraspinatus: Torn and retracted to the glenoid    I identified the patient in the pre-operative holding area.  I marked the operative right shoulder with my initials. I reviewed the risks and benefits of the proposed surgical intervention and the patient wished to proceed.  Anesthesia was then performed with regional block.  The patient was transferred to the operative suite and placed in the beach chair position with all bony prominences padded.     SCDs were placed on bilateral lower extremity. Appropriate antibiotics was administered within 1 hour before incision.  Anesthesia was induced.  The operative  extremity was then prepped and draped in standard fashion. A time out was performed confirming the correct extremity, correct patient and correct procedure.   The arthroscope was introduced in the glenohumeral joint from a posterior portal.  An anterior portal was created.  The shoulder was examined and the above findings were noted.     With an arthroscopic shaver and a wand ablator, synovitis throughout the  shoulder was resected.  The arthroscopic shaver was used to excise torn portions of the labrum back to a stable margin. Specifically this was done for the anterior and superior labrum. The rotator interval was debrided using shaver and electrocautery.  At this time the biceps was tagged with a self passing device with a #2 nonabsorbable suture twice.  This was done through the anterior portal.  This was placed in a single 2.9 Quattro link anchor.   Through visualization from intra-articular, the footprint of the rotator cuff was debrided of soft tissues back to bleeding bone.  This was done with an arthroscopic shaver.     The rotator cuff was then approached through the subacromial space. Anterior, lateral, and posterior portals were used.  Bursectomy was performed with an arthroscopic shaver and ArthroCare wand.  The soft tissues and 1 mm of bone on the undersurface of the acromion and clavicle were resected with the arthroscopic shaver and wand.   The rotator cuff was repaired with a double row configuration with two medial row all suture suture anchors as noted above with sutures passed from anterior to posterior in horizontal fashion with a self passing suture device.  The posterior 2 sutures were tied with knots in order to ultimately reapproximate the tendon to its anatomic footprint. A  total of 8 limbs of suture were passed.  These were advanced to approximately achieved 60% posterior footprint coverage.  The sutures were placed with a single lateral row anchor.  At this time the anterior  medial row for collagen sutures were brought into an anchor that was placed into the superior portion of the glenoid.  This was used to achieve a suture superior capsular reconstruction and these were subsequently brought back again into an anterior anchor creating a an 8 limb stand superior reconstruction.   The shoulder was irrigated.  The arthroscopic instruments were removed.  Wounds were closed with 3-0 nylon sutures.  A sterile dressing was applied with xeroform, 4x8s, abdominal pad, and tape. An Rosalita was placed and the upper extremity was placed in a shoulder immobilizer.  The patient tolerated the procedure well and was taken to the recovery room in stable condition.  All counts were correct in the case. The patient tolerated the procedure well and was taken to the recovery room in stable condition.    POSTOPERATIVE PLAN: He will follow the rotator cuff protocol.  He will be placed on aspirin for blood clot prevention.  Randy LITTIE Parker, MD 5:51 PM  "

## 2024-05-09 ENCOUNTER — Other Ambulatory Visit: Payer: Self-pay

## 2024-05-09 ENCOUNTER — Ambulatory Visit (HOSPITAL_BASED_OUTPATIENT_CLINIC_OR_DEPARTMENT_OTHER): Admitting: Physical Therapy

## 2024-05-09 ENCOUNTER — Encounter (HOSPITAL_BASED_OUTPATIENT_CLINIC_OR_DEPARTMENT_OTHER): Payer: Self-pay | Admitting: Physical Therapy

## 2024-05-09 DIAGNOSIS — M25511 Pain in right shoulder: Secondary | ICD-10-CM

## 2024-05-09 DIAGNOSIS — M6281 Muscle weakness (generalized): Secondary | ICD-10-CM

## 2024-05-09 DIAGNOSIS — M25611 Stiffness of right shoulder, not elsewhere classified: Secondary | ICD-10-CM

## 2024-05-09 DIAGNOSIS — R293 Abnormal posture: Secondary | ICD-10-CM

## 2024-05-09 DIAGNOSIS — R600 Localized edema: Secondary | ICD-10-CM

## 2024-05-16 ENCOUNTER — Encounter (HOSPITAL_BASED_OUTPATIENT_CLINIC_OR_DEPARTMENT_OTHER): Payer: Self-pay | Admitting: Physical Therapy

## 2024-05-18 ENCOUNTER — Encounter (HOSPITAL_BASED_OUTPATIENT_CLINIC_OR_DEPARTMENT_OTHER): Payer: Self-pay

## 2024-05-18 ENCOUNTER — Encounter (HOSPITAL_BASED_OUTPATIENT_CLINIC_OR_DEPARTMENT_OTHER): Admitting: Orthopaedic Surgery

## 2024-05-23 ENCOUNTER — Encounter (HOSPITAL_BASED_OUTPATIENT_CLINIC_OR_DEPARTMENT_OTHER): Payer: Self-pay | Admitting: Physical Therapy

## 2024-05-26 ENCOUNTER — Encounter (HOSPITAL_BASED_OUTPATIENT_CLINIC_OR_DEPARTMENT_OTHER): Payer: Self-pay | Admitting: Physical Therapy

## 2024-05-30 ENCOUNTER — Encounter (HOSPITAL_BASED_OUTPATIENT_CLINIC_OR_DEPARTMENT_OTHER): Payer: Self-pay

## 2024-06-06 ENCOUNTER — Encounter (HOSPITAL_BASED_OUTPATIENT_CLINIC_OR_DEPARTMENT_OTHER): Payer: Self-pay | Admitting: Physical Therapy

## 2024-06-08 ENCOUNTER — Encounter (HOSPITAL_BASED_OUTPATIENT_CLINIC_OR_DEPARTMENT_OTHER): Payer: Self-pay

## 2024-06-09 ENCOUNTER — Inpatient Hospital Stay: Attending: Oncology

## 2024-06-09 ENCOUNTER — Inpatient Hospital Stay: Admitting: Oncology

## 2024-06-13 ENCOUNTER — Encounter (HOSPITAL_BASED_OUTPATIENT_CLINIC_OR_DEPARTMENT_OTHER): Payer: Self-pay | Admitting: Physical Therapy

## 2024-06-15 ENCOUNTER — Encounter (HOSPITAL_BASED_OUTPATIENT_CLINIC_OR_DEPARTMENT_OTHER): Payer: Self-pay | Admitting: Physical Therapy

## 2024-06-20 ENCOUNTER — Encounter (HOSPITAL_BASED_OUTPATIENT_CLINIC_OR_DEPARTMENT_OTHER): Payer: Self-pay

## 2024-06-22 ENCOUNTER — Encounter (HOSPITAL_BASED_OUTPATIENT_CLINIC_OR_DEPARTMENT_OTHER): Payer: Self-pay | Admitting: Physical Therapy

## 2025-02-20 ENCOUNTER — Ambulatory Visit: Admitting: Radiology
